# Patient Record
Sex: Male | Born: 1955
Health system: Southern US, Community
[De-identification: ages and names within clinical notes are randomized; demographics above are authoritative.]

## PROBLEM LIST (undated history)

## (undated) DIAGNOSIS — K219 Gastro-esophageal reflux disease without esophagitis: Secondary | ICD-10-CM

## (undated) DIAGNOSIS — I7121 Aneurysm of the ascending aorta, without rupture: Secondary | ICD-10-CM

## (undated) DIAGNOSIS — I712 Thoracic aortic aneurysm, without rupture: Secondary | ICD-10-CM

## (undated) DIAGNOSIS — T7840XA Allergy, unspecified, initial encounter: Secondary | ICD-10-CM

## (undated) DIAGNOSIS — I10 Essential (primary) hypertension: Secondary | ICD-10-CM

## (undated) DIAGNOSIS — F419 Anxiety disorder, unspecified: Secondary | ICD-10-CM

## (undated) DIAGNOSIS — I493 Ventricular premature depolarization: Secondary | ICD-10-CM

## (undated) DIAGNOSIS — R7989 Other specified abnormal findings of blood chemistry: Secondary | ICD-10-CM

## (undated) HISTORY — DX: Gastro-esophageal reflux disease without esophagitis: K21.9

## (undated) HISTORY — DX: Aneurysm of the ascending aorta, without rupture: I71.21

## (undated) HISTORY — DX: Other specified abnormal findings of blood chemistry: R79.89

## (undated) HISTORY — DX: Allergy, unspecified, initial encounter: T78.40XA

## (undated) HISTORY — DX: Ventricular premature depolarization: I49.3

## (undated) HISTORY — DX: Thoracic aortic aneurysm, without rupture: I71.2

## (undated) HISTORY — PX: OTHER SURGICAL HISTORY: SHX169

## (undated) HISTORY — DX: Essential (primary) hypertension: I10

## (undated) HISTORY — DX: Anxiety disorder, unspecified: F41.9

## (undated) HISTORY — PX: CHOLECYSTECTOMY: SHX55

---

## 2003-11-02 ENCOUNTER — Emergency Department (HOSPITAL_COMMUNITY): Admission: EM | Admit: 2003-11-02 | Discharge: 2003-11-03 | Payer: Self-pay | Admitting: Emergency Medicine

## 2004-09-01 ENCOUNTER — Emergency Department (HOSPITAL_COMMUNITY): Admission: EM | Admit: 2004-09-01 | Discharge: 2004-09-01 | Payer: Self-pay | Admitting: Emergency Medicine

## 2004-10-16 ENCOUNTER — Emergency Department (HOSPITAL_COMMUNITY): Admission: EM | Admit: 2004-10-16 | Discharge: 2004-10-17 | Payer: Self-pay | Admitting: Emergency Medicine

## 2004-10-21 ENCOUNTER — Encounter: Admission: RE | Admit: 2004-10-21 | Discharge: 2004-10-21 | Payer: Self-pay | Admitting: Family Medicine

## 2004-11-04 ENCOUNTER — Observation Stay (HOSPITAL_COMMUNITY): Admission: RE | Admit: 2004-11-04 | Discharge: 2004-11-05 | Payer: Self-pay | Admitting: General Surgery

## 2006-12-24 IMAGING — CT CT ABDOMEN WO/W CM
1 of 5 series · 12 of 32 positions shown, 18 images · IV contrast (REDICAT/H/2O & OMNIPAQUE  [ID])
Comparison: none

CLINICAL DATA: 49 year old with liver lesion seen on recent ultrasound.
ABDOMEN CT WITHOUT AND WITH CONTRAST:
TECHNIQUE: Multidetector CT imaging of the abdomen was performed both before and during bolus administration of intravenous contrast.
Contrast:  100 cc of Omnipaque 300.
Precontrast, post contrast arterial phase, and portal venous phase imaging was performed of the abdomen.   Study is correlated with the ultrasound examination of 10/16/04.
On the precontrast images, there are three or four small low attenuation lesions in the liver.  These demonstrate no contrast enhancement and are consistent with benign hepatic cysts.  In the region of abnormality on ultrasound, I do not see any abnormality on the precontrast images.  On the arterial phase, there is a blush of contrast enhancement in this area which is somewhat ill-defined.  I think it is faintly persistent on the portal venous phase images.  It may represent flash filling of the hemangioma.  It is fairly well seen on the ultrasound and, at this point, I would just recommend a follow up ultrasound examination in 4-6 months to document stability. The gallstones seen on the ultrasound are not well demonstrated on the CT.  There is no evidence for pericholecystic inflammatory change.  There is a hyperdense cyst associated with the upper pole region of the right kidney.  It measures approximately 30 Hounsfield units on the precontrast study and 30 Hounsfield units on the arterial and portal venous phase images.  Looking back at the ultrasound, this is difficult to see.
The pancreas, adrenal glands, spleen, and left kidney are unremarkable.  No abdominal masses or adenopathy. The stomach, duodenum, small bowel, and colon demonstrate no significant abnormalities.

[Series 3: abd w/cm · axial · 0.70mm/px · z∈[-270,+0]mm · 12 of 103 slices shown, 18 images]
[im 8/103  soft-tissue]
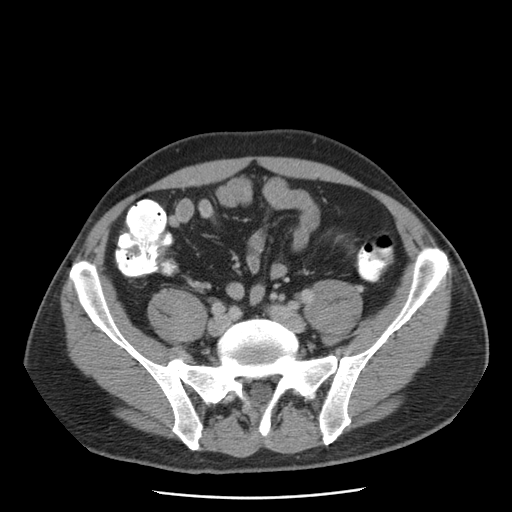
[im 8/103  bone]
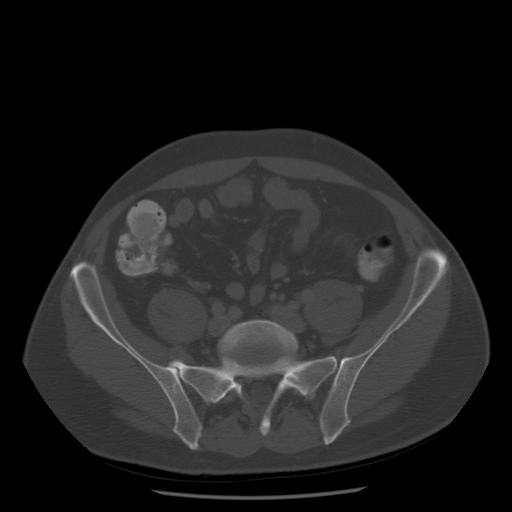
[im 16/103  soft-tissue]
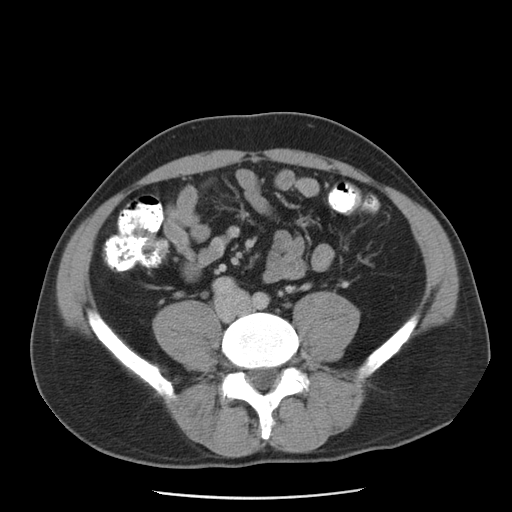
[im 24/103  soft-tissue]
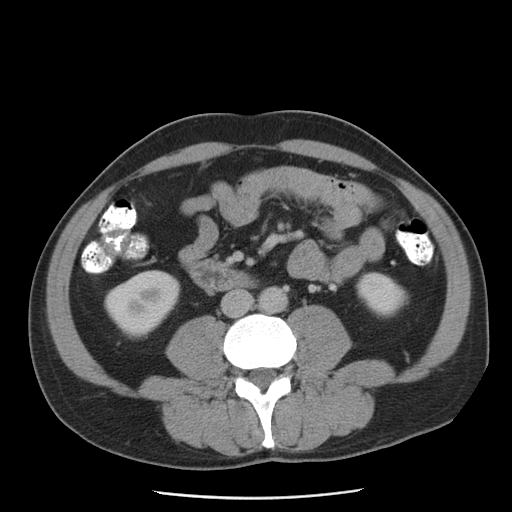
[im 32/103  soft-tissue]
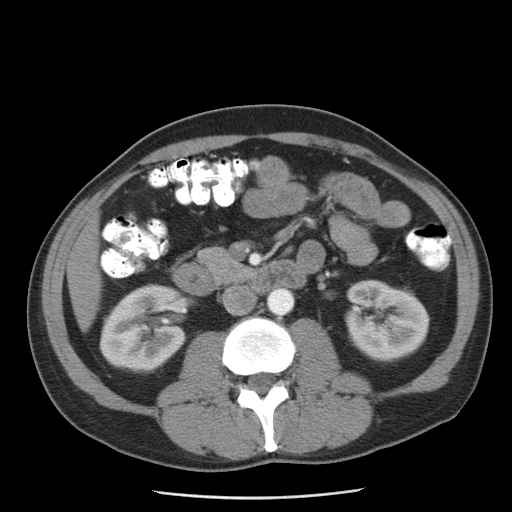
[im 40/103  soft-tissue]
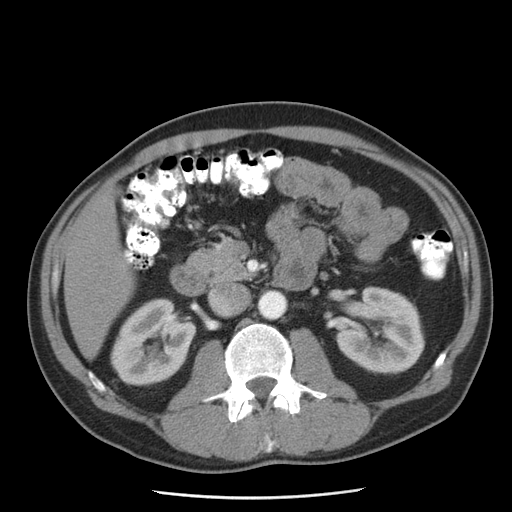
[im 48/103  soft-tissue]
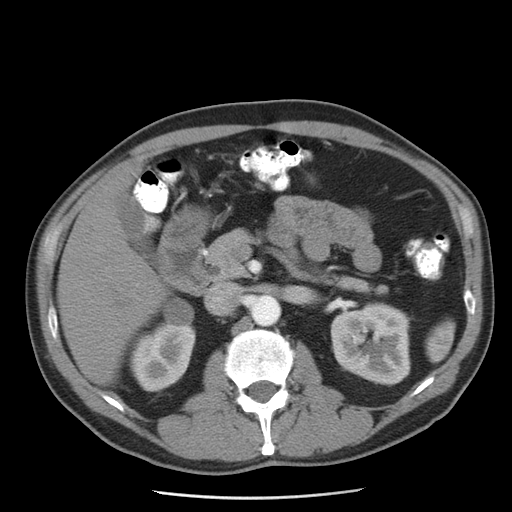
[im 55/103  soft-tissue]
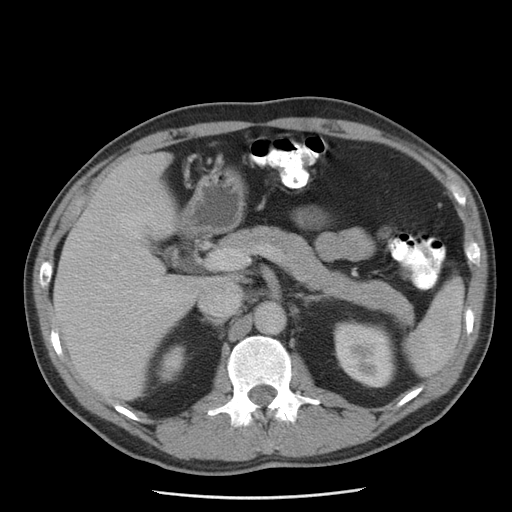
[im 63/103  soft-tissue]
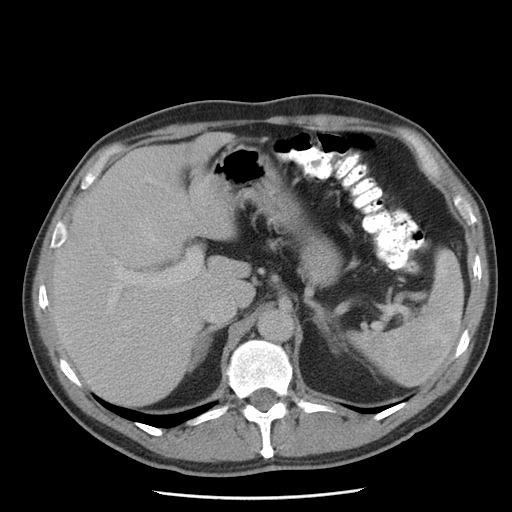
[im 71/103  soft-tissue]
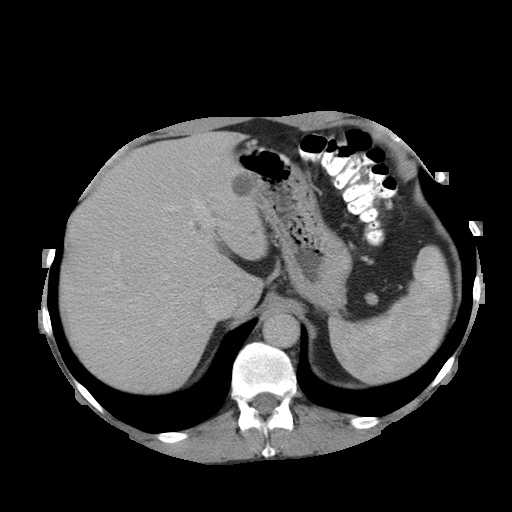
[im 71/103  lung]
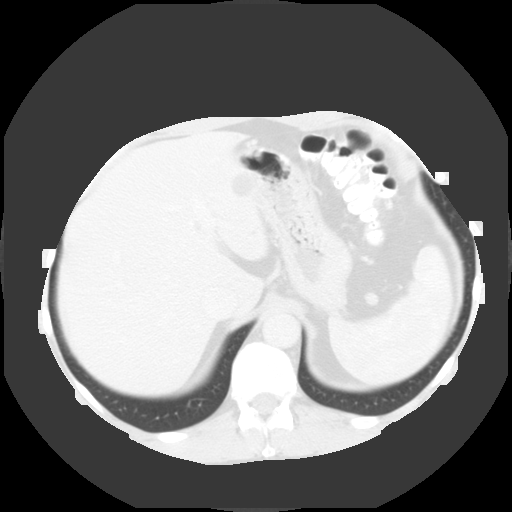
[im 71/103  bone]
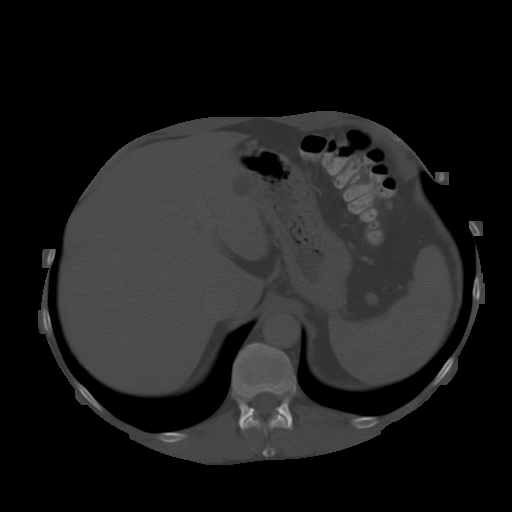
[im 79/103  soft-tissue]
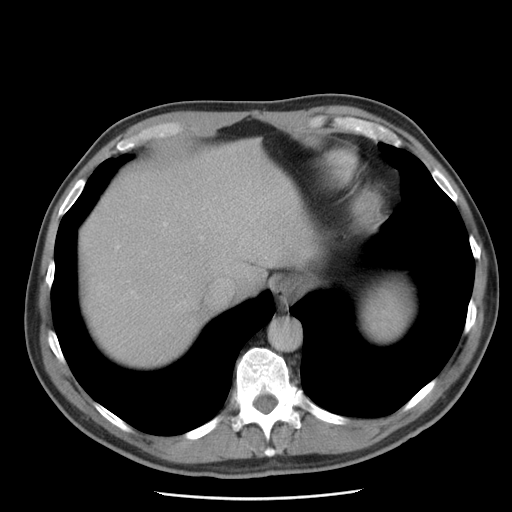
[im 79/103  lung]
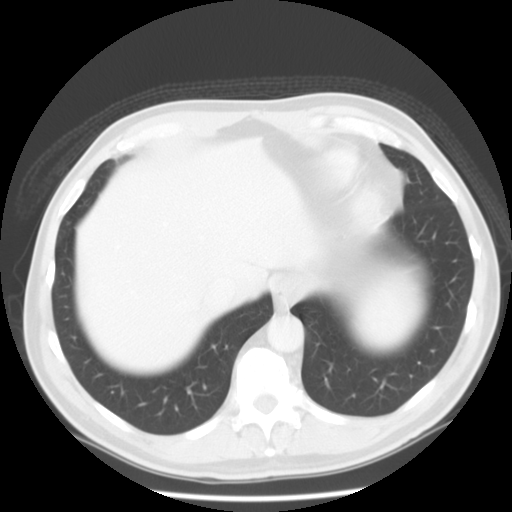
[im 87/103  soft-tissue]
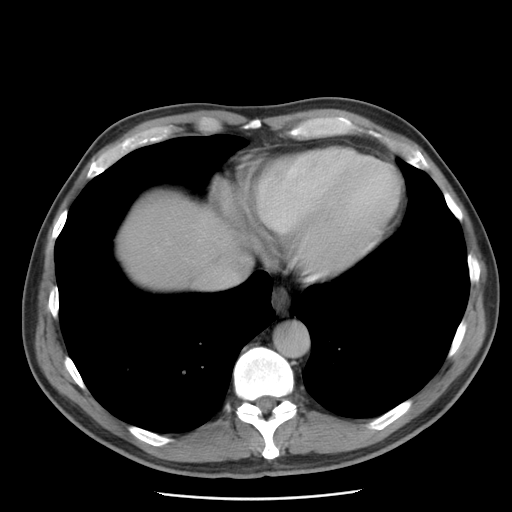
[im 87/103  lung]
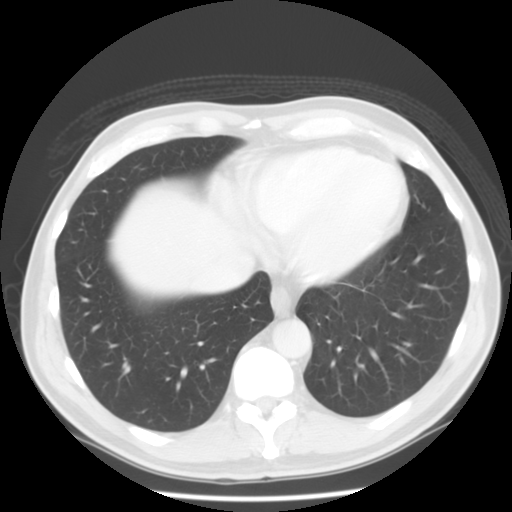
[im 95/103  soft-tissue]
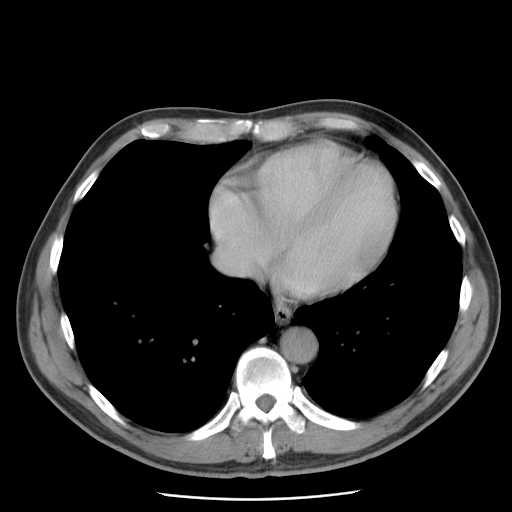
[im 95/103  lung]
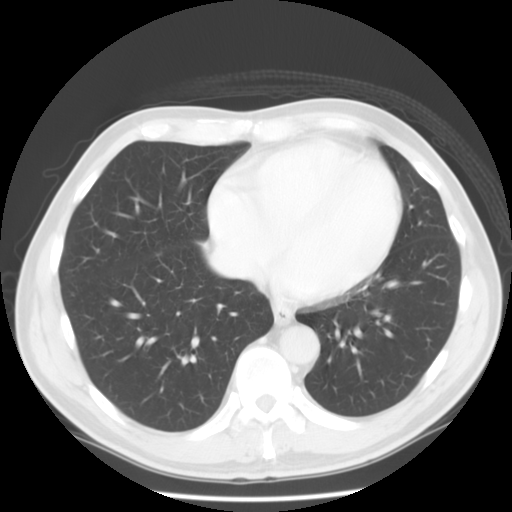

[12 of 32 positions shown; findings below may reference images not displayed]

IMPRESSION: 1.  Vague area of arterial enhancement in the same region of the small lesion seen on ultrasound. This may represent flash filling of a hemangioma.  I would recommend a follow up ultrasound examination of 4-6 months to document stability.  There are also at least five or six small hepatic cysts.
2.  Hyperdense/hemorrhagic cyst associated with the upper pole region of the right kidney.
3.  No other significant findings in the abdomen.

## 2015-09-01 ENCOUNTER — Encounter: Payer: Self-pay | Admitting: Cardiology

## 2015-09-01 ENCOUNTER — Ambulatory Visit (INDEPENDENT_AMBULATORY_CARE_PROVIDER_SITE_OTHER): Payer: Managed Care, Other (non HMO) | Admitting: Cardiology

## 2015-09-01 ENCOUNTER — Encounter: Payer: Self-pay | Admitting: *Deleted

## 2015-09-01 VITALS — BP 100/64 | HR 63 | Ht 71.0 in | Wt 175.0 lb

## 2015-09-01 DIAGNOSIS — Z136 Encounter for screening for cardiovascular disorders: Secondary | ICD-10-CM

## 2015-09-01 DIAGNOSIS — I77810 Thoracic aortic ectasia: Secondary | ICD-10-CM | POA: Diagnosis not present

## 2015-09-01 DIAGNOSIS — E785 Hyperlipidemia, unspecified: Secondary | ICD-10-CM

## 2015-09-01 DIAGNOSIS — I10 Essential (primary) hypertension: Secondary | ICD-10-CM

## 2015-09-01 DIAGNOSIS — I712 Thoracic aortic aneurysm, without rupture: Secondary | ICD-10-CM

## 2015-09-01 DIAGNOSIS — I7121 Aneurysm of the ascending aorta, without rupture: Secondary | ICD-10-CM

## 2015-09-01 NOTE — Patient Instructions (Signed)
Medication Instructions:  Continue all current medications.  Labwork: NONE  Testing/Procedures: Your physician has requested that you have an echocardiogram. Echocardiography is a painless test that uses sound waves to create images of your heart. It provides your doctor with information about the size and shape of your heart and how well your heart's chambers and valves are working. This procedure takes approximately one hour. There are no restrictions for this procedure. - DUE JUST PRIOR TO NEXT OFFICE VISIT.   Follow-Up: Your physician wants you to follow up in: 6 months.  You will receive a reminder letter in the mail one-two months in advance.  If you don't receive a letter, please call our office to schedule the follow up appointment    If you need a refill on your cardiac medications before your next appointment, please call your pharmacy.

## 2015-09-01 NOTE — Progress Notes (Signed)
Cardiology Office Note  Date: 09/01/2015   ID: Tim HeadingsJames C Ruby, DOB [birth year], MRN [id number]  PCP: Ernestine ConradBLUTH, KIRK, MD  Consulting Cardiologist: Nona DellSamuel Adya Wirz, MD   Chief Complaint  Patient presents with  . Ascending aortic aneurysm    History of Present Illness: Tim Rogers is a [age] y.o. male referred for cardiology consultation by Dr. Loney HeringBluth. [place] from [place] back in [place]. [place]. He has a history of aortic root and ascending aortic dilatation and was following with a cardiologist in [place] for the last several years.  Patient previously followed with Dr. Katharina CaperAndre Landau of [facility] in [place], [place]. He has a history of ascending aortic root dilatation and aneurysmal change, echocardiogram reporting 4.63 cm at the Sinuses of Valsalva and 4.5 cm in the ascending aorta as of December 2015. He reportedly had a chest CT in November 2016, I do not have these results for review.  He presents today to establish ongoing cardiology follow-up. Reports no symptoms with exertion, specifically no angina, NYHA class I dyspnea, no palpitations or syncope. He states that he is very active doing chores [place] his [place], enjoys walking for exercise. He does not lift weights for exercise since his diagnosis of aortic root dilatation. Tries to manage stress and keep his blood pressure down.  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  He reports stable medical regimen, has been on Norvasc for about 5 years. ECG today reviewed and shows normal sinus rhythm.  It sounds like he has been having echocardiograms alternating with chest CT for follow-up surveillance of his aortic size.  Past Medical History  Diagnosis Date  . Essential hypertension   . Hyperlipidemia   . Ascending aortic aneurysm (HCC)   .  Low testosterone   . PVCs (premature ventricular contractions)   . Anxiety     Past Surgical History  Procedure Laterality Date  . Cholecystectomy      Current Outpatient Prescriptions  Medication Sig Dispense Refill  . amLODipine (NORVASC) 10 MG tablet Take 10 mg by mouth daily.    . pantoprazole (PROTONIX) 20 MG tablet Take 20 mg by mouth daily.     No current facility-administered medications for this visit.   Allergies:  Review of patient's allergies indicates no known allergies.   Social History: The patient  reports that he has never smoked. He has never used smokeless tobacco. He reports that he drinks alcohol. He reports that he does not use illicit drugs.   Family History: The patient's family history includes COPD in his father.   ROS:  Please see the history of present illness. Otherwise, complete review of systems is positive for none.  All other systems are reviewed and negative.   Physical Exam: VS:  BP 100/64 mmHg  Pulse 63  Ht 5\' 11"  (1.803 m)  Wt 175 lb (79.379 kg)  BMI 24.42 kg/m2  SpO2 96%, BMI Body mass index is 24.42 kg/(m^2).  Wt Readings from Last 3 Encounters:  09/01/15 175 lb (79.379 kg)  02/01/15 182 lb (82.555 kg)    General: Patient appears comfortable at rest. HEENT: Conjunctiva and lids normal, oropharynx clear. Neck: Supple, no elevated JVP or carotid bruits, no thyromegaly. Lungs: Clear to auscultation, nonlabored breathing at rest. Cardiac: Regular rate and rhythm, no S3 or significant systolic murmur, no pericardial rub. Abdomen:  Soft, nontender, bowel sounds present, no guarding or rebound. Extremities: No pitting edema, distal pulses 2+. Skin: Warm and dry. Musculoskeletal: No kyphosis. Neuropsychiatric: Alert and oriented x3, affect grossly appropriate.  ECG: I personally reviewed the tracing from 01/30/2014 which showed normal sinus rhythm with nonspecific T-wave changes.  Recent Labwork:  June 2016: BUN 14, creatinine 1.1,  potassium 4.3, AST 21, ALT 20, hemoglobin 14.9, platelet is 215  Other Studies Reviewed Today:  Echocardiogram 02/06/2014 [facility]([place]): Mild basal septal LV hypertrophy with LVEF 60%, abnormal diastolic dysfunction, mild mitral regurgitation, mildly sclerotic aortic valve with mild aortic regurgitation, mild tricuspid regurgitation with PASP 30 mmHg, aortic root dilatation measuring 4.63 cm at the Sinuses of Valsalva and 4.5 cm in the ascending aorta.  Exercise echocardiogram 02/07/2012 [facility]([place]): No diagnostic ST segment changes at at 13 minutes, stage V, no chest pain reported, 93% MPHR. No echocardiographic evidence of ischemia.  Assessment and Plan:  1. Aneurysmal dilatation of the aortic root and ascending aorta based on workup as outlined above. This has been stable based on records, I am requesting the report of the chest CT from November 2016 which would be his most recent study. He is asymptomatic at this time. No changes made to present regimen. I encouraged continued aerobic activity and efforts at blood pressure control. We will plan a follow-up echocardiogram in 6 months with clinical visit at that time.  2. Essential hypertension, blood pressure is well controlled today.  3. Low testosterone level. He does use to stop struck supplements, provided by PCP.  4. History of hyperlipidemia, managed by diet.  Current medicines were reviewed with the patient today.   Orders Placed This Encounter  Procedures  . EKG 12-Lead  . ECHOCARDIOGRAM COMPLETE    Disposition: Follow-up with me in 6 months to review echocardiogram.  Signed, Jonelle SidleSamuel G. Shantina Chronister, MD, [facility] 09/01/2015 9:03 AM    [facility] [address], [place], [place] [address] Phone: [phone]) [phone]; Fax: [phone]

## 2016-01-28 ENCOUNTER — Telehealth: Payer: Self-pay | Admitting: *Deleted

## 2016-01-28 ENCOUNTER — Other Ambulatory Visit: Payer: Self-pay

## 2016-01-28 ENCOUNTER — Ambulatory Visit (INDEPENDENT_AMBULATORY_CARE_PROVIDER_SITE_OTHER): Payer: Managed Care, Other (non HMO)

## 2016-01-28 DIAGNOSIS — I77810 Thoracic aortic ectasia: Secondary | ICD-10-CM | POA: Diagnosis not present

## 2016-01-28 NOTE — Telephone Encounter (Signed)
-----   Message from Jonelle SidleSamuel G McDowell, MD sent at 01/28/2016 10:04 AM EST ----- Results reviewed. Aortic root size reported 45 mm which would be in line with his prior assessment in [place]. We will discuss further office follow-up. A copy of this test should be forwarded to Ernestine ConradBLUTH, KIRK, MD.

## 2016-01-28 NOTE — Telephone Encounter (Signed)
Patient informed and copy sent to PCP. 

## 2016-03-16 ENCOUNTER — Encounter: Payer: Self-pay | Admitting: *Deleted

## 2016-03-17 ENCOUNTER — Ambulatory Visit: Payer: Managed Care, Other (non HMO) | Admitting: Cardiology

## 2016-03-18 ENCOUNTER — Ambulatory Visit: Payer: Managed Care, Other (non HMO) | Admitting: Cardiology

## 2016-04-21 ENCOUNTER — Ambulatory Visit (INDEPENDENT_AMBULATORY_CARE_PROVIDER_SITE_OTHER): Payer: Managed Care, Other (non HMO) | Admitting: Cardiology

## 2016-04-21 ENCOUNTER — Encounter: Payer: Self-pay | Admitting: Cardiology

## 2016-04-21 VITALS — BP 118/83 | HR 69 | Ht 71.0 in | Wt 183.6 lb

## 2016-04-21 DIAGNOSIS — I10 Essential (primary) hypertension: Secondary | ICD-10-CM

## 2016-04-21 DIAGNOSIS — I77810 Thoracic aortic ectasia: Secondary | ICD-10-CM | POA: Diagnosis not present

## 2016-04-21 NOTE — Progress Notes (Signed)
Cardiology Office Note  Date: 04/21/2016   ID: STORM SOVINE, DOB 1955/04/23, MRN [id number]  PCP: Celedonio Savage, MD  Primary Cardiologist: Rozann Lesches, MD   Chief Complaint  Patient presents with  . Aortic root dilatation    History of Present Illness: Tim Rogers is a 61 y.o. male that I met back in July 2017. He presents for routine follow-up. Remains asymptomatic, no chest pain, shortness of breath, palpitations, or syncope. He states that he is much less stressed living in this area.  Patient previously followed with Dr. Jeannie Fend of [facility] in [place], [place]. He has a history of ascending aortic root dilatation and aneurysmal change, echocardiogram reporting 4.63 cm at the Sinuses of Valsalva and 4.5 cm in the ascending aorta as of December 2015. Chest CT in October 2016 reported aortic root measuring 4.3 cm, ascending aorta 4.1 cm.  Follow-up echocardiogram from November 2017 is outlined below. I discussed the results with him today.  Past Medical History:  Diagnosis Date  . Anxiety   . Ascending aortic aneurysm ([place])   . Essential hypertension   . Hyperlipidemia   . Low testosterone   . PVCs (premature ventricular contractions)     Current Outpatient Prescriptions  Medication Sig Dispense Refill  . amLODipine (NORVASC) 10 MG tablet Take 10 mg by mouth daily.    . pantoprazole (PROTONIX) 20 MG tablet Take 20 mg by mouth daily.     No current facility-administered medications for this visit.    Allergies:  Patient has no known allergies.   Social History: The patient  reports that he has never smoked. He has never used smokeless tobacco. He reports that he drinks alcohol. He reports that he does not use drugs.   ROS:  Please see the history of present illness. Otherwise, complete review of systems is positive for none.  All other systems are reviewed and negative.   Physical Exam: VS:  BP 118/83   Pulse 69   Ht 5'  11" (1.803 m)   Wt 183 lb 9.6 oz (83.3 kg)   SpO2 98%   BMI 25.61 kg/m , BMI Body mass index is 25.61 kg/m.  Wt Readings from Last 3 Encounters:  04/21/16 183 lb 9.6 oz (83.3 kg)  09/01/15 175 lb (79.4 kg)  02/01/15 182 lb (82.6 kg)    General: Patient appears comfortable at rest. HEENT: Conjunctiva and lids normal, oropharynx clear. Neck: Supple, no elevated JVP or carotid bruits, no thyromegaly. Lungs: Clear to auscultation, nonlabored breathing at rest. Cardiac: Regular rate and rhythm, no S3 or significant systolic murmur, no pericardial rub. Abdomen: Soft, nontender, bowel sounds present. Extremities: No pitting edema, distal pulses 2+.  ECG: I personally reviewed the tracing from 09/01/2015 which showed normal sinus rhythm.  Recent Labwork:  June 2016: BUN 14, creatinine 1.1, potassium 4.3, AST 21, ALT 20, hemoglobin 14.9, platelet is 215  Other Studies Reviewed Today:  Echocardiogram 01/28/2016: Study Conclusions  - Left ventricle: The cavity size was normal. Wall thickness was   increased in a pattern of mild LVH. Systolic function was normal.   The estimated ejection fraction was in the range of 60% to 65%.   Wall motion was normal; there were no regional wall motion   abnormalities. Doppler parameters are consistent with abnormal   left ventricular relaxation (grade 1 diastolic dysfunction). - Aortic valve: There was mild regurgitation. Valve area (VTI):   4.28 cm^2. Valve area (Vmax): 3.86 cm^2. Valve area (  Vmean): 4.15   cm^2. - Aorta: Aortic root dimension: 45 mm (ED). - Technically adequate study.  Assessment and Plan:  1. Aortic root dilatation, 45 mm by most recent echocardiogram, and relatively stable over time by different modalities. He is asymptomatic at this point. Plan will be to follow-up with an echocardiogram in November of this year, and if continues to be stable we may cut back surveillance from there.  2. Essential hypertension, on Norvasc  with good blood pressure control noted today.  Current medicines were reviewed with the patient today.   Orders Placed This Encounter  Procedures  . ECHOCARDIOGRAM COMPLETE    Disposition: Follow-up in November with repeat echocardiogram.  Signed, Satira Sark, MD, [facility] 04/21/2016 2:32 PM    [facility] [place], [place], [place] [address] Phone: [phone]; Fax: [phone]

## 2016-04-21 NOTE — Patient Instructions (Signed)
Your physician wants you to follow-up in: November with Dr. Diona BrownerMcDowell. You will receive a reminder letter in the mail two months in advance. If you don't receive a letter, please call our office to schedule the follow-up appointment.   Your physician has requested that you have an echocardiogram. Echocardiography is a painless test that uses sound waves to create images of your heart. It provides your doctor with information about the size and shape of your heart and how well your heart's chambers and valves are working. This procedure takes approximately one hour. There are no restrictions for this procedure.   Your physician recommends that you continue on your current medications as directed. Please refer to the Current Medication list given to you today.     Thank you for choosing [facility]!!

## 2016-08-02 ENCOUNTER — Encounter (INDEPENDENT_AMBULATORY_CARE_PROVIDER_SITE_OTHER): Payer: Self-pay | Admitting: *Deleted

## 2016-08-10 ENCOUNTER — Encounter (INDEPENDENT_AMBULATORY_CARE_PROVIDER_SITE_OTHER): Payer: Self-pay | Admitting: Internal Medicine

## 2016-08-16 ENCOUNTER — Encounter (INDEPENDENT_AMBULATORY_CARE_PROVIDER_SITE_OTHER): Payer: Self-pay | Admitting: Internal Medicine

## 2016-08-16 ENCOUNTER — Ambulatory Visit (INDEPENDENT_AMBULATORY_CARE_PROVIDER_SITE_OTHER): Payer: Managed Care, Other (non HMO) | Admitting: Internal Medicine

## 2016-08-16 VITALS — BP 108/80 | HR 60 | Temp 97.0°F | Ht 71.0 in | Wt 183.5 lb

## 2016-08-16 DIAGNOSIS — K219 Gastro-esophageal reflux disease without esophagitis: Secondary | ICD-10-CM | POA: Diagnosis not present

## 2016-08-16 NOTE — Patient Instructions (Addendum)
Continue the Protonix. Will get last 2 colonoscopies from [place].

## 2016-08-16 NOTE — Progress Notes (Addendum)
   Subjective:    Patient ID: Tim Rogers, male    DOB: [birth year], 61 y.o.   MRN: [id number]  HPI Referred by Dr. Loney HeringBluth for screening colonoscopy/GERD.  Patient denies hx of colon polyps.  He states his acid reflux is good most days. Some days he may have acid reflux. Sometimes he has some discomfort in his epigastric region. His appetite is good.  No weight loss.  He states he is proactive.  Usually has a BM daily.  No melena or BRRB.     01/05/2016 H and H 17.1 and 49.0, MCV 80.4    Hx of transoral fundoplication by Dr. Lenord Fellersoren in [place].      08/29/2012 EGD: Dyspepsia, bloating, early satiety. Dr. Wilma FlavinBloom, [place]. Small papilloma upper third removed with biopsy. Two nylon suture tips protruding from left anterior Z line. Small hole opposite wall at Z line. Non erosive gastritis.. Duodenitis in the duodenum.  Biopsy: Benign body of stomach biopsy with no H. Pylori   08/29/2012 Colonoscopy: Average risk and colonic polyps: Small internal and external hemorrhoids were found during the rectal exam. Two sessile polyps found in the descending colon and rectosigmoid junction. Biopsy:Hyperplastic polyp in descending and rectosigmoid colon.    Review of Systems Past Medical History:  Diagnosis Date  . Anxiety   . Ascending aortic aneurysm (HCC)   . Essential hypertension   . Hyperlipidemia   . Low testosterone   . PVCs (premature ventricular contractions)     Past Surgical History:  Procedure Laterality Date  . CHOLECYSTECTOMY      No Known Allergies  Current Outpatient Prescriptions on File Prior to Visit  Medication Sig Dispense Refill  . amLODipine (NORVASC) 10 MG tablet Take 10 mg by mouth daily.    . pantoprazole (PROTONIX) 20 MG tablet Take 20 mg by mouth daily.     No current facility-administered medications on file prior to visit.         Objective:   Physical Exam  Blood pressure 108/80, pulse 60, temperature 97 F (36.1 C), height 5\' 11"  (1.803 m), weight  183 lb 8 oz (83.2 kg).   Alert and oriented. Skin warm and dry. Oral mucosa is moist.   . Sclera anicteric, conjunctivae is pink. Thyroid not enlarged. No cervical lymphadenopathy. Lungs clear. Heart regular rate and rhythm.  Abdomen is soft. Bowel sounds are positive. No hepatomegaly. No abdominal masses felt. No tenderness.  No edema to lower extremities.      Assessment & Plan:  GERD. Continue the Protonix. Colon polyps: Hyperplastic. Will get colonoscopy form 2009. patient denies prior hx of colon polyps. Next colonoscopy will be in 2024.

## 2016-09-03 ENCOUNTER — Encounter (INDEPENDENT_AMBULATORY_CARE_PROVIDER_SITE_OTHER): Payer: Self-pay

## 2016-10-01 ENCOUNTER — Encounter: Payer: Self-pay | Admitting: Family Medicine

## 2016-10-01 ENCOUNTER — Ambulatory Visit: Payer: Managed Care, Other (non HMO) | Admitting: Family Medicine

## 2016-10-01 ENCOUNTER — Ambulatory Visit (INDEPENDENT_AMBULATORY_CARE_PROVIDER_SITE_OTHER): Payer: Managed Care, Other (non HMO) | Admitting: Family Medicine

## 2016-10-01 VITALS — BP 118/62 | HR 72 | Temp 97.3°F | Resp 16 | Ht 71.25 in | Wt 184.2 lb

## 2016-10-01 DIAGNOSIS — I7121 Aneurysm of the ascending aorta, without rupture: Secondary | ICD-10-CM | POA: Insufficient documentation

## 2016-10-01 DIAGNOSIS — Z9109 Other allergy status, other than to drugs and biological substances: Secondary | ICD-10-CM

## 2016-10-01 DIAGNOSIS — B009 Herpesviral infection, unspecified: Secondary | ICD-10-CM | POA: Diagnosis not present

## 2016-10-01 DIAGNOSIS — E291 Testicular hypofunction: Secondary | ICD-10-CM | POA: Diagnosis not present

## 2016-10-01 DIAGNOSIS — F410 Panic disorder [episodic paroxysmal anxiety] without agoraphobia: Secondary | ICD-10-CM

## 2016-10-01 DIAGNOSIS — K219 Gastro-esophageal reflux disease without esophagitis: Secondary | ICD-10-CM | POA: Diagnosis not present

## 2016-10-01 DIAGNOSIS — I10 Essential (primary) hypertension: Secondary | ICD-10-CM | POA: Insufficient documentation

## 2016-10-01 DIAGNOSIS — I714 Abdominal aortic aneurysm, without rupture, unspecified: Secondary | ICD-10-CM | POA: Insufficient documentation

## 2016-10-01 DIAGNOSIS — B0089 Other herpesviral infection: Secondary | ICD-10-CM

## 2016-10-01 HISTORY — DX: Essential (primary) hypertension: I10

## 2016-10-01 NOTE — Progress Notes (Signed)
Chief Complaint  Patient presents with  . AAA  . Allergies   New patient, his doctor died unexpectedly. He sees cardiology for a AAA.  Not changing over the years He sees GI for GERD, controlled on protonix He [place] to [place] wellness [place] called [facility] for testosterone replacement therapy.  He goes every couple of months.  He has testosterone pellets implanted sub cut into his buttocks to maintain his T level.  They do screen for lipids, Hct and PSA. He has a recurrent rash on his buttocks that is HSV positive on testing, has valtrex for prn use His colon cancer screening is up to date every 5 years He has had all of his immunizations, and will go for shingles Regular eye exams regular dental care  Patient Active Problem List   Diagnosis Date Noted  . Hypogonadism in male 10/01/2016  . Environmental allergies 10/01/2016  . Chronic GERD 10/01/2016  . Hypertension 10/01/2016  . AAA (abdominal aortic aneurysm) without rupture (HCC) 10/01/2016  . 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 ALPRAZolam (XANAX) 0.5 MG tablet Take 0.5 mg by mouth 2 (two) times daily as needed for anxiety.  Marland Kitchen. amLODipine (NORVASC) 10 MG tablet Take 10 mg by mouth daily.  Marland Kitchen. azelastine (ASTELIN) 0.1 % nasal spray   . Multiple Vitamins-Minerals (MULTIVITAMIN ADULT PO) Take by mouth.  . pantoprazole (PROTONIX) 20 MG tablet Take 20 mg by mouth daily.  Marland Kitchen. UNABLE TO FIND Testosterone implant   No facility-administered encounter medications on file as of 10/01/2016.     Past Medical History:  Diagnosis Date  . Anxiety    2003  . Ascending aortic aneurysm (HCC)   . GERD (gastroesophageal reflux disease)   . Hypertension 10/01/2016  . Low testosterone   . PVCs (premature ventricular contractions)     Past Surgical History:  Procedure Laterality Date  . CHOLECYSTECTOMY    . TIF procedure       Social History   Social History  . Marital status: Single    Spouse name: N/A  . Number of children: 0  . Years of education: 3218   Occupational History  . retired    Social History Main Topics  . Smoking status: Never Smoker  . Smokeless tobacco: Current User    Types: Snuff  . Alcohol use 3.0 oz/week    5 Cans of beer per week     Comment: couple of beer sometimes daily  . Drug use: No  . Sexual activity: Not Currently    Birth control/ protection: None   Other Topics Concern  . Not on file   Social History Narrative   Retired   Lives alone   Visual merchandiserarmer- one acre garden   Has chickens and turkeys and dogs as Geneticist, molecularpet   [employer] Scientific laboratory technicianhunter   Online professor       Family History  Problem Relation Age of Onset  . Alcohol abuse Mother   . Cancer Mother        lung  . COPD Mother   . Heart disease Mother   . Early death Mother        age 62 multiple factors  . Cancer Sister        breast    Review of Systems  Constitutional: Negative for chills, fever and weight loss.  HENT: Negative for congestion and hearing loss.  Eyes: Negative for blurred vision and pain.  Respiratory: Negative for cough and shortness of breath.   Cardiovascular: Negative for chest pain and leg swelling.  Gastrointestinal: Negative for abdominal pain, constipation, diarrhea and heartburn.  Genitourinary: Negative for dysuria and frequency.  Musculoskeletal: Negative for falls, joint pain and myalgias.  Neurological: Negative for dizziness, seizures and headaches.  Psychiatric/Behavioral: Negative for depression. The patient is nervous/anxious. The patient does not have insomnia.        Sometimes has anxiety.  Takes about 3 xanax a year    BP 118/62 (BP Location: Left Arm, Patient Position: Sitting, Cuff Size: Normal)   Pulse 72   Temp (!) 97.3 F (36.3 C) (Other (Comment))   Resp 16   Ht 5' 11.25" (1.81 m)   Wt 184 lb 4 oz (83.6 kg)   SpO2 98%   BMI 25.52 kg/m   Physical  Exam  Constitutional: He is oriented to person, place, and time. He appears well-developed and well-nourished.  HENT:  Head: Normocephalic and atraumatic.  Mouth/Throat: Oropharynx is clear and moist.  Eyes: Pupils are equal, round, and reactive to light. Conjunctivae are normal.  Neck: Normal range of motion. Neck supple. No thyromegaly present.  Cardiovascular: Normal rate, regular rhythm and normal heart sounds.   Pulmonary/Chest: Effort normal and breath sounds normal. No respiratory distress.  Abdominal: Soft. Bowel sounds are normal.  Musculoskeletal: Normal range of motion. He exhibits no edema.  Lymphadenopathy:    He has no cervical adenopathy.  Neurological: He is alert and oriented to person, place, and time.  Gait normal  Skin: Skin is warm and dry.  Psychiatric: He has a normal mood and affect. His behavior is normal. Thought content normal.  Nursing note and vitals reviewed.  ASSESSMENT/PLAN:  1. Hypogonadism in male treated  2. Environmental allergies OTC zyrted prn a nd alstelin prn  3. Panic disorder Controlled with meditation and rare xanax  4. Chronic GERD On protonix  5. Essential hypertension Controlled, BP kept low to protect aneurysm  6. AAA (abdominal aortic aneurysm) without rupture [facility](HCC) Followed cardiology  7. 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

## 2016-10-01 NOTE — Patient Instructions (Addendum)
Need records Dr Loney HeringBluth Need records Dr Wyline MoodWeiner  See me in the fall for a physical and immunization update

## 2016-10-08 ENCOUNTER — Encounter: Payer: Self-pay | Admitting: Family Medicine

## 2016-10-08 DIAGNOSIS — D751 Secondary polycythemia: Secondary | ICD-10-CM | POA: Insufficient documentation

## 2016-10-08 DIAGNOSIS — R7611 Nonspecific reaction to tuberculin skin test without active tuberculosis: Secondary | ICD-10-CM | POA: Insufficient documentation

## 2017-01-12 ENCOUNTER — Other Ambulatory Visit: Payer: Self-pay

## 2017-01-12 ENCOUNTER — Ambulatory Visit (INDEPENDENT_AMBULATORY_CARE_PROVIDER_SITE_OTHER): Payer: 59

## 2017-01-12 DIAGNOSIS — I77810 Thoracic aortic ectasia: Secondary | ICD-10-CM

## 2017-01-13 ENCOUNTER — Telehealth: Payer: Self-pay

## 2017-01-13 NOTE — Telephone Encounter (Signed)
Patient notified. Routed to PCP 

## 2017-01-13 NOTE — Telephone Encounter (Signed)
-----   Message from Eustace MooreLydia M Anderson, LPN sent at [id number]/14/2018  4:47 PM EST -----   ----- Message ----- From: Jonelle SidleMcDowell, Samuel G, MD Sent: 01/12/2017   3:14 PM To: Eustace MooreLydia M Anderson, LPN, Eustace MooreYvonne Sue Nelson, MD  Results reviewed.  LVEF normal range 60-65%.  Aortic root size is moderately dilated as before, 46 mm is relatively stable.  Would plan a follow-up echocardiogram in 1 year. A copy of this test should be forwarded to Eustace MooreNelson, Yvonne Sue, MD.

## 2017-01-29 ENCOUNTER — Other Ambulatory Visit: Payer: Self-pay | Admitting: Cardiology

## 2017-01-31 ENCOUNTER — Telehealth: Payer: Self-pay | Admitting: Cardiology

## 2017-01-31 NOTE — Telephone Encounter (Signed)
Patient was due in November. Appt scheduled for 03/04/2017.

## 2017-01-31 NOTE — Telephone Encounter (Signed)
Mr. Dorothey BasemanStrickland came by the office stating that he was told by the pharmacy that he needs to have an appointment with Dr. Diona BrownerMcDowell.  He is under the impression That he is not due until November 2019.

## 2017-02-14 ENCOUNTER — Ambulatory Visit (INDEPENDENT_AMBULATORY_CARE_PROVIDER_SITE_OTHER): Payer: 59 | Admitting: Internal Medicine

## 2017-02-14 ENCOUNTER — Encounter (INDEPENDENT_AMBULATORY_CARE_PROVIDER_SITE_OTHER): Payer: Self-pay | Admitting: Internal Medicine

## 2017-02-14 ENCOUNTER — Encounter (INDEPENDENT_AMBULATORY_CARE_PROVIDER_SITE_OTHER): Payer: Self-pay

## 2017-02-14 VITALS — BP 102/78 | HR 72 | Temp 98.3°F | Ht 71.0 in | Wt 184.5 lb

## 2017-02-14 DIAGNOSIS — K219 Gastro-esophageal reflux disease without esophagitis: Secondary | ICD-10-CM | POA: Diagnosis not present

## 2017-02-14 NOTE — Patient Instructions (Addendum)
Am going to get Dexilant x 2 weeks. PR in 2 weeks.  Gastroesophageal Reflux Disease, Adult Normally, food travels down the esophagus and stays in the stomach to be digested. However, when a person has gastroesophageal reflux disease (GERD), food and stomach acid move back up into the esophagus. When this happens, the esophagus becomes sore and inflamed. Over time, GERD can create small holes (ulcers) in the lining of the esophagus. What are the causes? This condition is caused by a problem with the muscle between the esophagus and the stomach (lower esophageal sphincter, or LES). Normally, the LES muscle closes after food passes through the esophagus to the stomach. When the LES is weakened or abnormal, it does not close properly, and that allows food and stomach acid to go back up into the esophagus. The LES can be weakened by certain dietary substances, medicines, and medical conditions, including:  Tobacco use.  Pregnancy.  Having a hiatal hernia.  Heavy alcohol use.  Certain foods and beverages, such as coffee, chocolate, onions, and peppermint.  What increases the risk? This condition is more likely to develop in:  People who have an increased body weight.  People who have connective tissue disorders.  People who use NSAID medicines.  What are the signs or symptoms? Symptoms of this condition include:  Heartburn.  Difficult or painful swallowing.  The feeling of having a lump in the throat.  Abitter taste in the mouth.  Bad breath.  Having a large amount of saliva.  Having an upset or bloated stomach.  Belching.  Chest pain.  Shortness of breath or wheezing.  Ongoing (chronic) cough or a night-time cough.  Wearing away of tooth enamel.  Weight loss.  Different conditions can cause chest pain. Make sure to see your health care provider if you experience chest pain. How is this diagnosed? Your health care provider will take a medical history and perform a  physical exam. To determine if you have mild or severe GERD, your health care provider may also monitor how you respond to treatment. You may also have other tests, including:  An endoscopy toexamine your stomach and esophagus with a small camera.  A test thatmeasures the acidity level in your esophagus.  A test thatmeasures how much pressure is on your esophagus.  A barium swallow or modified barium swallow to show the shape, size, and functioning of your esophagus.  How is this treated? The goal of treatment is to help relieve your symptoms and to prevent complications. Treatment for this condition may vary depending on how severe your symptoms are. Your health care provider may recommend:  Changes to your diet.  Medicine.  Surgery.  Follow these instructions at home: Diet  Follow a diet as recommended by your health care provider. This may involve avoiding foods and drinks such as: ? Coffee and tea (with or without caffeine). ? Drinks that containalcohol. ? Energy drinks and sports drinks. ? Carbonated drinks or sodas. ? Chocolate and cocoa. ? Peppermint and mint flavorings. ? Garlic and onions. ? Horseradish. ? Spicy and acidic foods, including peppers, chili powder, curry powder, vinegar, hot sauces, and barbecue sauce. ? Citrus fruit juices and citrus fruits, such as oranges, lemons, and limes. ? Tomato-based foods, such as red sauce, chili, salsa, and pizza with red sauce. ? Fried and fatty foods, such as donuts, french fries, potato chips, and high-fat dressings. ? High-fat meats, such as hot dogs and fatty cuts of red and white meats, such as rib eye  steak, sausage, ham, and bacon. ? High-fat dairy items, such as whole milk, butter, and cream cheese.  Eat small, frequent meals instead of large meals.  Avoid drinking large amounts of liquid with your meals.  Avoid eating meals during the 2-3 hours before bedtime.  Avoid lying down right after you eat.  Do not  exercise right after you eat. General instructions  Pay attention to any changes in your symptoms.  Take over-the-counter and prescription medicines only as told by your health care provider. Do not take aspirin, ibuprofen, or other NSAIDs unless your health care provider told you to do so.  Do not use any tobacco products, including cigarettes, chewing tobacco, and e-cigarettes. If you need help quitting, ask your health care provider.  Wear loose-fitting clothing. Do not wear anything tight around your waist that causes pressure on your abdomen.  Raise (elevate) the head of your bed 6 inches (15cm).  Try to reduce your stress, such as with yoga or meditation. If you need help reducing stress, ask your health care provider.  If you are overweight, reduce your weight to an amount that is healthy for you. Ask your health care provider for guidance about a safe weight loss goal.  Keep all follow-up visits as told by your health care provider. This is important. Contact a health care provider if:  You have new symptoms.  You have unexplained weight loss.  You have difficulty swallowing, or it hurts to swallow.  You have wheezing or a persistent cough.  Your symptoms do not improve with treatment.  You have a hoarse voice. Get help right away if:  You have pain in your arms, neck, jaw, teeth, or back.  You feel sweaty, dizzy, or light-headed.  You have chest pain or shortness of breath.  You vomit and your vomit looks like blood or coffee grounds.  You faint.  Your stool is bloody or black.  You cannot swallow, drink, or eat. This information is not intended to replace advice given to you by your health care provider. Make sure you discuss any questions you have with your health care provider. Document Released: 11/25/2004 Document Revised: 07/16/2015 Document Reviewed: 06/12/2014 Elsevier Interactive Patient Education  2017 [employer]

## 2017-02-14 NOTE — Progress Notes (Addendum)
   Subjective:    Patient ID: Tim Rogers, male    DOB: 08-12-1955, 61 y.o.   MRN: [id number]  HPI Presents today with c/o GERD.  He states today that last week, he had one of the worst bouts of acid reflux he has ever had. He says he is belching a lot. He has epigastric tenderness. He cannot drink coke, coffee. He cannot drink beer or etoh. He says his esophagus is inflamed. He also has some nausea. He says the Protonix is just not working. He says he cannot even drink a beer. He has check his stool, and it looks normal. He is not vomiting any blood.   Hemoglobin this am 17.3.   Hx of transoral fundoplication by Dr. Lenord Fellersoren in [place].      08/29/2012 EGD: Dyspepsia, bloating, early satiety. Dr. Wilma FlavinBloom, [place]. Small papilloma upper third removed with biopsy. Two nylon suture tips protruding from left anterior Z line. Small hole opposite wall at Z line. Non erosive gastritis.. Duodenitis in the duodenum.  Biopsy: Benign body of stomach biopsy with no H. Pylori   08/29/2012 Colonoscopy: Average risk and colonic polyps: Small internal and external hemorrhoids were found during the rectal exam. Two sessile polyps found in the descending colon and rectosigmoid junction. Biopsy:Hyperplastic polyp in descending and rectosigmoid colon.  Review of Systems   Past Medical History:  Diagnosis Date  . Anxiety    2003  . Ascending aortic aneurysm (HCC)   . GERD (gastroesophageal reflux disease)   . Hypertension 10/01/2016  . Low testosterone   . PVCs (premature ventricular contractions)     Past Surgical History:  Procedure Laterality Date  . CHOLECYSTECTOMY    . TIF procedure      No Known Allergies  Current Outpatient Medications on File Prior to Visit  Medication Sig Dispense Refill  . amLODipine (NORVASC) 10 MG tablet TAKE 1 TABLET BY MOUTH DAILY 30 tablet 1  . azelastine (ASTELIN) 0.1 % nasal spray as needed.     . Multiple Vitamins-Minerals (MULTIVITAMIN ADULT PO) Take by mouth.     . pantoprazole (PROTONIX) 20 MG tablet Take 20 mg by mouth daily.    Marland Kitchen. UNABLE TO FIND Testosterone implant    . ALPRAZolam (XANAX) 0.5 MG tablet Take 0.5 mg by mouth 2 (two) times daily as needed for anxiety.     No current facility-administered medications on file prior to visit.         Objective:   Physical Exam Blood pressure 102/78, pulse 72, temperature 98.3 F (36.8 C), height 5\' 11"  (1.803 m), weight 184 lb 8 oz (83.7 kg).  Alert and oriented. Skin warm and dry. Oral mucosa is moist.   . Sclera anicteric, conjunctivae is pink. Thyroid not enlarged. No cervical lymphadenopathy. Lungs clear. Heart regular rate and rhythm.  Abdomen is soft. Bowel sounds are positive. No hepatomegaly. No abdominal masses felt. No tenderness.  No edema to lower extremities.  .         Assessment & Plan:  Uncontrolled GERD. Am going to start him on Dexilant and see how he does. Samples of Dexilant given to patient x 2 weeks. He will call with a progress report. If better, will switch him to Dexilant.  GERD diet given to patient.

## 2017-02-28 ENCOUNTER — Telehealth (INDEPENDENT_AMBULATORY_CARE_PROVIDER_SITE_OTHER): Payer: Self-pay | Admitting: Internal Medicine

## 2017-02-28 NOTE — Telephone Encounter (Signed)
Patient called with a progress report, stomach is fine, everything is straightened out.  Everything is good.  [phone]

## 2017-03-02 NOTE — Telephone Encounter (Signed)
Noted  

## 2017-03-04 ENCOUNTER — Other Ambulatory Visit: Payer: Self-pay

## 2017-03-04 ENCOUNTER — Ambulatory Visit (INDEPENDENT_AMBULATORY_CARE_PROVIDER_SITE_OTHER): Payer: 59 | Admitting: Cardiology

## 2017-03-04 ENCOUNTER — Encounter: Payer: Self-pay | Admitting: Cardiology

## 2017-03-04 VITALS — BP 112/70 | HR 77 | Ht 71.0 in | Wt 188.4 lb

## 2017-03-04 DIAGNOSIS — I10 Essential (primary) hypertension: Secondary | ICD-10-CM | POA: Diagnosis not present

## 2017-03-04 DIAGNOSIS — I77810 Thoracic aortic ectasia: Secondary | ICD-10-CM | POA: Diagnosis not present

## 2017-03-04 NOTE — Patient Instructions (Signed)
Medication Instructions:  Your physician recommends that you continue on your current medications as directed. Please refer to the Current Medication list given to you today.  Labwork: NONE  Testing/Procedures: Your physician has requested that you have an echocardiogram. Echocardiography is a painless test that uses sound waves to create images of your heart. It provides your doctor with information about the size and shape of your heart and how well your heart's chambers and valves are working. This procedure takes approximately one hour. There are no restrictions for this procedure.   Follow-Up: Your physician wants you to follow-up in: 11 MONTHS WITH DR. Diona BrownerMCDOWELL You will receive a reminder letter in the mail two months in advance. If you don't receive a letter, please call our office to schedule the follow-up appointment.  Any Other Special Instructions Will Be Listed Below (If Applicable).  If you need a refill on your cardiac medications before your next appointment, please call your pharmacy.

## 2017-03-04 NOTE — Progress Notes (Signed)
Cardiology Office Note  Date: 03/04/2017   ID: Tim HeadingsJames C Bubeck, DOB 1956-01-04, MRN [id number]  PCP: Eustace MooreNelson, Yvonne Sue, MD  Primary Cardiologist: Nona DellSamuel Monserratt Knezevic, MD   Chief Complaint  Patient presents with  . Aortic root dilatation    History of Present Illness: Tim Rogers is a 62 y.o. male last seen in February 2018. He presents for a routine follow-up visit. Reports no chest pain or unusual shortness of breath, no palpitations or syncope. He states that he has been compliant with his medications including Norvasc and his blood pressure is normal today.  Patient previously followed with Dr. Katharina CaperAndre Landau of [facility] [place]. He has a history of ascending aortic root dilatation and aneurysmal change, echocardiogram reporting 4.63 cm at the Sinuses of Valsalva and 4.5 cm in the ascending aorta as of December 2015. Chest CT in October 2016 reported aortic root measuring 4.3 cm, ascending aorta 4.1 cm.  Follow-up echocardiogram in November 2018 revealed LVEF 60-65% range with moderate aortic root dilatation measuring 46 mm. I went over the results with him today.  Past Medical History:  Diagnosis Date  . Anxiety    2003  . Ascending aortic aneurysm (HCC)   . GERD (gastroesophageal reflux disease)   . Hypertension 10/01/2016  . Low testosterone   . PVCs (premature ventricular contractions)     Past Surgical History:  Procedure Laterality Date  . CHOLECYSTECTOMY    . TIF procedure      Current Outpatient Medications  Medication Sig Dispense Refill  . ALPRAZolam (XANAX) 0.5 MG tablet Take 0.5 mg by mouth 2 (two) times daily as needed for anxiety.    Marland Kitchen. amLODipine (NORVASC) 10 MG tablet TAKE 1 TABLET BY MOUTH DAILY 30 tablet 1  . azelastine (ASTELIN) 0.1 % nasal spray as needed.     . Multiple Vitamins-Minerals (MULTIVITAMIN ADULT PO) Take by mouth.    . pantoprazole (PROTONIX) 20 MG tablet Take 20 mg by mouth daily.    Marland Kitchen. UNABLE TO  FIND Testosterone implant     No current facility-administered medications for this visit.    Allergies:  Patient has no known allergies.   Social History: The patient  reports that  has never smoked. His smokeless tobacco use includes snuff. He reports that he drinks about 3.0 oz of alcohol per week. He reports that he does not use drugs.   ROS:  Please see the history of present illness. Otherwise, complete review of systems is positive for none.  All other systems are reviewed and negative.   Physical Exam: VS:  BP 112/70   Pulse 77   Ht 5\' 11"  (1.803 m)   Wt 188 lb 6.4 oz (85.5 kg)   SpO2 97%   BMI 26.28 kg/m , BMI Body mass index is 26.28 kg/m.  Wt Readings from Last 3 Encounters:  03/04/17 188 lb 6.4 oz (85.5 kg)  02/14/17 184 lb 8 oz (83.7 kg)  10/01/16 184 lb 4 oz (83.6 kg)    General: Patient appears comfortable at rest. HEENT: Conjunctiva and lids normal, oropharynx clear. Neck: Supple, no elevated JVP or carotid bruits, no thyromegaly. Lungs: Clear to auscultation, nonlabored breathing at rest. Cardiac: Regular rate and rhythm, no S3 or significant systolic murmur, no pericardial rub. Abdomen: Soft, nontender, bowel sounds present, no guarding or rebound. Extremities: No pitting edema, distal pulses 2+.  ECG: I personally reviewed the tracing from 12/01/2016 which showed sinus rhythm.  Recent Labwork:  November  2017: Hemoglobin 17.1, platelets 211, BUN 14, creatinine 0.9, AST 34, ALT 31, potassium 4.2  Other Studies Reviewed Today:  Echocardiogram 01/12/2017: Study Conclusions  - Left ventricle: The cavity size was normal. Wall thickness was   increased in a pattern of mild LVH. Systolic function was normal.   The estimated ejection fraction was in the range of 60% to 65%.   Wall motion was normal; there were no regional wall motion   abnormalities. Doppler parameters are consistent with abnormal   left ventricular relaxation (grade 1 diastolic  dysfunction). - Aortic valve: Mildly calcified annulus. Normal thickness   leaflets. There was mild regurgitation. Valve area (VTI): 4.88   cm^2. Valve area (Vmax): 4.57 cm^2. Valve area (Vmean): 4.29   cm^2. - Aorta: The aortic root is moderately dilated. Aortic root   dimension: 46 mm (ED). Ascending aortic diameter: 42 mm (S). - Technically difficult study.  Assessment and Plan:  1. Aortic root dilatation, 46 mm by echocardiogram which is relatively stable. Continue Norvasc and plan follow-up echocardiogram for comparison in November of this year.  2. History of hypertension, blood pressure is normal today on Norvasc.  Current medicines were reviewed with the patient today.   Orders Placed This Encounter  Procedures  . ECHOCARDIOGRAM COMPLETE    Disposition: Follow-up in December.  Signed, Jonelle Sidle, MD, [facility] 03/04/2017 4:03 PM    [facility] [address] [place], [place], [place] [address] Phone: [phone]; Fax: [phone]

## 2017-03-28 ENCOUNTER — Other Ambulatory Visit: Payer: Self-pay | Admitting: *Deleted

## 2017-03-28 MED ORDER — AMLODIPINE BESYLATE 10 MG PO TABS: 10.0000 mg | ORAL_TABLET | Freq: Every day | ORAL | 11 refills | Status: DC

## 2017-05-09 ENCOUNTER — Encounter: Payer: Self-pay | Admitting: Family Medicine

## 2017-08-15 ENCOUNTER — Encounter (INDEPENDENT_AMBULATORY_CARE_PROVIDER_SITE_OTHER): Payer: Self-pay | Admitting: *Deleted

## 2017-08-15 ENCOUNTER — Encounter (INDEPENDENT_AMBULATORY_CARE_PROVIDER_SITE_OTHER): Payer: Self-pay | Admitting: Internal Medicine

## 2017-08-15 ENCOUNTER — Ambulatory Visit (INDEPENDENT_AMBULATORY_CARE_PROVIDER_SITE_OTHER): Payer: 59 | Admitting: Internal Medicine

## 2017-08-15 VITALS — BP 128/88 | HR 84 | Temp 98.5°F | Ht 71.0 in | Wt 183.0 lb

## 2017-08-15 DIAGNOSIS — K219 Gastro-esophageal reflux disease without esophagitis: Secondary | ICD-10-CM

## 2017-08-15 MED ORDER — PANTOPRAZOLE SODIUM 40 MG PO TBEC
40.0000 mg | DELAYED_RELEASE_TABLET | Freq: Every day | ORAL | 3 refills | Status: DC
Start: 2017-08-15 — End: 2017-09-09

## 2017-08-15 NOTE — Patient Instructions (Addendum)
The risks of bleeding, perforation and infection were reviewed with patient.  Gastroesophageal Reflux Disease, Adult Normally, food travels down the esophagus and stays in the stomach to be digested. However, when a person has gastroesophageal reflux disease (GERD), food and stomach acid move back up into the esophagus. When this happens, the esophagus becomes sore and inflamed. Over time, GERD can create small holes (ulcers) in the lining of the esophagus. What are the causes? This condition is caused by a problem with the muscle between the esophagus and the stomach (lower esophageal sphincter, or LES). Normally, the LES muscle closes after food passes through the esophagus to the stomach. When the LES is weakened or abnormal, it does not close properly, and that allows food and stomach acid to go back up into the esophagus. The LES can be weakened by certain dietary substances, medicines, and medical conditions, including:  Tobacco use.  Pregnancy.  Having a hiatal hernia.  Heavy alcohol use.  Certain foods and beverages, such as coffee, chocolate, onions, and peppermint.  What increases the risk? This condition is more likely to develop in:  People who have an increased body weight.  People who have connective tissue disorders.  People who use NSAID medicines.  What are the signs or symptoms? Symptoms of this condition include:  Heartburn.  Difficult or painful swallowing.  The feeling of having a lump in the throat.  Abitter taste in the mouth.  Bad breath.  Having a large amount of saliva.  Having an upset or bloated stomach.  Belching.  Chest pain.  Shortness of breath or wheezing.  Ongoing (chronic) cough or a night-time cough.  Wearing away of tooth enamel.  Weight loss.  Different conditions can cause chest pain. Make sure to see your health care provider if you experience chest pain. How is this diagnosed? Your health care provider will take a  medical history and perform a physical exam. To determine if you have mild or severe GERD, your health care provider may also monitor how you respond to treatment. You may also have other tests, including:  An endoscopy toexamine your stomach and esophagus with a small camera.  A test thatmeasures the acidity level in your esophagus.  A test thatmeasures how much pressure is on your esophagus.  A barium swallow or modified barium swallow to show the shape, size, and functioning of your esophagus.  How is this treated? The goal of treatment is to help relieve your symptoms and to prevent complications. Treatment for this condition may vary depending on how severe your symptoms are. Your health care provider may recommend:  Changes to your diet.  Medicine.  Surgery.  Follow these instructions at home: Diet  Follow a diet as recommended by your health care provider. This may involve avoiding foods and drinks such as: ? Coffee and tea (with or without caffeine). ? Drinks that containalcohol. ? Energy drinks and sports drinks. ? Carbonated drinks or sodas. ? Chocolate and cocoa. ? Peppermint and mint flavorings. ? Garlic and onions. ? Horseradish. ? Spicy and acidic foods, including peppers, chili powder, curry powder, vinegar, hot sauces, and barbecue sauce. ? Citrus fruit juices and citrus fruits, such as oranges, lemons, and limes. ? Tomato-based foods, such as red sauce, chili, salsa, and pizza with red sauce. ? Fried and fatty foods, such as donuts, french fries, potato chips, and high-fat dressings. ? High-fat meats, such as hot dogs and fatty cuts of red and white meats, such as rib eye steak,   sausage, ham, and bacon. ? High-fat dairy items, such as whole milk, butter, and cream cheese.  Eat small, frequent meals instead of large meals.  Avoid drinking large amounts of liquid with your meals.  Avoid eating meals during the 2-3 hours before bedtime.  Avoid lying down  right after you eat.  Do not exercise right after you eat. General instructions  Pay attention to any changes in your symptoms.  Take over-the-counter and prescription medicines only as told by your health care provider. Do not take aspirin, ibuprofen, or other NSAIDs unless your health care provider told you to do so.  Do not use any tobacco products, including cigarettes, chewing tobacco, and e-cigarettes. If you need help quitting, ask your health care provider.  Wear loose-fitting clothing. Do not wear anything tight around your waist that causes pressure on your abdomen.  Raise (elevate) the head of your bed 6 inches (15cm).  Try to reduce your stress, such as with yoga or meditation. If you need help reducing stress, ask your health care provider.  If you are overweight, reduce your weight to an amount that is healthy for you. Ask your health care provider for guidance about a safe weight loss goal.  Keep all follow-up visits as told by your health care provider. This is important. Contact a health care provider if:  You have new symptoms.  You have unexplained weight loss.  You have difficulty swallowing, or it hurts to swallow.  You have wheezing or a persistent cough.  Your symptoms do not improve with treatment.  You have a hoarse voice. Get help right away if:  You have pain in your arms, neck, jaw, teeth, or back.  You feel sweaty, dizzy, or light-headed.  You have chest pain or shortness of breath.  You vomit and your vomit looks like blood or coffee grounds.  You faint.  Your stool is bloody or black.  You cannot swallow, drink, or eat. This information is not intended to replace advice given to you by your health care provider. Make sure you discuss any questions you have with your health care provider. Document Released: 11/25/2004 Document Revised: 07/16/2015 Document Reviewed: 06/12/2014 Elsevier Interactive Patient Education  2018 [employer]  

## 2017-08-15 NOTE — Progress Notes (Addendum)
   Subjective:    Patient ID: Tim Rogers, male    DOB: 04-23-1955, 62 y.o.   MRN: [id number]  HPI Herea today for f/u. He thinks his Protonix is not working. He says his stomach stays nervous. Sometimes in the afternoon he gets indigestion. H use to drink a coke but gives him problems. Mixed drinks bother him. He rarely drinks in the winter.  One day he can eat peppers and it doesn't bother him and sometimes they do. Keeps HOB elevated. Has maintained his weight.  Stools are normal.  States his last EGD was with Propofol.  Usually eats 2 meals a day.  Hx of transoral fundoplication by Dr. Lenord Fellersoren in [place].   08/29/2012 EGD: Dyspepsia, bloating, early satiety. Dr. Wilma FlavinBloom, [place]. Small papilloma upper third removed with biopsy. Two nylon suture tips protruding from left anterior Z line. Small hole opposite wall at Z line. Non erosive gastritis.. Duodenitis in the duodenum.  Biopsy: Benign body of stomach biopsy with no H. Pylori   08/29/2012 Colonoscopy:Average risk and colonic polyps: Small internal and external hemorrhoids were found during the rectal exam. Two sessile polyps found in the descending colon and rectosigmoid junction. Biopsy:Hyperplastic polyp in descending and rectosigmoid colon. Review of Systems      Review of Systems Past Medical History:  Diagnosis Date  . Anxiety    2003  . Ascending aortic aneurysm (HCC)   . GERD (gastroesophageal reflux disease)   . Hypertension 10/01/2016  . Low testosterone   . PVCs (premature ventricular contractions)     Past Surgical History:  Procedure Laterality Date  . CHOLECYSTECTOMY    . TIF procedure      No Known Allergies  Current Outpatient Medications on File Prior to Visit  Medication Sig Dispense Refill  . amLODipine (NORVASC) 10 MG tablet Take 1 tablet (10 mg total) by mouth daily. 30 tablet 11  . azelastine (ASTELIN) 0.1 % nasal spray as needed.     . Multiple Vitamins-Minerals (MULTIVITAMIN ADULT  PO) Take by mouth.    . pantoprazole (PROTONIX) 20 MG tablet Take 20 mg by mouth daily.    Marland Kitchen. UNABLE TO FIND Testosterone implant     No current facility-administered medications on file prior to visit.         Objective:   Physical Exam Blood pressure 128/88, pulse 84, temperature 98.5 F (36.9 C), height 5\' 11"  (1.803 m), weight 183 lb (83 kg). Alert and oriented. Skin warm and dry. Oral mucosa is moist.   . Sclera anicteric, conjunctivae is pink. Thyroid not enlarged. No cervical lymphadenopathy. Lungs clear. Heart regular rate and rhythm.  Abdomen is soft. Bowel sounds are positive. No hepatomegaly. No abdominal masses felt. No tenderness.  No edema to lower extremities. .        Assessment & Plan:  GERD.  EGD. GERD. The risks of bleeding, perforation and infection were reviewed with patient. GERD diet given to patient at OV.

## 2017-09-07 ENCOUNTER — Telehealth (INDEPENDENT_AMBULATORY_CARE_PROVIDER_SITE_OTHER): Payer: Self-pay | Admitting: Internal Medicine

## 2017-09-07 ENCOUNTER — Other Ambulatory Visit (INDEPENDENT_AMBULATORY_CARE_PROVIDER_SITE_OTHER): Payer: Self-pay | Admitting: Internal Medicine

## 2017-09-07 ENCOUNTER — Encounter (INDEPENDENT_AMBULATORY_CARE_PROVIDER_SITE_OTHER): Payer: Self-pay | Admitting: *Deleted

## 2017-09-07 DIAGNOSIS — K219 Gastro-esophageal reflux disease without esophagitis: Secondary | ICD-10-CM

## 2017-09-07 NOTE — Telephone Encounter (Signed)
Ann, please change the EGD without propofol. Thank, Avilyn Virtue. Patient is aware that he will be rescheduled.

## 2017-09-07 NOTE — Telephone Encounter (Signed)
EGD changed to general moderate sedation and resch'd to 08/22/17 at 255 (150), left detailed message for patient

## 2017-09-07 NOTE — Telephone Encounter (Signed)
I have spoken with patient. EGD without propofol

## 2017-09-07 NOTE — Telephone Encounter (Signed)
Please call this patient - he has questions about his procedure - he left message on Ann's voice mail - he called again this morning but seems disgruntled about waiting until she comes in to talk to someone

## 2017-09-09 ENCOUNTER — Inpatient Hospital Stay (HOSPITAL_COMMUNITY): Admission: RE | Admit: 2017-09-09 | Payer: 59 | Source: Ambulatory Visit

## 2017-09-09 ENCOUNTER — Other Ambulatory Visit (INDEPENDENT_AMBULATORY_CARE_PROVIDER_SITE_OTHER): Payer: Self-pay | Admitting: Internal Medicine

## 2017-09-09 DIAGNOSIS — K219 Gastro-esophageal reflux disease without esophagitis: Secondary | ICD-10-CM

## 2017-09-09 MED ORDER — PANTOPRAZOLE SODIUM 40 MG PO TBEC: 40.0000 mg | DELAYED_RELEASE_TABLET | Freq: Every day | ORAL | 3 refills | Status: DC

## 2017-09-13 ENCOUNTER — Other Ambulatory Visit (INDEPENDENT_AMBULATORY_CARE_PROVIDER_SITE_OTHER): Payer: Self-pay | Admitting: Internal Medicine

## 2017-09-13 ENCOUNTER — Telehealth (INDEPENDENT_AMBULATORY_CARE_PROVIDER_SITE_OTHER): Payer: Self-pay | Admitting: Internal Medicine

## 2017-09-13 MED ORDER — PANTOPRAZOLE SODIUM 20 MG PO TBEC: DELAYED_RELEASE_TABLET | ORAL | 3 refills | Status: DC

## 2017-09-13 NOTE — Progress Notes (Signed)
error 

## 2017-09-13 NOTE — Telephone Encounter (Signed)
Rx for Protonix sent to his pharmacy 

## 2017-09-15 ENCOUNTER — Telehealth (INDEPENDENT_AMBULATORY_CARE_PROVIDER_SITE_OTHER): Payer: Self-pay | Admitting: *Deleted

## 2017-09-15 NOTE — Telephone Encounter (Signed)
Patient's Insurance would not cover the bid dosing of the 40 mg or 20 mg Pantoprazole. Per Dorene Arerri Setzer, NP-C - May call in the Pantoprazole 40 mg - Take 1 by mouth 30 minutes before breakfast. #90  3 Refills . Patient may get the Pepcid or Omeprazole OTC to take for breakthrough symptoms at night.  This was called to the patient's pharmacy.

## 2017-09-16 ENCOUNTER — Encounter (HOSPITAL_COMMUNITY): Admission: RE | Payer: Self-pay | Source: Ambulatory Visit

## 2017-09-16 ENCOUNTER — Ambulatory Visit (HOSPITAL_COMMUNITY): Admission: RE | Admit: 2017-09-16 | Payer: 59 | Source: Ambulatory Visit | Admitting: Internal Medicine

## 2017-09-16 SURGERY — ESOPHAGOGASTRODUODENOSCOPY (EGD) WITH PROPOFOL
Anesthesia: Monitor Anesthesia Care

## 2017-09-21 ENCOUNTER — Encounter (HOSPITAL_COMMUNITY): Payer: Self-pay

## 2017-09-21 ENCOUNTER — Ambulatory Visit (HOSPITAL_COMMUNITY)
Admission: RE | Admit: 2017-09-21 | Discharge: 2017-09-21 | Disposition: A | Discharge status: Home / Self Care | Payer: 59 | Source: Ambulatory Visit | Attending: Internal Medicine | Admitting: Internal Medicine

## 2017-09-21 ENCOUNTER — Encounter (HOSPITAL_COMMUNITY)
Admission: RE | Disposition: A | Discharge status: Home / Self Care | Payer: Self-pay | Source: Ambulatory Visit | Attending: Internal Medicine

## 2017-09-21 ENCOUNTER — Other Ambulatory Visit: Payer: Self-pay

## 2017-09-21 DIAGNOSIS — K449 Diaphragmatic hernia without obstruction or gangrene: Secondary | ICD-10-CM

## 2017-09-21 DIAGNOSIS — T8189XS Other complications of procedures, not elsewhere classified, sequela: Secondary | ICD-10-CM | POA: Diagnosis not present

## 2017-09-21 DIAGNOSIS — K219 Gastro-esophageal reflux disease without esophagitis: Secondary | ICD-10-CM | POA: Insufficient documentation

## 2017-09-21 DIAGNOSIS — Z79899 Other long term (current) drug therapy: Secondary | ICD-10-CM | POA: Insufficient documentation

## 2017-09-21 DIAGNOSIS — I10 Essential (primary) hypertension: Secondary | ICD-10-CM | POA: Insufficient documentation

## 2017-09-21 DIAGNOSIS — F17290 Nicotine dependence, other tobacco product, uncomplicated: Secondary | ICD-10-CM | POA: Diagnosis not present

## 2017-09-21 DIAGNOSIS — K228 Other specified diseases of esophagus: Secondary | ICD-10-CM

## 2017-09-21 DIAGNOSIS — I712 Thoracic aortic aneurysm, without rupture: Secondary | ICD-10-CM | POA: Insufficient documentation

## 2017-09-21 HISTORY — PX: ESOPHAGOGASTRODUODENOSCOPY: SHX5428

## 2017-09-21 SURGERY — EGD (ESOPHAGOGASTRODUODENOSCOPY)
Anesthesia: Moderate Sedation

## 2017-09-21 MED ORDER — LIDOCAINE VISCOUS HCL 2 % MT SOLN
OROMUCOSAL | Status: DC | PRN
  Administered 2017-09-21: 5 mL via OROMUCOSAL

## 2017-09-21 MED ORDER — HYDROCORTISONE ACETATE 25 MG RE SUPP: 25.0000 mg | Freq: Every day | RECTAL | 1 refills | Status: DC

## 2017-09-21 MED ORDER — LIDOCAINE VISCOUS HCL 2 % MT SOLN
OROMUCOSAL | Status: AC
  Filled 2017-09-21: qty 15

## 2017-09-21 MED ORDER — SODIUM CHLORIDE 0.9 % IV SOLN
INTRAVENOUS | Status: DC
  Administered 2017-09-21: 14:00:00 via INTRAVENOUS

## 2017-09-21 MED ORDER — FAMOTIDINE 20 MG PO TABS: 20.0000 mg | ORAL_TABLET | Freq: Every evening | ORAL | Status: DC | PRN

## 2017-09-21 MED ORDER — MIDAZOLAM HCL 5 MG/5ML IJ SOLN
INTRAMUSCULAR | Status: AC
  Filled 2017-09-21: qty 10

## 2017-09-21 MED ORDER — MIDAZOLAM HCL 5 MG/5ML IJ SOLN
INTRAMUSCULAR | Status: DC | PRN
  Administered 2017-09-21: 3 mg via INTRAVENOUS
  Administered 2017-09-21 (×2): 2 mg via INTRAVENOUS

## 2017-09-21 MED ORDER — PANTOPRAZOLE SODIUM 20 MG PO TBEC: 20.0000 mg | DELAYED_RELEASE_TABLET | Freq: Every day | ORAL | 11 refills | Status: DC

## 2017-09-21 MED ORDER — SIMETHICONE 40 MG/0.6ML PO SUSP
ORAL | Status: DC | PRN
  Administered 2017-09-21: 15:00:00

## 2017-09-21 MED ORDER — MEPERIDINE HCL 50 MG/ML IJ SOLN
INTRAMUSCULAR | Status: AC
  Filled 2017-09-21: qty 1

## 2017-09-21 MED ORDER — MEPERIDINE HCL 50 MG/ML IJ SOLN
INTRAMUSCULAR | Status: DC | PRN
  Administered 2017-09-21 (×2): 25 mg via INTRAVENOUS

## 2017-09-21 NOTE — Discharge Instructions (Signed)
Pantoprazole 20 mg by mouth 30 minutes before breakfast daily. Can take Pepcid OTC or famotidine OTC 20 mg in the evening or at bedtime on as-needed basis. Other medications as before. Resume usual diet. Driving for 24 hours. Office visit in 6 months   Upper Endoscopy, Care After Refer to this sheet in the next few weeks. These instructions provide you with information about caring for yourself after your procedure. Your health care provider may also give you more specific instructions. Your treatment has been planned according to current medical practices, but problems sometimes occur. Call your health care provider if you have any problems or questions after your procedure. What can I expect after the procedure? After the procedure, it is common to have:  A sore throat.  Bloating.  Nausea.  Follow these instructions at home:  Follow instructions from your health care provider about what to eat or drink after your procedure.  Return to your normal activities as told by your health care provider. Ask your health care provider what activities are safe for you.  Take over-the-counter and prescription medicines only as told by your health care provider.  Do not drive for 24 hours if you received a sedative.  Keep all follow-up visits as told by your health care provider. This is important. Contact a health care provider if:  You have a sore throat that lasts longer than one day.  You have trouble swallowing. Get help right away if:  You have a fever.  You vomit blood or your vomit looks like coffee grounds.  You have bloody, black, or tarry stools.  You have a severe sore throat or you cannot swallow.  You have difficulty breathing.  You have severe pain in your chest or belly. This information is not intended to replace advice given to you by your health care provider. Make sure you discuss any questions you have with your health care provider. Document Released:  08/17/2011 Document Revised: 07/24/2015 Document Reviewed: 11/28/2014 Elsevier Interactive Patient Education  [employer]2018 Elsevier Inc.

## 2017-09-21 NOTE — Op Note (Addendum)
[facility] Patient Name: Tim Rogers Procedure Date: 09/21/2017 2:13 PM MRN: [id number] Date of Birth: 06/12/1955 Attending MD: Lionel DecemberNajeeb Rehman , MD CSN: [id number] Age: 62 Admit Type: Outpatient Procedure:                Upper GI endoscopy Indications:              Follow-up of gastro-esophageal reflux disease Providers:                Lionel DecemberNajeeb Rehman, MD, Loma MessingLurae B. Patsy LagerAlbert RN, RN, Burke Keelsrisann                            Tilley, Technician Referring MD:             Wilson SingerNimish C. Gosrani, MD Medicines:                Lidocaine spray, Meperidine 50 mg IV, Midazolam 7                            mg IV Complications:            No immediate complications. Estimated Blood Loss:     Estimated blood loss: none. Procedure:                Pre-Anesthesia Assessment:                           - Prior to the procedure, a History and Physical                            was performed, and patient medications and                            allergies were reviewed. The patient's tolerance of                            previous anesthesia was also reviewed. The risks                            and benefits of the procedure and the sedation                            options and risks were discussed with the patient.                            All questions were answered, and informed consent                            was obtained. Prior Anticoagulants: The patient has                            taken no previous anticoagulant or antiplatelet                            agents. ASA Grade Assessment: II - A patient with  mild systemic disease. After reviewing the risks                            and benefits, the patient was deemed in                            satisfactory condition to undergo the procedure.                           After obtaining informed consent, the endoscope was                            passed under direct vision. Throughout the   procedure, the patient's blood pressure, pulse, and                            oxygen saturations were monitored continuously. The                            [id number]-H190 ([id number]) scope was introduced through the                            mouth, and advanced to the second part of duodenum.                            The upper GI endoscopy was accomplished without                            difficulty. The patient tolerated the procedure                            well. Scope In: 2:38:39 PM Scope Out: 2:46:47 PM Total Procedure Duration: 0 hours 8 minutes 8 seconds  Findings:      The examined esophagus was normal.      The Z-line was irregular and was found 39 cm from the incisors.      A 2 cm hiatal hernia was present.      A suture was found in the gastric fundus.      The exam of the stomach was otherwise normal.      The duodenal bulb and second portion of the duodenum were normal. Impression:               - Normal esophagus.                           - Z-line irregular, 39 cm from the incisors.                           - 2 cm hiatal hernia.                           - Suture at fundus rfom previous surgery.                           - Normal duodenal bulb and second portion of the  duodenum.                           - No specimens collected. Moderate Sedation:      Moderate (conscious) sedation was administered by the endoscopy nurse       and supervised by the endoscopist. The following parameters were       monitored: oxygen saturation, heart rate, blood pressure, CO2       capnography and response to care. Total physician intraservice time was       13 minutes. Recommendation:           - Patient has a contact number available for                            emergencies. The signs and symptoms of potential                            delayed complications were discussed with the                            patient. Return to normal activities tomorrow.                             Written discharge instructions were provided to the                            patient.                           - Resume previous diet today.                           - Continue present medications.                           - Pepcid OTC 20 mg po qd prn.                           - Return to GI clinic in 6 months. Procedure Code(s):        --- Professional ---                           [phone], Esophagogastroduodenoscopy, flexible,                            transoral; diagnostic, including collection of                            specimen(s) by brushing or washing, when performed                            (separate procedure)                           G0500, Moderate sedation services provided by the  same physician or other qualified health care                            professional performing a gastrointestinal                            endoscopic service that sedation supports,                            requiring the presence of an independent trained                            observer to assist in the monitoring of the                            patient's level of consciousness and physiological                            status; initial 15 minutes of intra-service time;                            patient age 83 years or older (additional time may                            be reported with 16109, as appropriate) Diagnosis Code(s):        --- Professional ---                           K22.8, Other specified diseases of esophagus                           K44.9, Diaphragmatic hernia without obstruction or                            gangrene                           T81.89XS, Other complications of procedures, not                            elsewhere classified, sequela                           K21.9, Gastro-esophageal reflux disease without                            esophagitis CPT copyright 2017 American Medical  Association. All rights reserved. The codes documented in this report are preliminary and upon coder review may  be revised to meet current compliance requirements. Lionel December, MD Lionel December, MD 09/21/2017 3:04:39 PM This report has been signed electronically. Number of Addenda: 0

## 2017-09-21 NOTE — H&P (Signed)
Tim HeadingsJames C Rogers is an 62 y.o. male.   Chief Complaint: Patient is here for EGD. HPI: Patient 62 year old Caucasian male with chronic GERD who is been on therapy since 1995.  He had transoral antireflux procedure which only helped him for 1 year.  He is watching his diet.  He feels pantoprazole 40 mg a day made him constipated and he may have developed fissure.  He is taking pantoprazole 20 mg a day and feels he is doing much better.  He denies dysphagia nausea vomiting abdominal pain or melena.  Does not smoke cigarettes and drinks alcohol occasionally.  Past Medical History:  Diagnosis Date  . Anxiety    2003  . Ascending aortic aneurysm (HCC)   . GERD (gastroesophageal reflux disease)   . Hypertension 10/01/2016  . Low testosterone   . PVCs (premature ventricular contractions)     Past Surgical History:  Procedure Laterality Date  . CHOLECYSTECTOMY    . TIF procedure      Family History  Problem Relation Age of Onset  . Alcohol abuse Mother   . Cancer Mother        lung  . COPD Mother   . Heart disease Mother   . Early death Mother        age 63 multiple factors  . Cancer Sister        breast   Social History:  reports that he has never smoked. His smokeless tobacco use includes snuff. He reports that he drinks about 3.0 oz of alcohol per week. He reports that he does not use drugs.  Allergies: No Known Allergies  Medications Prior to Admission  Medication Sig Dispense Refill  . amLODipine (NORVASC) 10 MG tablet Take 1 tablet (10 mg total) by mouth daily. 30 tablet 11  . Multiple Vitamins-Minerals (MULTIVITAMIN ADULT PO) Take 1 tablet by mouth daily.     . pantoprazole (PROTONIX) 20 MG tablet Take 2 tabs in am before breakfast. 60 tablet 3  . azelastine (ASTELIN) 0.1 % nasal spray Place 2 sprays into both nostrils daily as needed for allergies.     Marland Kitchen. UNABLE TO FIND Testosterone implant      No results found for this or any previous visit (from the past 48  hour(s)). No results found.  ROS  Blood pressure (!) 139/95, pulse (!) 59, temperature 98.5 F (36.9 C), temperature source Oral, resp. rate 20, height 5\' 11"  (1.803 m), weight 176 lb (79.8 kg), SpO2 100 %. Physical Exam  Constitutional: He appears well-developed and well-nourished.  HENT:  Mouth/Throat: Oropharynx is clear and moist.  Eyes: Conjunctivae are normal. No scleral icterus.  Neck: No thyromegaly present.  Cardiovascular: Normal rate, regular rhythm and normal heart sounds.  No murmur heard. Respiratory: Effort normal and breath sounds normal.  GI: Soft. He exhibits no distension and no mass. There is no tenderness.  Musculoskeletal: He exhibits no edema.  Lymphadenopathy:    He has no cervical adenopathy.  Neurological: He is alert.  Skin: Skin is warm and dry.     Assessment/Plan Chronic GERD. Diagnostic EGD.  Lionel DecemberNajeeb Dearius Hoffmann, MD 09/21/2017, 2:28 PM

## 2017-09-23 ENCOUNTER — Other Ambulatory Visit (INDEPENDENT_AMBULATORY_CARE_PROVIDER_SITE_OTHER): Payer: Self-pay | Admitting: Internal Medicine

## 2017-09-23 MED ORDER — HYDROCORTISONE 2.5 % RE CREA: 1.0000 "application " | TOPICAL_CREAM | Freq: Two times a day (BID) | RECTAL | 1 refills | Status: DC

## 2017-09-26 ENCOUNTER — Encounter (HOSPITAL_COMMUNITY): Payer: Self-pay | Admitting: Internal Medicine

## 2017-10-17 ENCOUNTER — Other Ambulatory Visit (INDEPENDENT_AMBULATORY_CARE_PROVIDER_SITE_OTHER): Payer: Self-pay | Admitting: *Deleted

## 2017-10-17 DIAGNOSIS — K219 Gastro-esophageal reflux disease without esophagitis: Secondary | ICD-10-CM

## 2017-10-17 MED ORDER — PANTOPRAZOLE SODIUM 40 MG PO TBEC: 40.0000 mg | DELAYED_RELEASE_TABLET | Freq: Every day | ORAL | 3 refills | Status: DC

## 2017-10-19 ENCOUNTER — Other Ambulatory Visit (INDEPENDENT_AMBULATORY_CARE_PROVIDER_SITE_OTHER): Payer: Self-pay | Admitting: Internal Medicine

## 2017-10-19 DIAGNOSIS — K219 Gastro-esophageal reflux disease without esophagitis: Secondary | ICD-10-CM

## 2017-10-19 MED ORDER — PANTOPRAZOLE SODIUM 20 MG PO TBEC: 20.0000 mg | DELAYED_RELEASE_TABLET | Freq: Every day | ORAL | 11 refills | Status: DC

## 2018-01-04 ENCOUNTER — Other Ambulatory Visit: Payer: Self-pay

## 2018-01-04 ENCOUNTER — Telehealth: Payer: Self-pay | Admitting: *Deleted

## 2018-01-04 ENCOUNTER — Ambulatory Visit (INDEPENDENT_AMBULATORY_CARE_PROVIDER_SITE_OTHER): Payer: 59

## 2018-01-04 DIAGNOSIS — I77810 Thoracic aortic ectasia: Secondary | ICD-10-CM

## 2018-01-04 NOTE — Telephone Encounter (Signed)
Patient informed. Copy sent to PCP °

## 2018-01-04 NOTE — Telephone Encounter (Signed)
-----   Message from Jonelle Sidle, MD sent at 01/04/2018  1:28 PM EST ----- Results reviewed.  Aortic root and ascending aortic dilatation are stable in comparison to the previous study.  Can discuss further office follow-up. A copy of this test should be forwarded to Wilson Singer, MD.

## 2018-03-01 ENCOUNTER — Other Ambulatory Visit: Payer: Self-pay | Admitting: Cardiology

## 2018-03-28 ENCOUNTER — Ambulatory Visit (INDEPENDENT_AMBULATORY_CARE_PROVIDER_SITE_OTHER): Payer: 59 | Admitting: Internal Medicine

## 2018-03-29 ENCOUNTER — Ambulatory Visit (INDEPENDENT_AMBULATORY_CARE_PROVIDER_SITE_OTHER): Payer: 59 | Admitting: Cardiology

## 2018-03-29 ENCOUNTER — Encounter: Payer: Self-pay | Admitting: Cardiology

## 2018-03-29 VITALS — BP 128/80 | HR 87 | Ht 71.0 in | Wt 174.0 lb

## 2018-03-29 DIAGNOSIS — I10 Essential (primary) hypertension: Secondary | ICD-10-CM | POA: Diagnosis not present

## 2018-03-29 DIAGNOSIS — I77810 Thoracic aortic ectasia: Secondary | ICD-10-CM

## 2018-03-29 NOTE — Progress Notes (Signed)
Cardiology Office Note  Date: 03/29/2018   ID: Tim Rogers, DOB 07-15-1955, MRN [id number]  PCP: Tim Rogers, Tim C, MD  Primary Cardiologist: Nona DellSamuel Terrianne Cavness, MD   Chief Complaint  Patient presents with  . Cardiac follow-up    History of Present Illness: Tim HeadingsJames C Rogers is a 63 y.o. male last seen in January 2019.  He is here for a routine follow-up visit.  He reports no exertional symptoms at this time. 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  He walks 2 miles on the treadmill and does other weight workouts.  Most recent follow-up echocardiogram in November 2019 revealed normal LVEF at 60 to 65%, mild aortic regurgitation, moderately dilated aortic root at 46 mm, and mildly dilated ascending aorta at 42 mm.  These are stable findings, we discussed the results today.  I reviewed his ECG which shows sinus rhythm with leftward axis and low voltage, nonspecific ST changes.  We went over his medications which are listed below.  He states that very infrequently he feels somewhat dizzy after taking his Norvasc.  Past Medical History:  Diagnosis Date  . Anxiety    2003  . Ascending aortic aneurysm (HCC)   . GERD (gastroesophageal reflux disease)   . Hypertension 10/01/2016  . Low testosterone   . PVCs (premature ventricular contractions)     Past Surgical History:  Procedure Laterality Date  . CHOLECYSTECTOMY    . ESOPHAGOGASTRODUODENOSCOPY N/A 09/21/2017   Procedure: ESOPHAGOGASTRODUODENOSCOPY (EGD);  Surgeon: Malissa Hippoehman, Najeeb U, MD;  Location: [facility];  Service: Endoscopy;  Laterality: N/A;  2:55  . TIF procedure      Current Outpatient Medications  Medication Sig Dispense Refill  . amLODipine (NORVASC) 10 MG tablet TAKE 1 TABLET(10 MG) BY MOUTH DAILY 30 tablet 11  . azelastine (ASTELIN) 0.1 % nasal spray Place 2 sprays into both nostrils daily as needed for allergies.     . hydrocortisone (ANUSOL-HC)  2.5 % rectal cream Place 1 application rectally 2 (two) times daily. 30 g 1  . hydrocortisone (ANUSOL-HC) 25 MG suppository Place 1 suppository (25 mg total) rectally at bedtime. 14 suppository 1  . Multiple Vitamins-Minerals (MULTIVITAMIN ADULT PO) Take 1 tablet by mouth daily.     . pantoprazole (PROTONIX) 20 MG tablet Take 1 tablet (20 mg total) by mouth daily. 30 tablet 11  . UNABLE TO FIND Testosterone implant     No current facility-administered medications for this visit.    Allergies:  Patient has no known allergies.   Social History: The patient  reports that he has never smoked. His smokeless tobacco use includes snuff. He reports current alcohol use of about 5.0 standard drinks of alcohol per week. He reports that he does not use drugs.   ROS:  Please see the history of present illness. Otherwise, complete review of systems is positive for none.  All other systems are reviewed and negative.   Physical Exam: VS:  BP 128/80   Pulse 87   Ht 5\' 11"  (1.803 m)   Wt 174 lb (78.9 kg)   SpO2 97%   BMI 24.27 kg/m , BMI Body mass index is 24.27 kg/m.  Wt Readings from Last 3 Encounters:  03/29/18 174 lb (78.9 kg)  09/21/17 176 lb (79.8 kg)  08/15/17 183 lb (83 kg)    General: Patient appears comfortable at rest. HEENT: Conjunctiva and lids normal, oropharynx clear. Neck: Supple, no elevated JVP  or carotid bruits, no thyromegaly. Lungs: Clear to auscultation, nonlabored breathing at rest. Cardiac: Regular rate and rhythm, no S3 or significant systolic murmur. Abdomen: Soft, nontender, bowel sounds present. Extremities: No pitting edema, distal pulses 2+.  ECG: I personally reviewed the tracing from 12/01/2008 which showed normal sinus rhythm.  Recent Labwork:  November 2017: Hemoglobin 17.1, platelets 211, BUN 14, creatinine 0.9, AST 34, ALT 31, potassium 4.2  Other Studies Reviewed Today:  Echocardiogram 01/04/2018: Study Conclusions  - Left ventricle: The cavity size  was normal. There was focal basal   hypertrophy. Systolic function was normal. The estimated ejection   fraction was in the range of 60% to 65%. Wall motion was normal;   there were no regional wall motion abnormalities. The study is   not technically sufficient to allow evaluation of LV diastolic   function. - Aortic valve: There was mild regurgitation. Valve area (VTI):   4.71 cm^2. Valve area (Vmax): 4.76 cm^2. Valve area (Vmean): 4.71   cm^2. - Aorta: Aortic root dimension: 46 mm (ED). Ascending aortic   diameter: 42 mm (S). - Aortic root: The aortic root was moderately dilated. The   visualized portion of the proximal ascending aorta is mildly   dilated.  Assessment and Plan:  1.  Aortic root and ascending aortic dilatation, stable by recent echocardiogram.  He is asymptomatic.  We will continue with follow-up imaging in 1 year and clinical assessment at that time.  2.  Essential hypertension, he continues on Norvasc, blood pressure is well controlled today.  Current medicines were reviewed with the patient today.   Orders Placed This Encounter  Procedures  . EKG 12-Lead  . ECHOCARDIOGRAM COMPLETE    Disposition: Follow-up in 1 year.   Signed, Tim Sidle, MD, [facility] 03/29/2018 1:47 PM    [facility] [address] [place], [place], [place] [address] Phone: [phone]; Fax: [phone]

## 2018-03-29 NOTE — Patient Instructions (Signed)
Medication Instructions:  Continue all current medications.  Labwork: none  Testing/Procedures:  Your physician has requested that you have an echocardiogram. Echocardiography is a painless test that uses sound waves to create images of your heart. It provides your doctor with information about the size and shape of your heart and how well your heart's chambers and valves are working. This procedure takes approximately one hour. There are no restrictions for this procedure.  DUE JUST PRIOR TO NEXT VISIT IN 1 YEAR  Follow-Up: Your physician wants you to follow up in:  1 year.  You will receive a reminder letter in the mail one-two months in advance.  If you don't receive a letter, please call our office to schedule the follow up appointment   Any Other Special Instructions Will Be Listed Below (If Applicable).  If you need a refill on your cardiac medications before your next appointment, please call your pharmacy.

## 2018-05-24 ENCOUNTER — Other Ambulatory Visit (INDEPENDENT_AMBULATORY_CARE_PROVIDER_SITE_OTHER): Payer: Self-pay | Admitting: Internal Medicine

## 2018-05-24 MED ORDER — HYDROCORTISONE ACETATE 25 MG RE SUPP: 25.0000 mg | Freq: Every day | RECTAL | 1 refills | Status: DC

## 2018-06-13 ENCOUNTER — Ambulatory Visit (INDEPENDENT_AMBULATORY_CARE_PROVIDER_SITE_OTHER): Payer: 59 | Admitting: Internal Medicine

## 2018-07-12 ENCOUNTER — Ambulatory Visit (INDEPENDENT_AMBULATORY_CARE_PROVIDER_SITE_OTHER): Payer: 59 | Admitting: Internal Medicine

## 2018-09-06 ENCOUNTER — Other Ambulatory Visit (INDEPENDENT_AMBULATORY_CARE_PROVIDER_SITE_OTHER): Payer: Self-pay | Admitting: Internal Medicine

## 2018-09-06 DIAGNOSIS — K219 Gastro-esophageal reflux disease without esophagitis: Secondary | ICD-10-CM

## 2018-11-02 ENCOUNTER — Other Ambulatory Visit (INDEPENDENT_AMBULATORY_CARE_PROVIDER_SITE_OTHER): Payer: Self-pay | Admitting: *Deleted

## 2018-11-02 ENCOUNTER — Telehealth (INDEPENDENT_AMBULATORY_CARE_PROVIDER_SITE_OTHER): Payer: Self-pay | Admitting: *Deleted

## 2018-11-02 DIAGNOSIS — K219 Gastro-esophageal reflux disease without esophagitis: Secondary | ICD-10-CM

## 2018-11-02 MED ORDER — PANTOPRAZOLE SODIUM 40 MG PO TBEC
40.0000 mg | DELAYED_RELEASE_TABLET | Freq: Every day | ORAL | 0 refills | Status: DC
Start: 2018-11-02 — End: 2019-04-10

## 2018-11-02 NOTE — Telephone Encounter (Signed)
We have approved a refill for Pantoprazole 40 mg. Per Jaclyn Shaggy patient will need a office visit prior to further refills. He was last seen in the office 01/05/16 and a procedure 09/21/2017.

## 2018-11-09 ENCOUNTER — Telehealth (INDEPENDENT_AMBULATORY_CARE_PROVIDER_SITE_OTHER): Payer: Self-pay | Admitting: *Deleted

## 2018-11-09 NOTE — Telephone Encounter (Signed)
Patient called the office and first stated that we had not responded to the [place] request for  His refill on Pantoprazole. Ir was documented that we had called this in and that it was ready for the patient.  Patient then stated , that he needed the 20 mg Pantoprazole as he has to cut the 40 mg in half. A Rx was called to the [facility] on [address] for Pantoprazole 20 mg - take 1 by mouth daily. #30  5 refills.  We advised the pharmacy that if his pharmacy approved it he may get a 90 supply with 3 additional refills.  Patient was called and made aware.

## 2018-11-15 ENCOUNTER — Other Ambulatory Visit: Payer: Self-pay

## 2018-11-15 ENCOUNTER — Encounter (INDEPENDENT_AMBULATORY_CARE_PROVIDER_SITE_OTHER): Payer: Self-pay | Admitting: Internal Medicine

## 2018-11-15 ENCOUNTER — Ambulatory Visit (INDEPENDENT_AMBULATORY_CARE_PROVIDER_SITE_OTHER): Payer: 59 | Admitting: Internal Medicine

## 2018-11-15 VITALS — BP 110/70 | HR 72 | Ht 71.0 in | Wt 180.8 lb

## 2018-11-15 DIAGNOSIS — Z1159 Encounter for screening for other viral diseases: Secondary | ICD-10-CM

## 2018-11-15 DIAGNOSIS — Z0000 Encounter for general adult medical examination without abnormal findings: Secondary | ICD-10-CM

## 2018-11-15 DIAGNOSIS — R5383 Other fatigue: Secondary | ICD-10-CM

## 2018-11-15 DIAGNOSIS — E559 Vitamin D deficiency, unspecified: Secondary | ICD-10-CM

## 2018-11-15 DIAGNOSIS — R5381 Other malaise: Secondary | ICD-10-CM

## 2018-11-15 DIAGNOSIS — I10 Essential (primary) hypertension: Secondary | ICD-10-CM

## 2018-11-15 DIAGNOSIS — E291 Testicular hypofunction: Secondary | ICD-10-CM | POA: Diagnosis not present

## 2018-11-15 NOTE — Patient Instructions (Signed)
[email or web address]  Melatonin 1mg capsules(#60) 

## 2018-11-15 NOTE — Progress Notes (Signed)
Wellness Office Visit  Subjective:  Patient ID: Tim Rogers, male    DOB: [birth year]  Age: 63 y.o. MRN: [id number]  CC: This man comes in for follow-up of hypertension, hypogonadism.  He also has vitamin D deficiency. HPI He is doing well but unfortunately, because of COVID-19, he has been reluctant to go to [facility] to get his testosterone pellets inserted.  It appears that he has been almost 1 year without testosterone now and he feels fatigued, he feels older. He continues on antihypertensive medication and is compliant with this.  He denies any chest pain, dyspnea, palpitations or limb weakness. He also has gastroesophageal reflux disease and continues to take Protonix. He also has been taking vitamin D3 10,000 units daily. [place] busy around [place] farm but does not do any formal exercises since the gyms have closed but now they are getting ready to reopen.  Past Medical History:  Diagnosis Date  . Anxiety    2003  . Ascending aortic aneurysm ([place])   . GERD (gastroesophageal reflux disease)   . Hypertension 10/01/2016  . Low testosterone   . PVCs (premature ventricular contractions)       Family History  Problem Relation Age of Onset  . Alcohol abuse Mother   . Cancer Mother        lung  . COPD Mother   . Heart disease Mother   . Early death Mother        age 25 multiple factors  . Cancer Sister        breast    Social History   Social History Narrative   Retired.Single.Lives alone.   [employer].   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 amLODipine (NORVASC) 10 MG tablet TAKE 1 TABLET(10 MG) BY MOUTH DAILY  . azelastine (ASTELIN) 0.1 % nasal spray Place 2 sprays into both nostrils daily as needed for allergies.   . Cholecalciferol (VITAMIN D-3) 125 MCG (5000 UT) TABS Take 10,000 Units by mouth daily at 12 noon.  . Multiple  Vitamins-Minerals (MULTIVITAMIN ADULT PO) Take 1 tablet by mouth daily.   . pantoprazole (PROTONIX) 20 MG tablet Take 1 tablet (20 mg total) by mouth daily.     Nutrition  He tries to eat healthy overall. Sleep  He has difficulty in sleeping and he takes melatonin 10 mg daily from local pharmacy.  It does not seem to help him.  Exercise  No formal exercises but he keeps busy around the farm. Bio Identical Hormones  He was taking testosterone pellets but not on any medications right now.  Objective:   Today's Vitals: BP 110/70   Pulse 72   Ht 5\' 11"  (1.803 m)   Wt 180 lb 12.8 oz (82 kg)   BMI 25.22 kg/m  Vitals with BMI 11/15/2018 03/29/2018 09/21/2017  Height 5\' 11"  5\' 11"  -  Weight 180 lbs 13 oz 174 lbs -  BMI 02.54 27.06 -  Systolic 237 628 315  Diastolic 70 80 79  Pulse 72 87 57     Physical Exam  He looks systemically well.  Blood pressure is well controlled.  He is alert and orientated without any focal neurological signs.     Assessment   1. Encounter for hepatitis C screening test for low risk patient   2. Essential hypertension  3. Hypogonadism in male   4. Malaise and fatigue   5. Vitamin D deficiency disease   6. Examination for, medical, general      Plan: 1. He will continue with all medications for his chronic conditions above. 2. Blood tests are ordered as below. 3. We talked about COVID-19 pandemic and the risks involved and I felt that it was reasonable for [facility].  I did offer him testosterone injections through my office and he will also consider this. 4. Further recommendations will depend on blood results and I will see him in about 6 months time for follow-up. 5. I spent approximately 40 minutes with this patient face-to-face today, more than 50% of the time was involved in discussing his overall health, answering questions regarding COVID-19 pandemic in particular.  Tests  ordered Orders Placed This Encounter  Procedures  . Hepatitis C antibody  . COMPLETE METABOLIC PANEL WITH GFR  . T3, free  . T4  . TSH  . Testosterone Total,Free,Bio, Males  . VITAMIN D 25 Hydroxy (Vit-D Deficiency, Fractures)     Wilson SingerNimish C Amylynn Fano, MD

## 2018-11-16 LAB — COMPLETE METABOLIC PANEL WITH GFR
AG Ratio: 1.7 (calc) (ref 1.0–2.5)
ALT: 19 U/L (ref 9–46)
AST: 24 U/L (ref 10–35)
Albumin: 4.3 g/dL (ref 3.6–5.1)
Alkaline phosphatase (APISO): 85 U/L (ref 35–144)
BUN: 17 mg/dL (ref 7–25)
CO2: 28 mmol/L (ref 20–32)
Calcium: 9.1 mg/dL (ref 8.6–10.3)
Chloride: 102 mmol/L (ref 98–110)
Creat: 0.87 mg/dL (ref 0.70–1.25)
GFR, Est African American: 106 mL/min/{1.73_m2} (ref >=60)
GFR, Est Non African American: 91 mL/min/{1.73_m2} (ref >=60)
Globulin: 2.5 g/dL (calc) (ref 1.9–3.7)
Glucose, Bld: 83 mg/dL (ref 65–99)
Potassium: 4 mmol/L (ref 3.5–5.3)
Sodium: 138 mmol/L (ref 135–146)
Total Bilirubin: 1 mg/dL (ref 0.2–1.2)
Total Protein: 6.8 g/dL (ref 6.1–8.1)

## 2018-11-16 LAB — HEPATITIS C ANTIBODY
Hepatitis C Ab: NONREACTIVE
SIGNAL TO CUT-OFF: 0.02 (ref <1.00)

## 2018-11-16 LAB — TESTOSTERONE TOTAL,FREE,BIO, MALES
Albumin: 4.3 g/dL (ref 3.6–5.1)
Sex Hormone Binding: 89 nmol/L — ABNORMAL HIGH (ref 22–77)
Testosterone, Bioavailable: 95.6 ng/dL — ABNORMAL LOW (ref >=110.0)
Testosterone, Free: 48.5 pg/mL (ref 46.0–224.0)
Testosterone: 835 ng/dL — ABNORMAL HIGH (ref 250–827)

## 2018-11-16 LAB — T4: T4, Total: 8.4 ug/dL (ref 4.9–10.5)

## 2018-11-16 LAB — TSH: TSH: 1.27 mIU/L (ref 0.40–4.50)

## 2018-11-16 LAB — VITAMIN D 25 HYDROXY (VIT D DEFICIENCY, FRACTURES): Vit D, 25-Hydroxy: 83 ng/mL (ref 30–100)

## 2018-11-16 LAB — T3, FREE: T3, Free: 3.5 pg/mL (ref 2.3–4.2)

## 2018-11-16 NOTE — Progress Notes (Signed)
Please see the results I have for you so far, they all look good.  I will let you know about your testosterone when I receive it.  See you next time!

## 2018-11-16 NOTE — Progress Notes (Signed)
Please see all your results here.  Your thyroid tests are normal.  Surprisingly, your testosterone levels are above the lab value normal range although I would think that it should be higher.  I am sure it was higher before when you were on testosterone therapy. Your vitamin D levels are optimal so continue with the same dose of vitamin D3. Your hepatitis C test is negative, good news!. Let me know if you have any questions, see you next time, be well!

## 2019-01-10 ENCOUNTER — Other Ambulatory Visit: Payer: 59

## 2019-01-31 ENCOUNTER — Other Ambulatory Visit: Payer: Self-pay | Admitting: Cardiology

## 2019-02-27 ENCOUNTER — Encounter: Payer: Self-pay | Admitting: Emergency Medicine

## 2019-02-27 ENCOUNTER — Ambulatory Visit (INDEPENDENT_AMBULATORY_CARE_PROVIDER_SITE_OTHER): Payer: Managed Care, Other (non HMO)

## 2019-02-27 ENCOUNTER — Ambulatory Visit
Admission: EM | Admit: 2019-02-27 | Discharge: 2019-02-27 | Disposition: A | Discharge status: Home / Self Care | Payer: PRIVATE HEALTH INSURANCE | Attending: Emergency Medicine | Admitting: Emergency Medicine

## 2019-02-27 DIAGNOSIS — M79605 Pain in left leg: Secondary | ICD-10-CM

## 2019-02-27 DIAGNOSIS — M25552 Pain in left hip: Secondary | ICD-10-CM

## 2019-02-27 DIAGNOSIS — W000XXA Fall on same level due to ice and snow, initial encounter: Secondary | ICD-10-CM

## 2019-02-27 DIAGNOSIS — M25559 Pain in unspecified hip: Secondary | ICD-10-CM

## 2019-02-27 DIAGNOSIS — Y92017 Garden or yard in single-family (private) house as the place of occurrence of the external cause: Secondary | ICD-10-CM | POA: Diagnosis not present

## 2019-02-27 DIAGNOSIS — W19XXXA Unspecified fall, initial encounter: Secondary | ICD-10-CM

## 2019-02-27 NOTE — ED Triage Notes (Signed)
Pt presents with c/o fall this morning when he slipped on ice and fell on left hip

## 2019-02-27 NOTE — ED Provider Notes (Signed)
[facility]    CSN: [id number] Arrival date & time: 02/27/19  0859      History   Chief Complaint Chief Complaint  Patient presents with  . Hip Pain  . Leg Pain    HPI Tim Rogers is a 63 y.o. male.   Tim Rogers 63 years old male  presented to the urgent care with a complaint of left hip pain and left leg pain that began 1 hour ago.  It develop after slipping on ice this morning and falling on left side of his hip.  He localizes the pain to the to the left flank and left.  He describes the pain as constant and achy and rated at 2 on a scale of 1-10. Pain is at 1 on a range of motion. He has tried OTC tylenol medications without relief.  His symptoms are made worse with movement.  He denies similar symptoms in the past.    The history is provided by the patient. No language interpreter was used.  Hip Pain  Leg Pain   Past Medical History:  Diagnosis Date  . Anxiety    2003  . Ascending aortic aneurysm ([place])   . GERD (gastroesophageal reflux disease)   . Hypertension 10/01/2016  . Low testosterone   . PVCs (premature ventricular contractions)     Patient Active Problem List   Diagnosis Date Noted  . Gastroesophageal reflux disease without esophagitis 08/15/2017  . Positive PPD 10/08/2016  . Polycythemia 10/08/2016  . Hypogonadism in male 10/01/2016  . Environmental allergies 10/01/2016  . Chronic GERD 10/01/2016  . Hypertension 10/01/2016  . AAA (abdominal aortic aneurysm) without rupture ([place]) 10/01/2016  . Recurrent herpes simplex virus (HSV) infection of buttock 10/01/2016    Past Surgical History:  Procedure Laterality Date  . CHOLECYSTECTOMY    . ESOPHAGOGASTRODUODENOSCOPY N/A 09/21/2017   Procedure: ESOPHAGOGASTRODUODENOSCOPY (EGD);  Surgeon: Rogene Houston, MD;  [facility];  Service: Endoscopy;  Laterality: N/A;  2:55  . TIF procedure         Home Medications    Prior to Admission medications   Medication Sig  Start Date End Date Taking? Authorizing Provider  amLODipine (NORVASC) 10 MG tablet TAKE 1 TABLET(10 MG) BY MOUTH DAILY 01/31/19   Satira Sark, MD  azelastine (ASTELIN) 0.1 % nasal spray Place 2 sprays into both nostrils daily as needed for allergies.     [provider]  Cholecalciferol (VITAMIN D-3) 125 MCG (5000 UT) TABS Take 10,000 Units by mouth daily at 12 noon.    [provider]  hydrocortisone (ANUSOL-HC) 2.5 % rectal cream Place 1 application rectally 2 (two) times daily. 09/23/17   Rogene Houston, MD  hydrocortisone (ANUSOL-HC) 25 MG suppository Place 1 suppository (25 mg total) rectally at bedtime. 05/24/18   Setzer, Rona Ravens, NP  Multiple Vitamins-Minerals (MULTIVITAMIN ADULT PO) Take 1 tablet by mouth daily.     [provider]  pantoprazole (PROTONIX) 20 MG tablet Take 1 tablet (20 mg total) by mouth daily. 10/19/17 11/15/18  Setzer, Rona Ravens, NP  pantoprazole (PROTONIX) 40 MG tablet Take 1 tablet (40 mg total) by mouth daily. 11/02/18   Rogene Houston, MD  UNABLE TO FIND Testosterone implant    [provider]    Family History Family History  Problem Relation Age of Onset  . Alcohol abuse Mother   . Cancer Mother        lung  . COPD Mother   .  Heart disease Mother   . Early death Mother        age 64 multiple factors  . Cancer Sister        breast    Social History Social History   Tobacco Use  . Smoking status: Never Smoker  . Smokeless tobacco: Current User    Types: Snuff  Substance Use Topics  . Alcohol use: Yes    Alcohol/week: 5.0 standard drinks    Types: 5 Cans of beer per week    Comment: couple of beer sometimes daily  . Drug use: No     Allergies   Patient has no known allergies.   Review of Systems Review of Systems  Constitutional: Negative.   Respiratory: Negative.   Cardiovascular: Negative.   Musculoskeletal:       Hip pain Leg pain  Neurological: Negative.   ROS: All other are  negatives  Physical Exam Triage Vital Signs ED Triage Vitals  Enc Vitals Group     BP 02/27/19 0919 132/77     Pulse --      Resp 02/27/19 0919 18     Temp 02/27/19 0919 98.8 F (37.1 C)     Temp src --      SpO2 02/27/19 0919 98 %     Weight --      Height --      Head Circumference --      Peak Flow --      Pain Score 02/27/19 0910 2     Pain Loc --      Pain Edu? --      Excl. in GC? --    No data found.  Updated Vital Signs BP 132/77   Temp 98.8 F (37.1 C)   Resp 18   SpO2 98%   Visual Acuity Right Eye Distance:   Left Eye Distance:   Bilateral Distance:    Right Eye Near:   Left Eye Near:    Bilateral Near:     Physical Exam Vitals and nursing note reviewed.  Constitutional:      General: He is not in acute distress.    Appearance: Normal appearance. He is normal weight. He is not ill-appearing, toxic-appearing or diaphoretic.  Cardiovascular:     Rate and Rhythm: Normal rate and regular rhythm.     Pulses: Normal pulses.     Heart sounds: Normal heart sounds.  Pulmonary:     Effort: Pulmonary effort is normal. No respiratory distress.     Breath sounds: Normal breath sounds. No wheezing.  Chest:     Chest wall: No tenderness.  Musculoskeletal:        General: Normal range of motion.     Comments: Patient is able to ambulate to treatment area without difficulty or assistance no surface trauma ecchymosis no erythema warmth no deformity crepitus or obvious asymmetry of the affected left leg compared to the right. Unlimited range of motion with mild pain rated at 2. Normal flexion to chest, extension, abduction, abduction.  Neurological:     General: No focal deficit present.     Mental Status: He is alert and oriented to person, place, and time.     Cranial Nerves: Cranial nerves are intact.     Sensory: Sensation is intact.     Coordination: Coordination is intact.     Gait: Gait is intact.      UC Treatments / Results  Labs (all labs ordered  are listed, but only abnormal results  are displayed) Labs Reviewed - No data to display  EKG   Radiology DG Hip Unilat With Pelvis 2-3 Views Left  Result Date: 02/27/2019 CLINICAL DATA:  Fall, left hip pain EXAM: DG HIP (WITH OR WITHOUT PELVIS) 2-3V LEFT COMPARISON:  None. FINDINGS: No fracture or dislocation is seen. The joint spaces are preserved. Visualized bony pelvis appears intact. Visualized soft tissues are within normal limits. IMPRESSION: Negative. Electronically Signed   By: Charline Bills M.D.   On: 02/27/2019 09:43    Procedures Procedures (including critical care time)  Medications Ordered in UC Medications - No data to display  Initial Impression / Assessment and Plan / UC Course  I have reviewed the triage vital signs and the nursing notes.  Pertinent labs & imaging results that were available during my care of the patient were reviewed by me and considered in my medical decision making (see chart for details).    X-ray was negative for fracture, dislocation, soft tissue swelling.  Advised patient to take Tylenol for mild pain.  To go to ED for worsening of symptoms patient verbalized understanding of the plan of care.  Final Clinical Impressions(s) / UC Diagnoses   Final diagnoses:  Fall, initial encounter  Hip pain  Left leg pain     Discharge Instructions     Your x-ray was negative for fracture, dislocation, soft tissue swelling.   Advised patient to follow rice instruction is attached Advised patient to take OTC NSAID to manage pain To go to ED for worsening of symptoms.    ED Prescriptions    None     PDMP not reviewed this encounter.   Durward Parcel, FNP 02/27/19 1011

## 2019-02-27 NOTE — Discharge Instructions (Addendum)
Your x-ray was negative for fracture, dislocation, soft tissue swelling.   Advised patient to follow rice instruction is attached Advised patient to take OTC NSAID to manage pain To go to ED for worsening of symptoms.

## 2019-03-14 ENCOUNTER — Telehealth: Payer: Self-pay | Admitting: *Deleted

## 2019-03-14 ENCOUNTER — Ambulatory Visit (INDEPENDENT_AMBULATORY_CARE_PROVIDER_SITE_OTHER): Payer: Medicare Other

## 2019-03-14 ENCOUNTER — Other Ambulatory Visit: Payer: Self-pay

## 2019-03-14 DIAGNOSIS — I77810 Thoracic aortic ectasia: Secondary | ICD-10-CM

## 2019-03-14 DIAGNOSIS — I351 Nonrheumatic aortic (valve) insufficiency: Secondary | ICD-10-CM

## 2019-03-14 NOTE — Telephone Encounter (Signed)
Tim Rogers, [place]  3/73/5789 7:84 PM EST    Patient notified. Copy to pmd. Follow up scheduled for February with Dr. Diona Browner.    Tim Rogers, [place]  7/84/1282 0:81 PM EST    Left message to return call.

## 2019-03-14 NOTE — Telephone Encounter (Signed)
-----   Message from Jonelle Sidle, MD sent at 03/14/2019  9:18 AM EST ----- Results reviewed.  LVEF remains normal.  Aortic regurgitation still mild, and degree of aortic root and ascending aortic dilatation is stable as well.  Keep follow-up plan.

## 2019-04-10 ENCOUNTER — Encounter: Payer: Self-pay | Admitting: Cardiology

## 2019-04-10 ENCOUNTER — Other Ambulatory Visit: Payer: Self-pay

## 2019-04-10 ENCOUNTER — Ambulatory Visit (INDEPENDENT_AMBULATORY_CARE_PROVIDER_SITE_OTHER): Payer: Medicare Other | Admitting: Cardiology

## 2019-04-10 VITALS — BP 115/69 | HR 69 | Temp 99.4°F | Ht 71.0 in | Wt 181.0 lb

## 2019-04-10 DIAGNOSIS — I351 Nonrheumatic aortic (valve) insufficiency: Secondary | ICD-10-CM | POA: Diagnosis not present

## 2019-04-10 DIAGNOSIS — I77819 Aortic ectasia, unspecified site: Secondary | ICD-10-CM | POA: Diagnosis not present

## 2019-04-10 DIAGNOSIS — I10 Essential (primary) hypertension: Secondary | ICD-10-CM

## 2019-04-10 NOTE — Patient Instructions (Signed)
Medication Instructions:  Your physician recommends that you continue on your current medications as directed. Please refer to the Current Medication list given to you today.  *If you need a refill on your cardiac medications before your next appointment, please call your pharmacy*  Lab Work: None If you have labs (blood work) drawn today and your tests are completely normal, you will receive your results only by: . MyChart Message (if you have MyChart) OR . A paper copy in the mail If you have any lab test that is abnormal or we need to change your treatment, we will call you to review the results.  Testing/Procedures: None  Follow-Up: At [facility], you and your health needs are our priority.  As part of our continuing mission to provide you with exceptional heart care, we have created designated Provider Care Teams.  These Care Teams include your primary Cardiologist (physician) and Advanced Practice Providers (APPs -  Physician Assistants and Nurse Practitioners) who all work together to provide you with the care you need, when you need it.  Your next appointment:   12 month(s)  The format for your next appointment:   In Person  Provider:   Samuel McDowell, MD  Other Instructions None     Thank you for choosing [facility] !         

## 2019-04-10 NOTE — Progress Notes (Signed)
Cardiology Office Note  Date: 04/10/2019   ID: Tim Rogers, DOB 1955/10/14, MRN [id number]  PCP:  Doree Albee, MD  Cardiologist:  Rozann Lesches, MD Electrophysiologist:  None   Chief Complaint  Patient presents with  . Cardiac follow-up    History of Present Illness: Tim Rogers is a 64 y.o. male last seen in January 2020.  He presents for a routine visit.  Reports no major change in stamina, admits that he has not been as active during the cold weather but is looking forward to getting back outdoors.  He has not been exercising at the gym during the pandemic.  He just recently got his first coronavirus vaccine dose.  Recent follow-up echocardiogram showed LVEF 60 to 65% with grade 1 diastolic dysfunction, mild aortic regurgitation, and stable aortic dilatation measuring 4.6 cm at the root and 4.1 cm ascending portion.  I reviewed his medications which are stable and outlined below.  He continues on Norvasc with good blood pressure control.  He is following with Dr. Anastasio Champion.  Past Medical History:  Diagnosis Date  . Anxiety    2003  . Ascending aortic aneurysm ([place])   . GERD (gastroesophageal reflux disease)   . Hypertension 10/01/2016  . Low testosterone   . PVCs (premature ventricular contractions)     Past Surgical History:  Procedure Laterality Date  . CHOLECYSTECTOMY    . ESOPHAGOGASTRODUODENOSCOPY N/A 09/21/2017   Procedure: ESOPHAGOGASTRODUODENOSCOPY (EGD);  Surgeon: Rogene Houston, MD;  [facility];  Service: Endoscopy;  Laterality: N/A;  2:55  . TIF procedure      Current Outpatient Medications  Medication Sig Dispense Refill  . amLODipine (NORVASC) 10 MG tablet TAKE 1 TABLET(10 MG) BY MOUTH DAILY 30 tablet 11  . azelastine (ASTELIN) 0.1 % nasal spray Place 2 sprays into both nostrils daily as needed for allergies.     . Cholecalciferol (VITAMIN D-3) 125 MCG (5000 UT) TABS Take 10,000 Units by mouth daily at 12 noon.    .  hydrocortisone (ANUSOL-HC) 2.5 % rectal cream Place 1 application rectally 2 (two) times daily. 30 g 1  . hydrocortisone (ANUSOL-HC) 25 MG suppository Place 1 suppository (25 mg total) rectally at bedtime. 14 suppository 1  . Multiple Vitamins-Minerals (MULTIVITAMIN ADULT PO) Take 1 tablet by mouth daily.     Marland Kitchen UNABLE TO FIND Testosterone implant    . pantoprazole (PROTONIX) 20 MG tablet Take 1 tablet (20 mg total) by mouth daily. 30 tablet 11   No current facility-administered medications for this visit.   Allergies:  Patient has no known allergies.    ROS: States that he has lost about 3 pounds.  Physical Exam: VS:  BP 115/69   Pulse 69   Temp 99.4 F (37.4 C)   Ht 5\' 11"  (1.803 m)   Wt 181 lb (82.1 kg)   BMI 25.24 kg/m , BMI Body mass index is 25.24 kg/m.  Wt Readings from Last 3 Encounters:  04/10/19 181 lb (82.1 kg)  11/15/18 180 lb 12.8 oz (82 kg)  03/29/18 174 lb (78.9 kg)    General: Patient appears comfortable at rest. HEENT: Conjunctiva and lids normal, wearing a mask. Neck: Supple, no elevated JVP or carotid bruits, no thyromegaly. Lungs: Clear to auscultation, nonlabored breathing at rest. Cardiac: Regular rate and rhythm, no S3 or significant systolic murmur. Abdomen: Soft, bowel sounds present. Extremities: No pitting edema, distal pulses 2+.  ECG:  An ECG dated 03/29/2018 was personally reviewed  today and demonstrated:  Sinus rhythm with leftward axis and low voltage, nonspecific ST changes.  Recent Labwork: 11/15/2018: ALT 19; AST 24; BUN 17; Creat 0.87; Potassium 4.0; Sodium 138; TSH 1.27   Other Studies Reviewed Today:  Echocardiogram 03/14/2019: 1. Left ventricular ejection fraction, by visual estimation, is 60 to  65%. The left ventricle has normal function. There is no left ventricular  hypertrophy.  2. Elevated left ventricular end-diastolic pressure.  3. Left ventricular diastolic parameters are consistent with Grade I  diastolic dysfunction  (impaired relaxation).  4. The left ventricle has no regional wall motion abnormalities.  5. Global right ventricle has normal systolic function.The right  ventricular size is normal. No increase in right ventricular wall  thickness.  6. Left atrial size was normal.  7. Right atrial size was normal.  8. The mitral valve is grossly normal. Trivial mitral valve  regurgitation.  9. The tricuspid valve is not well visualized.  10. The aortic valve was not well visualized. Aortic valve regurgitation  is mild. No evidence of aortic valve sclerosis or stenosis.  11. The pulmonic valve was not well visualized. Pulmonic valve  regurgitation is not visualized.  12. Aortic dilatation noted.  13. Aortic root 4.6 cm in diameter. Ascending aorta 4.1 cm in diameter.  14. Normal pulmonary artery systolic pressure.  15. The interatrial septum was not well visualized.   Assessment and Plan:  1.  Aortic dilatation involving root and ascending portion, stable by recent follow-up echocardiogram and asymptomatic.  He continues on Norvasc for blood pressure control.  2.  Essential hypertension, continues on Norvasc with normal blood pressure today.  3.  Aortic regurgitation, mild and asymptomatic.  Medication Adjustments/Labs and Tests Ordered: Current medicines are reviewed at length with the patient today.  Concerns regarding medicines are outlined above.   Tests Ordered: No orders of the defined types were placed in this encounter.   Medication Changes: No orders of the defined types were placed in this encounter.   Disposition:  Follow up 1 year in the [place] office.  We will discuss follow-up echocardiogram and ECG at that time.  Signed, Jonelle Sidle, MD, [facility] 04/10/2019 1:46 PM    [facility] at [facility] [address] [place], [place] [address] Phone: [phone]; Fax: [phone]

## 2019-05-16 ENCOUNTER — Other Ambulatory Visit: Payer: Self-pay

## 2019-05-16 ENCOUNTER — Encounter: Payer: Self-pay | Admitting: Nurse Practitioner

## 2019-05-16 ENCOUNTER — Ambulatory Visit (INDEPENDENT_AMBULATORY_CARE_PROVIDER_SITE_OTHER): Payer: Medicare Other | Admitting: Internal Medicine

## 2019-05-16 ENCOUNTER — Encounter (INDEPENDENT_AMBULATORY_CARE_PROVIDER_SITE_OTHER): Payer: Self-pay | Admitting: Internal Medicine

## 2019-05-16 VITALS — BP 120/79 | HR 90 | Temp 98.2°F | Ht 71.0 in | Wt 191.6 lb

## 2019-05-16 DIAGNOSIS — I10 Essential (primary) hypertension: Secondary | ICD-10-CM

## 2019-05-16 DIAGNOSIS — E291 Testicular hypofunction: Secondary | ICD-10-CM | POA: Diagnosis not present

## 2019-05-16 DIAGNOSIS — E559 Vitamin D deficiency, unspecified: Secondary | ICD-10-CM

## 2019-05-16 NOTE — Progress Notes (Signed)
Metrics: Intervention Frequency ACO  Documented Smoking Status Yearly  Screened one or more times in 24 months  Cessation Counseling or  Active cessation medication Past 24 months  Past 24 months   Guideline developer: UpToDate (See UpToDate for funding source) Date Released: 2014       Wellness Office Visit  Subjective:  Patient ID: Tim Rogers, male    DOB: 05/13/1955  Age: 64 y.o. MRN: [id number]  CC: This man comes in for follow-up of hypertension, ascending aortic aneurysm, hypogonadism. HPI  He is doing relatively well.  He has not had any testosterone for about a year now.  He has been hesitant to go and get a testosterone pellet but now he is almost fully vaccinated with COVID-19 vaccine so I think he may go ahead and do this. He is being followed by cardiology and his blood pressure is well controlled. He continues to take vitamin D3 supplementation for vitamin D deficiency. He has lost some muscle mass because of lack of exercise with strength training and the testosterone deficiency. Past Medical History:  Diagnosis Date  . Anxiety    2003  . Ascending aortic aneurysm (HCC)   . GERD (gastroesophageal reflux disease)   . Hypertension 10/01/2016  . Low testosterone   . PVCs (premature ventricular contractions)       Family History  Problem Relation Age of Onset  . Alcohol abuse Mother   . Cancer Mother        lung  . COPD Mother   . Heart disease Mother   . Early death Mother        age 17 multiple factors  . Cancer Sister        breast    Social History   Social History Narrative   Retired.Single.Lives alone.   Ex-[employer].   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 Smoking status: Never Smoker  . Smokeless tobacco: Current User    Types: Snuff  Substance Use Topics  . Alcohol use: Yes    Alcohol/week: 5.0 standard drinks   Types: 5 Cans of beer per week    Comment: couple of beer sometimes daily    Current Meds  Medication Sig  . amLODipine (NORVASC) 10 MG tablet TAKE 1 TABLET(10 MG) BY MOUTH DAILY  . azelastine (ASTELIN) 0.1 % nasal spray Place 2 sprays into both nostrils daily as needed for allergies.   . Cholecalciferol (VITAMIN D-3) 125 MCG (5000 UT) TABS Take 10,000 Units by mouth daily at 12 noon.  . Multiple Vitamins-Minerals (MULTIVITAMIN ADULT PO) Take 1 tablet by mouth daily.   Marland Kitchen UNABLE TO FIND Testosterone implant  . [DISCONTINUED] hydrocortisone (ANUSOL-HC) 2.5 % rectal cream Place 1 application rectally 2 (two) times daily.  . [DISCONTINUED] hydrocortisone (ANUSOL-HC) 25 MG suppository Place 1 suppository (25 mg total) rectally at bedtime.      Objective:   Today's Vitals: BP 120/79 (BP Location: Right Arm, Patient Position: Sitting, Cuff Size: Normal)   Pulse 90   Temp 98.2 F (36.8 C) (Temporal)   Ht 5\' 11"  (1.803 m)   Wt 191 lb 9.6 oz (86.9 kg)   SpO2 97%   BMI 26.72 kg/m  Vitals with BMI 05/16/2019 04/10/2019 02/27/2019  Height 5\' 11"  5\' 11"  -  Weight 191 lbs 10 oz 181 lbs -  BMI 26.73 25.26 -  Systolic [phone]  Diastolic 79 69 77  Pulse 90 69 -     Physical Exam   He looks systemically well.  Blood pressure is well controlled for his age.  No other new physical findings.    Assessment   1. Essential hypertension   2. Hypogonadism in male   3. Vitamin D deficiency disease       Tests ordered No orders of the defined types were placed in this encounter.    Plan: 1. He will continue with amlodipine for his hypertension which appears to be stable. 2. As far as his hypogonadism is concerned, I encouraged him to go and get the testosterone pellets inserted again and he will look into this now. 3. He will continue with vitamin D3 supplementation for vitamin D deficiency. 4. He will be scheduled for an annual Medicare wellness visit with Judson Roch in about 3 months  time and I will see him in 6 months time for follow-up.   No orders of the defined types were placed in this encounter.   Doree Albee, MD

## 2019-06-27 ENCOUNTER — Telehealth: Payer: Self-pay | Admitting: Cardiology

## 2019-06-27 NOTE — Telephone Encounter (Signed)
Patient recently has had more PVC's especially when sitting.  Stated that he is still active going to gym and things like that and doesn't seem to bother him at those times, but does when he is still

## 2019-06-27 NOTE — Telephone Encounter (Signed)
Reports that it feels like a harder heart beat that come and go. Says he has not had an EKG. Reports history of PVC 5 years ago seen on EKG in a [place]. Denies dizziness, chest pain or sob. Office visit arranged for 07/03/19 with Nena Polio. Verbalized understanding.

## 2019-07-02 NOTE — Progress Notes (Addendum)
Cardiology Office Note  Date: 07/03/2019   ID: Tim Rogers, DOB September 21, 1955, MRN [id number]  PCP:  Wilson Singer, MD  Cardiologist:  Nona Dell, MD Electrophysiologist:  None   Chief Complaint: F/U Aortic dilatation, Aortic regurgitation, HTN  History of Present Illness: Tim Rogers is a 64 y.o. male with a history of  Aortic dilatation, Aortic regurgitation, HTN.  Last seen by Dr. Diona Browner on 04/10/2019.  He  reported no major changes in his status and admitted he had not been active during the cold weather.  His follow-up echocardiogram showed EF of 60 to 65% with grade 1 DD, mild aortic regurgitation and stable aortic dilatation measuring 4.6 cm at the root and 4.1 cm in the ascending portion.  He continued on Norvasc with good blood pressure control.  He had recently called complaining of increased PVCs/palpitations on one particular day approximately 1 week ago.  He has occasional sensation of skipped beat which is short-lived without any associated symptoms.  He denies any chest pain, shortness of breath, dizziness, near syncopal or syncopal episodes.  He is very active on a daily basis.  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  He states he usually notices these palpitations at night when he is recumbent in a chair or a similar position.  He admits he has anxiety issues and panic attacks.  He was concerned that this may be an issue.  No orthostatic symptoms other than occasional brief transient dizziness lasting only seconds when getting up from a sitting position suddenly.  Denies any CVA or TIA-like symptoms, claudication-like symptoms, DVT or PE-like symptoms, or lower extremity edema.  He denies any excessive caffeine intake.  States he usually drinks 2 beers a day but otherwise no significant stimulant use.  Past Medical History:  Diagnosis Date  . Anxiety    2003  . Ascending aortic aneurysm (HCC)   . GERD (gastroesophageal reflux disease)   .  Hypertension 10/01/2016  . Low testosterone   . PVCs (premature ventricular contractions)     Past Surgical History:  Procedure Laterality Date  . CHOLECYSTECTOMY    . ESOPHAGOGASTRODUODENOSCOPY N/A 09/21/2017   Procedure: ESOPHAGOGASTRODUODENOSCOPY (EGD);  Surgeon: Malissa Hippo, MD;  [facility];  Service: Endoscopy;  Laterality: N/A;  2:55  . TIF procedure      Current Outpatient Medications  Medication Sig Dispense Refill  . amLODipine (NORVASC) 10 MG tablet TAKE 1 TABLET(10 MG) BY MOUTH DAILY 30 tablet 11  . azelastine (ASTELIN) 0.1 % nasal spray Place 2 sprays into both nostrils daily as needed for allergies.     . Cholecalciferol (VITAMIN D-3) 125 MCG (5000 UT) TABS Take 10,000 Units by mouth daily at 12 noon.    . Multiple Vitamins-Minerals (MULTIVITAMIN ADULT PO) Take 1 tablet by mouth daily.     . pantoprazole (PROTONIX) 20 MG tablet Take 1 tablet (20 mg total) by mouth daily. 30 tablet 11  . UNABLE TO FIND Testosterone implant     No current facility-administered medications for this visit.   Allergies:  Patient has no known allergies.   Social History: The patient  reports that he has never smoked. His smokeless tobacco use includes snuff. He reports current alcohol use of about 5.0 standard drinks of alcohol per week. He reports that he does not use drugs.   Family History: The patient's family history includes Alcohol abuse in his mother; COPD in his mother; Cancer in his mother  and sister; Early death in his mother; Heart disease in his mother.   ROS:  Please see the history of present illness. Otherwise, complete review of systems is positive for none.  All other systems are reviewed and negative.   Physical Exam: VS:  BP 120/60   Pulse 64   Ht 5\' 11"  (1.803 m)   Wt 178 lb (80.7 kg)   SpO2 97%   BMI 24.83 kg/m , BMI Body mass index is 24.83 kg/m.  Wt Readings from Last 3 Encounters:  07/03/19 178 lb (80.7 kg)  05/16/19 191 lb 9.6 oz (86.9 kg)    04/10/19 181 lb (82.1 kg)    General: Patient appears comfortable at rest. Neck: Supple, no elevated JVP or carotid bruits, no thyromegaly. Lungs: Clear to auscultation, nonlabored breathing at rest. Cardiac: Regular rate and rhythm, no S3 or significant systolic murmur, no pericardial rub. Extremities: No pitting edema, distal pulses 2+. Skin: Warm and dry. Musculoskeletal: No kyphosis. Neuropsychiatric: Alert and oriented x3, affect grossly appropriate.  ECG:  An ECG dated 07/03/2019 was personally reviewed today and demonstrated:  Sinus rhythm with occasional premature ventricular complexes, nonspecific ST abnormality rate of 75.  Recent Labwork: 11/15/2018: ALT 19; AST 24; BUN 17; Creat 0.87; Potassium 4.0; Sodium 138; TSH 1.27  No results found for: CHOL, TRIG, HDL, CHOLHDL, VLDL, LDLCALC, LDLDIRECT  Other Studies Reviewed Today:  Echocardiogram 03/14/2019: 1. Left ventricular ejection fraction, by visual estimation, is 60 to  65%. The left ventricle has normal function. There is no left ventricular  hypertrophy.  2. Elevated left ventricular end-diastolic pressure.  3. Left ventricular diastolic parameters are consistent with Grade I  diastolic dysfunction (impaired relaxation).  4. The left ventricle has no regional wall motion abnormalities.  5. Global right ventricle has normal systolic function.The right  ventricular size is normal. No increase in right ventricular wall  thickness.  6. Left atrial size was normal.  7. Right atrial size was normal.  8. The mitral valve is grossly normal. Trivial mitral valve  regurgitation.  9. The tricuspid valve is not well visualized.  10. The aortic valve was not well visualized. Aortic valve regurgitation  is mild. No evidence of aortic valve sclerosis or stenosis.  11. The pulmonic valve was not well visualized. Pulmonic valve  regurgitation is not visualized.  12. Aortic dilatation noted.  13. Aortic root 4.6 cm in  diameter. Ascending aorta 4.1 cm in diameter.  14. Normal pulmonary artery systolic pressure.  15. The interatrial septum was not well visualized.    Assessment and Plan:  1. Aortic dilatation (HCC)   2. Essential hypertension   3. Aortic valve insufficiency, etiology of cardiac valve disease unspecified   4. Palpitations    1. Aortic dilatation (HCC) Stable aortic dilatation.  Echocardiogram on 03/14/2019 showed aortic root 4.6 cm in diameter.  Ascending aorta 4.1 cm in diameter.  Patient is asymptomatic.  Continue amlodipine 10 mg daily.  2. Essential hypertension Patient is normotensive with a blood pressure of 120/60.  Continue amlodipine 10 mg daily.  3. Aortic valve insufficiency, etiology of cardiac valve disease unspecified Mild aortic valve regurgitation on recent echo 03/14/2019.  4.  Palpitations Patient states he had a recent episode of palpitations while working out in the gym last Wednesday.  He states they come and go and they are transient.  Mostly occur at night.  Offered him a low-dose of beta-blocker.  Patient prefers not to start beta blocker at this time.  He does  admit he has significant issues with anxiety and has panic attacks.  States the palpitations are usually not bothersome and he has no associated symptoms such as orthostasis, presyncopal or syncopal episodes.  Medication Adjustments/Labs and Tests Ordered: Current medicines are reviewed at length with the patient today.  Concerns regarding medicines are outlined above.   Disposition: Follow-up with Dr Diona Browner or APP 1 year.  Signed, Rennis Harding, NP 07/03/2019 12:50 PM    [facility] [address] [place], [place], [place] [address] Phone: [phone]; Fax: [phone]

## 2019-07-03 ENCOUNTER — Other Ambulatory Visit: Payer: Self-pay

## 2019-07-03 ENCOUNTER — Encounter: Payer: Self-pay | Admitting: Family Medicine

## 2019-07-03 ENCOUNTER — Ambulatory Visit (INDEPENDENT_AMBULATORY_CARE_PROVIDER_SITE_OTHER): Payer: Medicare Other | Admitting: Family Medicine

## 2019-07-03 VITALS — BP 120/60 | HR 64 | Ht 71.0 in | Wt 178.0 lb

## 2019-07-03 DIAGNOSIS — I351 Nonrheumatic aortic (valve) insufficiency: Secondary | ICD-10-CM | POA: Diagnosis not present

## 2019-07-03 DIAGNOSIS — I10 Essential (primary) hypertension: Secondary | ICD-10-CM | POA: Diagnosis not present

## 2019-07-03 DIAGNOSIS — R002 Palpitations: Secondary | ICD-10-CM

## 2019-07-03 DIAGNOSIS — I77819 Aortic ectasia, unspecified site: Secondary | ICD-10-CM | POA: Diagnosis not present

## 2019-07-03 NOTE — Patient Instructions (Signed)

## 2019-08-20 ENCOUNTER — Encounter (INDEPENDENT_AMBULATORY_CARE_PROVIDER_SITE_OTHER): Payer: Self-pay | Admitting: Nurse Practitioner

## 2019-08-20 ENCOUNTER — Ambulatory Visit (INDEPENDENT_AMBULATORY_CARE_PROVIDER_SITE_OTHER): Payer: Managed Care, Other (non HMO) | Admitting: Nurse Practitioner

## 2019-08-20 ENCOUNTER — Other Ambulatory Visit: Payer: Self-pay

## 2019-08-20 VITALS — BP 100/80 | HR 66 | Temp 97.5°F | Ht 71.0 in | Wt 180.2 lb

## 2019-08-20 DIAGNOSIS — Z23 Encounter for immunization: Secondary | ICD-10-CM

## 2019-08-20 DIAGNOSIS — Z0000 Encounter for general adult medical examination without abnormal findings: Secondary | ICD-10-CM

## 2019-08-20 NOTE — Patient Instructions (Signed)
  Tim Rogers , Thank you for taking time to come for your Medicare Wellness Visit. I appreciate your ongoing commitment to your health goals. Please review the following plan we discussed and let me know if I can assist you in the future.   These are the goals we discussed: Goals   None     This is a list of the screening recommended for you and due dates:  Health Maintenance  Topic Date Due  . Tetanus Vaccine  Never done  . Flu Shot  09/30/2019  . Pneumonia vaccines (2 of 2 - PPSV23) 12/04/2021  . Colon Cancer Screening  11/15/2023  . COVID-19 Vaccine  Completed  .  Hepatitis C: One time screening is recommended by Center for Disease Control  (CDC) for  adults born from [birth year] through 1966.   Completed  . HIV Screening  Completed

## 2019-08-20 NOTE — Progress Notes (Signed)
Subjective:   Tim Rogers is a 64 y.o. male who presents for Medicare Annual/Subsequent preventive examination.  Review of Systems     Cardiac Risk Factors include: advanced age (>42men, >58 women);dyslipidemia;male gender;hypertension     Objective:    Today's Vitals   08/20/19 0751  BP: 100/80  Pulse: 66  Temp: (!) 97.5 F (36.4 C)  TempSrc: Temporal  SpO2: 97%  Weight: 180 lb 3.2 oz (81.7 kg)  Height: 5\' 11"  (1.803 m)   Body mass index is 25.13 kg/m.  Advanced Directives 08/20/2019 09/21/2017  Does Patient Have a Medical Advance Directive? Yes Yes  Type of Advance Directive Living will Living will  Does patient want to make changes to medical advance directive? No - Patient declined -  Copy of Healthcare Power of Attorney in Chart? - No - copy requested    Current Medications (verified) Outpatient Encounter Medications as of 08/20/2019  Medication Sig  . amLODipine (NORVASC) 10 MG tablet TAKE 1 TABLET(10 MG) BY MOUTH DAILY  . azelastine (ASTELIN) 0.1 % nasal spray Place 2 sprays into both nostrils daily as needed for allergies.   . Cholecalciferol (VITAMIN D-3) 125 MCG (5000 UT) TABS Take 10,000 Units by mouth daily at 12 noon.  . Multiple Vitamins-Minerals (MULTIVITAMIN ADULT PO) Take 1 tablet by mouth daily.   . pantoprazole (PROTONIX) 20 MG tablet Take 1 tablet (20 mg total) by mouth daily.  08/22/2019 UNABLE TO FIND Testosterone implant (Patient not taking: Reported on 08/20/2019)   No facility-administered encounter medications on file as of 08/20/2019.    Allergies (verified) Patient has no known allergies.   History: Past Medical History:  Diagnosis Date  . Anxiety    2003  . Ascending aortic aneurysm (HCC)   . GERD (gastroesophageal reflux disease)   . Hypertension   . Low testosterone   . PVCs (premature ventricular contractions)    Past Surgical History:  Procedure Laterality Date  . CHOLECYSTECTOMY    . ESOPHAGOGASTRODUODENOSCOPY N/A 09/21/2017     Procedure: ESOPHAGOGASTRODUODENOSCOPY (EGD);  Surgeon: 09/23/2017, MD;  [facility];  Service: Endoscopy;  Laterality: N/A;  2:55  . TIF procedure     Family History  Problem Relation Age of Onset  . Alcohol abuse Mother   . Cancer Mother        lung  . COPD Mother   . Heart disease Mother   . Early death Mother        age 29 multiple factors  . Cancer Sister        breast   Social History   Socioeconomic History  . Marital status: Single    Spouse name: Not on file  . Number of children: 0  . Years of education: 60  . Highest education level: Not on file  Occupational History  . Occupation: retired  Tobacco Use  . Smoking status: Never Smoker  . Smokeless tobacco: Current User    Types: Snuff  Vaping Use  . Vaping Use: Never used  Substance and Sexual Activity  . Alcohol use: Yes    Alcohol/week: 5.0 standard drinks    Types: 5 Cans of beer per week    Comment: couple of beer sometimes daily  . Drug use: No  . Sexual activity: Not Currently    Birth control/protection: None  Other Topics Concern  . Not on file  Social History Narrative   Retired.Single.Lives alone.   Ex-[employer].   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 Difficulty of Paying Living Expenses:   Food Insecurity:   . Worried About Programme researcher, broadcasting/film/video in the Last Year:   . Barista in the Last Year:   Transportation Needs:   . Freight forwarder (Medical):   Marland Kitchen Lack of Transportation (Non-Medical):   Physical Activity:   . Days of Exercise per Week:   . Minutes of Exercise per Session:   Stress:   . Feeling of Stress :   Social Connections:   . Frequency of Communication with Friends and Family:   . Frequency of Social Gatherings with Friends and Family:   . Attends Religious Services:   . Active Member of Clubs or  Organizations:   . Attends Banker Meetings:   Marland Kitchen Marital Status:     Tobacco Counseling Ready to quit: Not Answered Counseling given: Not Answered   Clinical Intake:  Pre-visit preparation completed: Yes  Pain : No/denies pain     Nutritional Status: BMI of 19-24  Normal Nutritional Risks: None Diabetes: No  How often do you need to have someone help you when you read instructions, pamphlets, or other written materials from your doctor or pharmacy?: 1 - Never What is the last grade level you completed in school?: 2 masters degree 1/2 PHD  Diabetic? No  Interpreter Needed?: No  Information entered by :: Venia Carbon, NT   Activities of Daily Living In your present state of health, do you have any difficulty performing the following activities: 08/20/2019  Hearing? Y  Comment Disabled secondary to hearing loss while in the [employer]; is being managed by [facility]  Vision? N  Difficulty concentrating or making decisions? N  Walking or climbing stairs? N  Dressing or bathing? N  Doing errands, shopping? N  Preparing Food and eating ? N  Using the Toilet? N  In the past six months, have you accidently leaked urine? N  Do you have problems with loss of bowel control? N  Managing your Medications? N  Managing your Finances? N  Housekeeping or managing your Housekeeping? N  Some recent data might be hidden    Patient Care Team: Wilson Singer, MD as PCP - General (Internal Medicine) Jonelle Sidle, MD as PCP - Cardiology (Cardiology)  Indicate any recent Medical Services you may have received from other than [facility] providers in the past year (date may be approximate).     Assessment:   This is a routine wellness examination for Tim Rogers.  Hearing/Vision screen No exam data present  Dietary issues and exercise activities discussed: Current Exercise Habits: Structured exercise class, Type of exercise: treadmill;strength training/weights, Time  (Minutes): > 60, Frequency (Times/Week): 3, Weekly Exercise (Minutes/Week): 0, Intensity: Moderate, Exercise limited by: None identified  Goals   None    Depression Screen PHQ 2/9 Scores 08/20/2019 05/16/2019 10/01/2016  PHQ - 2 Score 0 0 0  Exception Documentation - Medical reason -    Fall Risk Fall Risk  08/20/2019 05/16/2019 10/01/2016  Falls in the past year? 1 0 No  Number falls in past yr: 0 0 -  Injury with Fall? 0 0 -  Risk for fall due to : History of fall(s) No Fall Risks -  Follow up Education provided;Falls prevention discussed Falls evaluation completed -    Any stairs  in or around the home? Yes  If so, are there any without handrails? Yes  Home free of loose throw rugs in walkways, pet beds, electrical cords, etc? No  Adequate lighting in your home to reduce risk of falls? Yes   ASSISTIVE DEVICES UTILIZED TO PREVENT FALLS:  Life alert? No  Use of a cane, walker or w/c? No  Grab bars in the bathroom? No  Shower chair or bench in shower? No  Elevated toilet seat or a handicapped toilet? No   TIMED UP AND GO:  Was the test performed? Yes .  Length of time to ambulate 10 feet: 5 sec.   Gait steady and fast without use of assistive device  Cognitive Function:        6CIT Screen 08/20/2019  What Year? 0 points  What month? 0 points  What time? 0 points  Count back from 20 0 points  Months in reverse 0 points  Repeat phrase 2 points  Total Score 2    Immunizations Immunization History  Administered Date(s) Administered  . Influenza Inj Mdck Quad Pf 12/12/2017  . Influenza,inj,Quad PF,6+ Mos 12/04/2016, 10/07/2018  . Moderna SARS-COVID-2 Vaccination 04/05/2019, 05/04/2019  . Pneumococcal Conjugate-13 11/19/2014  . Pneumococcal Polysaccharide-23 12/04/2016  . Zoster Recombinat (Shingrix) 12/04/2016    TDAP status: Patient wishes to receive this vaccine today after disclosing that insurance does not cover the vaccine as a preventative vaccine.  Flu  Vaccine status: Up to date Pneumococcal vaccine status: Up to date Covid-19 vaccine status: Information provided on how to obtain vaccines.   Qualifies for Shingles Vaccine? No   Zostavax completed No   Shingrix Completed?: Yes  Screening Tests Health Maintenance  Topic Date Due  . TETANUS/TDAP  Never done  . INFLUENZA VACCINE  09/30/2019  . PNA vac Low Risk Adult (2 of 2 - PPSV23) 12/04/2021  . COLONOSCOPY  11/15/2023  . COVID-19 Vaccine  Completed  . Hepatitis C Screening  Completed  . HIV Screening  Completed    Health Maintenance  Health Maintenance Due  Topic Date Due  . TETANUS/TDAP  Never done    Colorectal cancer screening: Completed 2015. Repeat every 10 years  Lung Cancer Screening: (Low Dose CT Chest recommended if Age 65-80 years, 30 pack-year currently smoking OR have quit w/in 15years.) does not qualify.   Lung Cancer Screening Referral: NO  Additional Screening:  Hepatitis C Screening: does not qualify; Completed 2020   Vision Screening: Recommended annual ophthalmology exams for early detection of glaucoma and other disorders of the eye. Is the patient up to date with their annual eye exam?  Yes  Who is the provider or what is the name of the office in which the patient attends annual eye exams? [facility] If pt is not established with a provider, would they like to be referred to a provider to establish care? No .   Dental Screening: Recommended annual dental exams for proper oral hygiene. Sees Dr. Tenny Craw three times a year  Community Resource Referral / Chronic Care Management: CRR required this visit?  No   CCM required this visit?  No      Plan:   Patient has chose to get tetanus shot administered today.  Otherwise he is up-to-date with all other recommended vaccinations.  May be due for PCV 13 this September.  He will follow-up for recommended health care screenings as they come due.  I have personally reviewed and noted the following in the  patient's chart:   .  Medical and social history . Use of alcohol, tobacco or illicit drugs  . Current medications and supplements . Functional ability and status . Nutritional status . Physical activity . Advanced directives . List of other physicians . Hospitalizations, surgeries, and ER visits in previous 12 months . Vitals . Screenings to include cognitive, depression, and falls . Referrals and appointments  In addition, I have reviewed and discussed with patient certain preventive protocols, quality metrics, and best practice recommendations. A written personalized care plan for preventive services as well as general preventive health recommendations were provided to patient.    He will follow-up as scheduled in September, and he will be scheduled for his next annual Medicare wellness exam in approximately 1 year.  Ailene Ards, NP   08/20/2019

## 2019-09-10 ENCOUNTER — Other Ambulatory Visit (INDEPENDENT_AMBULATORY_CARE_PROVIDER_SITE_OTHER): Payer: Self-pay | Admitting: Gastroenterology

## 2019-09-10 DIAGNOSIS — K219 Gastro-esophageal reflux disease without esophagitis: Secondary | ICD-10-CM

## 2019-09-10 MED ORDER — PANTOPRAZOLE SODIUM 20 MG PO TBEC: 20.0000 mg | DELAYED_RELEASE_TABLET | Freq: Every day | ORAL | 1 refills | Status: DC

## 2019-10-14 ENCOUNTER — Other Ambulatory Visit (INDEPENDENT_AMBULATORY_CARE_PROVIDER_SITE_OTHER): Payer: Self-pay | Admitting: Gastroenterology

## 2019-10-14 DIAGNOSIS — K219 Gastro-esophageal reflux disease without esophagitis: Secondary | ICD-10-CM

## 2019-10-15 NOTE — Telephone Encounter (Signed)
Will refill medication for 2 months, needs follow up appointment with any provider for further refills (as discussed in previous phone encounters).  Thanks,  Katrinka Blazing, MD Gastroenterology and Hepatology [facility]

## 2019-11-12 ENCOUNTER — Other Ambulatory Visit: Payer: Self-pay

## 2019-11-12 ENCOUNTER — Ambulatory Visit (INDEPENDENT_AMBULATORY_CARE_PROVIDER_SITE_OTHER): Payer: Managed Care, Other (non HMO) | Admitting: Gastroenterology

## 2019-11-12 ENCOUNTER — Encounter (INDEPENDENT_AMBULATORY_CARE_PROVIDER_SITE_OTHER): Payer: Self-pay | Admitting: Gastroenterology

## 2019-11-12 VITALS — BP 137/75 | HR 83 | Temp 98.1°F | Ht 71.0 in | Wt 183.3 lb

## 2019-11-12 DIAGNOSIS — R1013 Epigastric pain: Secondary | ICD-10-CM | POA: Diagnosis not present

## 2019-11-12 DIAGNOSIS — K219 Gastro-esophageal reflux disease without esophagitis: Secondary | ICD-10-CM

## 2019-11-12 MED ORDER — PANTOPRAZOLE SODIUM 40 MG PO TBEC
40.0000 mg | DELAYED_RELEASE_TABLET | Freq: Every day | ORAL | 3 refills | Status: DC
Start: 2019-11-12 — End: 2020-10-29

## 2019-11-12 NOTE — Progress Notes (Signed)
Patient profile: Tim Rogers is a 64 y.o. male seen for follow up of GERD. Last seen 08/2017 for EGD.  History of Present Illness: Tim Rogers is seen today for follow-up.  He reports he has been on Protonix 20 mg, previously he was on 40 mg but decreased.  Over the past month he has had symptoms of gnawing and spasms in his epigastric area with a churning sensation.  This most frequently occurs before he eats and eating improves his symptoms.  Is currently occurring about 3-4 times a week.  He denies any nausea vomiting with the symptoms.  Triggers include certain types of peppers, Margaritas, etc.  Gaviscon helps the symptoms.  He denies any NSAID intake frequently.  Very occasional alcohol but none in the past 2 weeks since epigastric pain became more frequent.  Non-smoker.   Patient denies any constipation, diarrhea, rectal bleeding, lower abdominal pain.   He stays active and exercises regularly  Wt Readings from Last 3 Encounters:  11/12/19 183 lb 4.8 oz (83.1 kg)  08/20/19 180 lb 3.2 oz (81.7 kg)  07/03/19 178 lb (80.7 kg)     Last Colonoscopy: 2014- Dr Wilma Flavin - 2 sessile polyps descending and sigmoid.  Small internal and external hemorrhoids.  Pathology hyperplastic but recommends 5-year repeat.   Last Endoscopy: 2019-- Normal esophagus. - Z-line irregular, 39 cm from the incisors. - 2 cm hiatal hernia. - Suture at fundus rfom previous surgery. - Normal duodenal bulb and second portion of the duodenum. - No specimens collected.   Past Medical History:  Past Medical History:  Diagnosis Date  . Anxiety    2003  . Ascending aortic aneurysm (HCC)   . GERD (gastroesophageal reflux disease)   . Hypertension   . Low testosterone   . PVCs (premature ventricular contractions)     Problem List: Patient Active Problem List   Diagnosis Date Noted  . Gastroesophageal reflux disease without esophagitis 08/15/2017  . Positive PPD 10/08/2016  . Polycythemia  10/08/2016  . Hypogonadism in male 10/01/2016  . Environmental allergies 10/01/2016  . Chronic GERD 10/01/2016  . AAA (abdominal aortic aneurysm) without rupture (HCC) 10/01/2016  . Recurrent herpes simplex virus (HSV) infection of buttock 10/01/2016    Past Surgical History: Past Surgical History:  Procedure Laterality Date  . CHOLECYSTECTOMY    . ESOPHAGOGASTRODUODENOSCOPY N/A 09/21/2017   Procedure: ESOPHAGOGASTRODUODENOSCOPY (EGD);  Surgeon: Malissa Hippo, MD;  [facility];  Service: Endoscopy;  Laterality: N/A;  2:55  . TIF procedure      Allergies: No Known Allergies    Home Medications:  Current Outpatient Medications:  .  amLODipine (NORVASC) 10 MG tablet, TAKE 1 TABLET(10 MG) BY MOUTH DAILY, Disp: 30 tablet, Rfl: 11 .  azelastine (ASTELIN) 0.1 % nasal spray, Place 2 sprays into both nostrils daily as needed for allergies. , Disp: , Rfl:  .  Cholecalciferol (VITAMIN D-3) 125 MCG (5000 UT) TABS, Take 10,000 Units by mouth daily at 12 noon., Disp: , Rfl:  .  Multiple Vitamins-Minerals (MULTIVITAMIN ADULT PO), Take 1 tablet by mouth daily. , Disp: , Rfl:  .  pantoprazole (PROTONIX) 40 MG tablet, Take 1 tablet (40 mg total) by mouth daily. Take 30 min before meals, Disp: 90 tablet, Rfl: 3 .  UNABLE TO FIND, Testosterone implant (Patient not taking: Reported on 08/20/2019), Disp: , Rfl:    Family History: family history includes Alcohol abuse in his mother; COPD in his mother; Cancer in his mother and  sister; Early death in his mother; Heart disease in his mother.    Social History:   reports that he has never smoked. His smokeless tobacco use includes snuff. He reports current alcohol use of about 5.0 standard drinks of alcohol per week. He reports that he does not use drugs.   Review of Systems: Constitutional: Denies weight loss/weight gain  Eyes: No changes in vision. ENT: No oral lesions, sore throat.  GI: see HPI.  Heme/Lymph: No easy bruising.  CV: No  chest pain.  GU: No hematuria.  Integumentary: No rashes.  Neuro: No headaches.  Psych: No depression/anxiety.  Endocrine: No heat/cold intolerance.  Allergic/Immunologic: No urticaria.  Resp: No cough, SOB.  Musculoskeletal: No joint swelling.    Physical Examination: BP 137/75 (BP Location: Right Arm, Patient Position: Sitting, Cuff Size: Normal)   Pulse 83   Temp 98.1 F (36.7 C) (Oral)   Ht 5\' 11"  (1.803 m)   Wt 183 lb 4.8 oz (83.1 kg)   BMI 25.57 kg/m  Gen: NAD, alert and oriented x 4 HEENT: PEERLA, EOMI, Neck: supple, no JVD Chest: CTA bilaterally, no wheezes, crackles, or other adventitious sounds CV: RRR, no m/g/c/r Abd: soft, NT, ND, +BS in all four quadrants; no HSM, guarding, ridigity, or rebound tenderness Ext: no edema, well perfused with 2+ pulses, Skin: no rash or lesions noted on observed skin Lymph: no noted LAD  Data Reviewed:  We will request labs from PCP   Assessment/Plan: Mr. Matney is a 64 y.o. male    Issam was seen today for gastroesophageal reflux.  Diagnoses and all orders for this visit:  Chronic GERD  Dyspepsia  Other orders -     pantoprazole (PROTONIX) 40 MG tablet; Take 1 tablet (40 mg total) by mouth daily. Take 30 min before meals   1.  GERD/dyspepsia-symptoms worsen when he decreased from 40 mg of Protonix to 20 mg a day.  Recent exacerbation of symptoms may reflect underlying gastritis.  We discussed avoiding triggers such as alcohol.  We will increase his Protonix back to 40 mg a day.  We reviewed if this does not resolve his symptoms he will need an upper endoscopy.  He will contact me if symptoms do not improve  2.  Colon cancer screening-up-to-date, routinely due 2024.  No lower GI symptoms.  F/up 1 year if doing well. Sooner if epigastric discomfort continues. He is in agreement w/ plan.   25 min face to face time   I personally performed the service, non-incident to. (WP)  2025, [facility]  for Gastro[facility] Disease

## 2019-11-12 NOTE — Patient Instructions (Signed)
We are increasing protonix to 40mg  once a day before breakfast. Please call me if this does not improve symptoms.

## 2019-11-21 ENCOUNTER — Other Ambulatory Visit: Payer: Self-pay

## 2019-11-21 ENCOUNTER — Ambulatory Visit (INDEPENDENT_AMBULATORY_CARE_PROVIDER_SITE_OTHER): Payer: Managed Care, Other (non HMO) | Admitting: Internal Medicine

## 2019-11-21 ENCOUNTER — Encounter (INDEPENDENT_AMBULATORY_CARE_PROVIDER_SITE_OTHER): Payer: Self-pay | Admitting: Internal Medicine

## 2019-11-21 VITALS — BP 123/70 | HR 87 | Temp 97.2°F | Resp 18 | Ht 71.0 in | Wt 184.0 lb

## 2019-11-21 DIAGNOSIS — E559 Vitamin D deficiency, unspecified: Secondary | ICD-10-CM | POA: Diagnosis not present

## 2019-11-21 DIAGNOSIS — E291 Testicular hypofunction: Secondary | ICD-10-CM

## 2019-11-21 DIAGNOSIS — I10 Essential (primary) hypertension: Secondary | ICD-10-CM | POA: Diagnosis not present

## 2019-11-21 MED ORDER — TESTOSTERONE 20 % CREA
100.0000 mg | TOPICAL_CREAM | Freq: Two times a day (BID) | 0 refills | Status: DC
Start: 2019-11-21 — End: 2020-06-10

## 2019-11-21 NOTE — Progress Notes (Signed)
Metrics: Intervention Frequency ACO  Documented Smoking Status Yearly  Screened one or more times in 24 months  Cessation Counseling or  Active cessation medication Past 24 months  Past 24 months   Guideline developer: UpToDate (See UpToDate for funding source) Date Released: 2014       Wellness Office Visit  Subjective:  Patient ID: Tim Rogers, male    DOB: 1955-09-16  Age: 64 y.o. MRN: [id number]  CC: This man comes in for follow-up of hypertension, hypogonadism. HPI  He says he feels great.  He has not been on testosterone pellets for over 2 years now. He has trouble with insomnia which he did when he was on testosterone also.  He has tried over-the-counter melatonin from [employer]. He continues with amlodipine for hypertension and he tolerates this well. He exercises on a regular basis.  He denies any chest pain, dyspnea or palpitations. He continues with vitamin D3 10,000 units daily for vitamin D deficiency. Past Medical History:  Diagnosis Date  . Anxiety    2003  . Ascending aortic aneurysm (HCC)   . GERD (gastroesophageal reflux disease)   . Hypertension   . Low testosterone   . PVCs (premature ventricular contractions)    Past Surgical History:  Procedure Laterality Date  . CHOLECYSTECTOMY    . ESOPHAGOGASTRODUODENOSCOPY N/A 09/21/2017   Procedure: ESOPHAGOGASTRODUODENOSCOPY (EGD);  Surgeon: Malissa Hippo, MD;  [facility];  Service: Endoscopy;  Laterality: N/A;  2:55  . TIF procedure       Family History  Problem Relation Age of Onset  . Alcohol abuse Mother   . Cancer Mother        lung  . COPD Mother   . Heart disease Mother   . Early death Mother        age 52 multiple factors  . Cancer Sister        breast    Social History   Social History Narrative   Retired.Single.Lives alone.   Ex-[employer].   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 Smoking status: Never Smoker  . Smokeless tobacco: Current User    Types: Snuff  Substance Use Topics  . Alcohol use: Yes    Alcohol/week: 5.0 standard drinks    Types: 5 Cans of beer per week    Comment: couple of beer sometimes daily    Current Meds  Medication Sig  . amLODipine (NORVASC) 10 MG tablet TAKE 1 TABLET(10 MG) BY MOUTH DAILY  . azelastine (ASTELIN) 0.1 % nasal spray Place 2 sprays into both nostrils daily as needed for allergies.   . Cholecalciferol (VITAMIN D-3) 125 MCG (5000 UT) TABS Take 10,000 Units by mouth daily at 12 noon.  . Multiple Vitamins-Minerals (MULTIVITAMIN ADULT PO) Take 1 tablet by mouth daily.   . pantoprazole (PROTONIX) 40 MG tablet Take 1 tablet (40 mg total) by mouth daily. Take 30 min before meals  . [DISCONTINUED] UNABLE TO FIND Testosterone implant       Depression screen [facility] 2/9 08/20/2019 05/16/2019 10/01/2016  Decreased Interest 0 0 0  Down, Depressed, Hopeless 0 0 0  PHQ - 2 Score 0 0 0     Objective:   Today's Vitals: BP 123/70 (BP Location: Left Arm, Patient Position: Sitting, Cuff Size: Normal)   Pulse 87   Temp (!) 97.2  F (36.2 C) (Temporal)   Resp 18   Ht 5\' 11"  (1.803 m)   Wt 184 lb (83.5 kg) Comment: 179 at home.  SpO2 98%   BMI 25.66 kg/m  Vitals with BMI 11/21/2019 11/12/2019 08/20/2019  Height 5\' 11"  5\' 11"  5\' 11"   Weight 184 lbs 183 lbs 5 oz 180 lbs 3 oz  BMI 25.67 25.58 25.14  Systolic 123 137 08/22/2019  Diastolic 70 75 80  Pulse 87 83 66     Physical Exam He looks systemically well.  Blood pressure is in a very good range.  He is alert and orientated without any focal neurological signs.      Assessment   1. Hypogonadism in male   2. Essential hypertension   3. Vitamin D deficiency disease       Tests ordered Orders Placed This Encounter  Procedures  . Testosterone Total,Free,Bio, Males     Plan: 1. We will check testosterone levels today but he would like to  actually proceed with testosterone cream after we had a full discussion regarding this.  I have called this prescription into . 2. He will continue with amlodipine for his hypertension which is well controlled. 3. He will continue with vitamin D3 10,000 units daily for vitamin D deficiency. 4. Further recommendations will follow and I will see him for follow-up in 6 months.   Meds ordered this encounter  Medications  . Testosterone 20 % CREA    Sig: Apply 100 mg topically 2 (two) times daily.    Dispense:  100 g    Refill:  0    Mio Schellinger , MD

## 2019-11-22 LAB — TESTOSTERONE TOTAL,FREE,BIO, MALES
Albumin: 4.4 g/dL (ref 3.6–5.1)
Sex Hormone Binding: 83 nmol/L — ABNORMAL HIGH (ref 22–77)
Testosterone, Bioavailable: 92.9 ng/dL — ABNORMAL LOW (ref >=110.0)
Testosterone, Free: 46.1 pg/mL (ref 46.0–224.0)
Testosterone: 753 ng/dL (ref 250–827)

## 2019-12-15 ENCOUNTER — Other Ambulatory Visit: Payer: Self-pay | Admitting: Cardiology

## 2020-01-28 ENCOUNTER — Encounter (INDEPENDENT_AMBULATORY_CARE_PROVIDER_SITE_OTHER): Payer: Self-pay | Admitting: Internal Medicine

## 2020-01-28 ENCOUNTER — Telehealth (INDEPENDENT_AMBULATORY_CARE_PROVIDER_SITE_OTHER): Payer: Medicare Other | Admitting: Internal Medicine

## 2020-01-28 VITALS — BP 123/81

## 2020-01-28 DIAGNOSIS — B370 Candidal stomatitis: Secondary | ICD-10-CM | POA: Diagnosis not present

## 2020-01-28 MED ORDER — NYSTATIN 100000 UNIT/ML MT SUSP: 5.0000 mL | Freq: Four times a day (QID) | OROMUCOSAL | 0 refills | Status: DC

## 2020-01-28 NOTE — Progress Notes (Signed)
Metrics: Intervention Frequency ACO  Documented Smoking Status Yearly  Screened one or more times in 24 months  Cessation Counseling or  Active cessation medication Past 24 months  Past 24 months   Guideline developer: UpToDate (See UpToDate for funding source) Date Released: 2014       Wellness Office Visit  Subjective:  Patient ID: GAREN WOOLBRIGHT, male    DOB: June 23, 1955  Age: 64 y.o. MRN: [id number]  CC: This is an audiovisual telemedicine visit with the permission of the patient who is at home.  I am in my office.  I was able to easily identify him on video. Tongue/throat discoloration HPI  The patient describes a 2-week history of the above with whitish areas on the tongue and also at the back of his throat and tonsil area.  He denies any fever, sore throat, feeling unwell in any way.  He denies any myalgia.  He has no other respiratory symptoms apart from his usual postnasal drainage. Past Medical History:  Diagnosis Date  . Anxiety    2003  . Ascending aortic aneurysm (HCC)   . GERD (gastroesophageal reflux disease)   . Hypertension   . Low testosterone   . PVCs (premature ventricular contractions)    Past Surgical History:  Procedure Laterality Date  . CHOLECYSTECTOMY    . ESOPHAGOGASTRODUODENOSCOPY N/A 09/21/2017   Procedure: ESOPHAGOGASTRODUODENOSCOPY (EGD);  Surgeon: Malissa Hippo, MD;  [facility];  Service: Endoscopy;  Laterality: N/A;  2:55  . TIF procedure       Family History  Problem Relation Age of Onset  . Alcohol abuse Mother   . Cancer Mother        lung  . COPD Mother   . Heart disease Mother   . Early death Mother        age 52 multiple factors  . Cancer Sister        breast    Social History   Social History Narrative   Retired.Single.Lives alone.   Ex-[employer].   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  Smoking status: Never Smoker  . Smokeless tobacco: Current User    Types: Snuff  Substance Use Topics  . Alcohol use: Yes    Alcohol/week: 5.0 standard drinks    Types: 5 Cans of beer per week    Comment: couple of beer sometimes daily    Current Meds  Medication Sig  . amLODipine (NORVASC) 10 MG tablet TAKE 1 TABLET(10 MG) BY MOUTH DAILY  . azelastine (ASTELIN) 0.1 % nasal spray Place 2 sprays into both nostrils daily as needed for allergies.   . Cholecalciferol (VITAMIN D-3) 125 MCG (5000 UT) TABS Take 10,000 Units by mouth daily at 12 noon.  . Multiple Vitamins-Minerals (MULTIVITAMIN ADULT PO) Take 1 tablet by mouth daily.   Marland Kitchen nystatin (MYCOSTATIN) 100000 UNIT/ML suspension Take 5 mLs (500,000 Units total) by mouth 4 (four) times daily.  . pantoprazole (PROTONIX) 40 MG tablet Take 1 tablet (40 mg total) by mouth daily. Take 30 min before meals  . Testosterone 20 % CREA Apply 100 mg topically 2 (two) times daily.      Depression screen [facility] 2/9 08/20/2019 05/16/2019 10/01/2016  Decreased Interest 0 0 0  Down, Depressed, Hopeless 0 0 0  PHQ - 2 Score 0 0 0  Objective:   Today's Vitals: BP 123/81  Vitals with BMI 01/28/2020 11/21/2019 11/12/2019  Height (No Data) 5\' 11"  5\' 11"   Weight (No Data) 184 lbs 183 lbs 5 oz  BMI - 25.67 25.58  Systolic 123 123  Diastolic 81 70 75  Pulse - 87 83     Physical Exam  I was able to see the whitish areas on his tongue but video would not allow a good look at the back of his throat and tonsil area.  The whitish areas on his tongue could possibly be fungal in origin representing thrush.     Assessment   1. Thrush, oral       Tests ordered No orders of the defined types were placed in this encounter.    Plan: 1. I will treat him empirically with nystatin suspension 4 times a day, swish and swallow. 2. I have told him that if he does not improve in about a week or so, he should let me know and I will refer him to ENT for  further and detailed evaluation.  I told him that I do not think his symptoms represent COVID-19 disease at all. 3. This phone call/video lasted 20 minutes.   Meds ordered this encounter  Medications  . nystatin (MYCOSTATIN) 100000 UNIT/ML suspension    Sig: Take 5 mLs (500,000 Units total) by mouth 4 (four) times daily.    Dispense:  60 mL    Refill:  0    Maddalyn Lutze , MD

## 2020-01-30 ENCOUNTER — Telehealth (INDEPENDENT_AMBULATORY_CARE_PROVIDER_SITE_OTHER): Payer: Self-pay

## 2020-01-30 ENCOUNTER — Other Ambulatory Visit (INDEPENDENT_AMBULATORY_CARE_PROVIDER_SITE_OTHER): Payer: Self-pay | Admitting: Internal Medicine

## 2020-01-30 MED ORDER — NYSTATIN 100000 UNIT/ML MT SUSP: 5.0000 mL | Freq: Four times a day (QID) | OROMUCOSAL | 0 refills | Status: DC

## 2020-01-30 NOTE — Telephone Encounter (Signed)
Okay, I have sent that now.

## 2020-04-22 ENCOUNTER — Telehealth (INDEPENDENT_AMBULATORY_CARE_PROVIDER_SITE_OTHER): Payer: Self-pay

## 2020-04-22 NOTE — Telephone Encounter (Signed)
Pt called today very mad and upset about a bill from [facility] for blood work from here on [facility]. Pt said he  "I am retired and I am not paying a this high ass bill." What did we bill him for? I stated to Tim Rogers that we do not charge for labs. [facility] is the person who bills for services. [facility] goes over with you on time of services and ABN to sign during to bill. If he did not give them his updated insurance cards it may be a mistake in billing. Ask if he would call [facility] toll free number and discuss with a tech .

## 2020-05-12 ENCOUNTER — Telehealth (INDEPENDENT_AMBULATORY_CARE_PROVIDER_SITE_OTHER): Payer: Self-pay

## 2020-05-13 ENCOUNTER — Other Ambulatory Visit (INDEPENDENT_AMBULATORY_CARE_PROVIDER_SITE_OTHER): Payer: Self-pay | Admitting: Internal Medicine

## 2020-05-13 ENCOUNTER — Other Ambulatory Visit: Payer: Self-pay

## 2020-05-13 ENCOUNTER — Other Ambulatory Visit (INDEPENDENT_AMBULATORY_CARE_PROVIDER_SITE_OTHER): Payer: Medicare Other

## 2020-05-13 DIAGNOSIS — E291 Testicular hypofunction: Secondary | ICD-10-CM

## 2020-05-13 DIAGNOSIS — Z125 Encounter for screening for malignant neoplasm of prostate: Secondary | ICD-10-CM

## 2020-05-13 DIAGNOSIS — E559 Vitamin D deficiency, unspecified: Secondary | ICD-10-CM

## 2020-05-13 DIAGNOSIS — I10 Essential (primary) hypertension: Secondary | ICD-10-CM

## 2020-05-14 LAB — COMPLETE METABOLIC PANEL WITH GFR
AG Ratio: 1.7 (calc) (ref 1.0–2.5)
ALT: 31 U/L (ref 9–46)
AST: 32 U/L (ref 10–35)
Albumin: 4.5 g/dL (ref 3.6–5.1)
Alkaline phosphatase (APISO): 82 U/L (ref 35–144)
BUN: 19 mg/dL (ref 7–25)
CO2: 28 mmol/L (ref 20–32)
Calcium: 9.5 mg/dL (ref 8.6–10.3)
Chloride: 101 mmol/L (ref 98–110)
Creat: 1.09 mg/dL (ref 0.70–1.25)
GFR, Est African American: 82 mL/min/{1.73_m2} (ref >=60)
GFR, Est Non African American: 70 mL/min/{1.73_m2} (ref >=60)
Globulin: 2.6 g/dL (calc) (ref 1.9–3.7)
Glucose, Bld: 88 mg/dL (ref 65–99)
Potassium: 4.5 mmol/L (ref 3.5–5.3)
Sodium: 138 mmol/L (ref 135–146)
Total Bilirubin: 1.8 mg/dL — ABNORMAL HIGH (ref 0.2–1.2)
Total Protein: 7.1 g/dL (ref 6.1–8.1)

## 2020-05-14 LAB — PSA, TOTAL WITH REFLEX TO PSA, FREE: PSA, Total: 1.1 ng/mL (ref <=4.0)

## 2020-05-14 LAB — TESTOSTERONE TOTAL,FREE,BIO, MALES
Albumin: 4.5 g/dL (ref 3.6–5.1)
Sex Hormone Binding: 88 nmol/L — ABNORMAL HIGH (ref 22–77)
Testosterone, Bioavailable: 137.4 ng/dL (ref >=110.0)
Testosterone, Free: 66.8 pg/mL (ref 46.0–224.0)
Testosterone: 1092 ng/dL — ABNORMAL HIGH (ref 250–827)

## 2020-05-14 LAB — VITAMIN D 25 HYDROXY (VIT D DEFICIENCY, FRACTURES): Vit D, 25-Hydroxy: 70 ng/mL (ref 30–100)

## 2020-05-20 ENCOUNTER — Encounter (INDEPENDENT_AMBULATORY_CARE_PROVIDER_SITE_OTHER): Payer: Self-pay | Admitting: Internal Medicine

## 2020-05-20 ENCOUNTER — Other Ambulatory Visit: Payer: Self-pay

## 2020-05-20 ENCOUNTER — Ambulatory Visit (INDEPENDENT_AMBULATORY_CARE_PROVIDER_SITE_OTHER): Payer: Medicare Other | Admitting: Internal Medicine

## 2020-05-20 VITALS — BP 138/80 | HR 70 | Temp 97.7°F | Ht 71.0 in | Wt 188.6 lb

## 2020-05-20 DIAGNOSIS — E559 Vitamin D deficiency, unspecified: Secondary | ICD-10-CM

## 2020-05-20 DIAGNOSIS — E291 Testicular hypofunction: Secondary | ICD-10-CM

## 2020-05-20 DIAGNOSIS — I10 Essential (primary) hypertension: Secondary | ICD-10-CM | POA: Diagnosis not present

## 2020-05-20 DIAGNOSIS — J309 Allergic rhinitis, unspecified: Secondary | ICD-10-CM | POA: Diagnosis not present

## 2020-05-20 DIAGNOSIS — K219 Gastro-esophageal reflux disease without esophagitis: Secondary | ICD-10-CM

## 2020-05-20 MED ORDER — AZELASTINE HCL 0.1 % NA SOLN: 2.0000 | Freq: Every day | NASAL | 3 refills | Status: DC | PRN

## 2020-05-20 NOTE — Progress Notes (Signed)
Metrics: Intervention Frequency ACO  Documented Smoking Status Yearly  Screened one or more times in 24 months  Cessation Counseling or  Active cessation medication Past 24 months  Past 24 months   Guideline developer: UpToDate (See UpToDate for funding source) Date Released: 2014       Wellness Office Visit  Subjective:  Patient ID: Tim Rogers, male    DOB: 14-May-1955  Age: 65 y.o. MRN: [id number]  CC: This man comes in for follow-up of hypertension, gastroesophageal reflux disease, testosterone therapy. HPI  He is doing very well.  Since being back on testosterone cream, he feels better and blood work that was done about a week ago shows reasonably good levels. He continues on amlodipine for hypertension and also continues on Protonix for gastroesophageal reflux disease. He also has allergic sinus problems with nasal congestion and some bleeding.  He takes a combination of Astelin nasal spray and Flonase nasal spray for this. Past Medical History:  Diagnosis Date  . Anxiety    2003  . Ascending aortic aneurysm (HCC)   . GERD (gastroesophageal reflux disease)   . Hypertension   . Low testosterone   . PVCs (premature ventricular contractions)    Past Surgical History:  Procedure Laterality Date  . CHOLECYSTECTOMY    . ESOPHAGOGASTRODUODENOSCOPY N/A 09/21/2017   Procedure: ESOPHAGOGASTRODUODENOSCOPY (EGD);  Surgeon: Malissa Hippo, MD;  [facility];  Service: Endoscopy;  Laterality: N/A;  2:55  . TIF procedure       Family History  Problem Relation Age of Onset  . Alcohol abuse Mother   . Cancer Mother        lung  . COPD Mother   . Heart disease Mother   . Early death Mother        age 44 multiple factors  . Cancer Sister        breast    Social History   Social History Narrative   Retired.Single.Lives alone.   Ex-[employer].   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 Smoking status: Never Smoker  . Smokeless tobacco: Former Neurosurgeon    Types: Snuff  Substance Use Topics  . Alcohol use: Yes    Alcohol/week: 5.0 standard drinks    Types: 5 Cans of beer per week    Comment: couple of beer sometimes daily    Current Meds  Medication Sig  . amLODipine (NORVASC) 10 MG tablet TAKE 1 TABLET(10 MG) BY MOUTH DAILY  . Cholecalciferol (VITAMIN D-3) 125 MCG (5000 UT) TABS Take 10,000 Units by mouth daily at 12 noon.  . Multiple Vitamins-Minerals (MULTIVITAMIN ADULT PO) Take 1 tablet by mouth daily.   . pantoprazole (PROTONIX) 40 MG tablet Take 1 tablet (40 mg total) by mouth daily. Take 30 min before meals  . Testosterone 20 % CREA Apply 100 mg topically 2 (two) times daily.  . [DISCONTINUED] azelastine (ASTELIN) 0.1 % nasal spray Place 2 sprays into both nostrils daily as needed for allergies.   . [DISCONTINUED] nystatin (MYCOSTATIN) 100000 UNIT/ML suspension Take 5 mLs (500,000 Units total) by mouth 4 (four) times daily.       Objective:   Today's Vitals: BP 138/80   Pulse 70   Temp 97.7 F (36.5 C) (Temporal)   Ht 5\' 11"  (1.803 m)   Wt 188 lb 9.6 oz (85.5  kg)   SpO2 98%   BMI 26.30 kg/m  Vitals with BMI 05/20/2020 01/28/2020 11/21/2019  Height 5\' 11"  (No Data) 5\' 11"   Weight 188 lbs 10 oz (No Data) 184 lbs  BMI 26.32 - 25.67  Systolic 138 123  Diastolic 80 81 70  Pulse 70 - 87     Physical Exam  He looks systemically well.  Blood pressure acceptable.  Alert and orientated without any focal neurologic signs.     Assessment   1. Hypogonadism in male   2. Essential hypertension   3. Vitamin D deficiency disease   4. Allergic sinusitis   5. Gastroesophageal reflux disease without esophagitis       Tests ordered No orders of the defined types were placed in this encounter.    Plan: 1. Continue with amlodipine for hypertension. 2. Continue with testosterone therapy as  before. 3. Continue with Protonix for gastroesophageal reflux disease. 4. I will follow him up in November and 045 will see him for an annual Medicare wellness visit in 3 months time.   Meds ordered this encounter  Medications  . azelastine (ASTELIN) 0.1 % nasal spray    Sig: Place 2 sprays into both nostrils daily as needed for allergies.    Dispense:  30 mL    Refill:  3    Kristia Jupiter December, MD

## 2020-05-29 ENCOUNTER — Other Ambulatory Visit (INDEPENDENT_AMBULATORY_CARE_PROVIDER_SITE_OTHER): Payer: Self-pay | Admitting: Internal Medicine

## 2020-05-29 ENCOUNTER — Telehealth (INDEPENDENT_AMBULATORY_CARE_PROVIDER_SITE_OTHER): Payer: Self-pay

## 2020-05-29 DIAGNOSIS — J309 Allergic rhinitis, unspecified: Secondary | ICD-10-CM

## 2020-05-29 NOTE — Telephone Encounter (Signed)
Called patient and gave him the message. Patient verbalized an understanding and was transferred to [facility] to answer more questions.

## 2020-05-29 NOTE — Telephone Encounter (Signed)
Let him know that I will ask Nellie to refer him to Dr. Suszanne Conners, ENT who is in [place].  I am not sure what the addresses unfortunately.

## 2020-05-29 NOTE — Telephone Encounter (Signed)
Patient called and stated that he would like to know which ENT you could send him to? He thought that you mentioned someone on [address] in [place] when you told him you could refer him to someone. Patient stated that he is not getting much better and needs to see an ENT as the prescriptions you prescribed are not clearing him up.  Please advise so I can call him back to tell him.

## 2020-06-10 ENCOUNTER — Telehealth (INDEPENDENT_AMBULATORY_CARE_PROVIDER_SITE_OTHER): Payer: Self-pay

## 2020-06-10 ENCOUNTER — Other Ambulatory Visit (INDEPENDENT_AMBULATORY_CARE_PROVIDER_SITE_OTHER): Payer: Self-pay | Admitting: Internal Medicine

## 2020-06-10 MED ORDER — TESTOSTERONE 20 % CREA: 100.0000 mg | TOPICAL_CREAM | Freq: Two times a day (BID) | 2 refills | Status: DC

## 2020-06-10 NOTE — Telephone Encounter (Signed)
Received a fax from [facility] for a refill request of the following medication:  Testosterone 20 % CREA  Last filled 11/21/2019, 100g with 0 refills  Last OV 05/20/2020  Next OV 08/21/2020

## 2020-06-19 ENCOUNTER — Other Ambulatory Visit (INDEPENDENT_AMBULATORY_CARE_PROVIDER_SITE_OTHER): Payer: Self-pay | Admitting: Internal Medicine

## 2020-06-19 MED ORDER — TESTOSTERONE 20 % CREA: 100.0000 mg | TOPICAL_CREAM | Freq: Two times a day (BID) | 2 refills | Status: DC

## 2020-06-30 ENCOUNTER — Encounter (INDEPENDENT_AMBULATORY_CARE_PROVIDER_SITE_OTHER): Payer: Self-pay

## 2020-06-30 ENCOUNTER — Telehealth (INDEPENDENT_AMBULATORY_CARE_PROVIDER_SITE_OTHER): Payer: Self-pay

## 2020-06-30 NOTE — Telephone Encounter (Signed)
This is the statement status sent for patient to the REp for [facility] to clear problem for patient. Wanted to make document of in chart.  From: Bayard Beaver P [email or web address]> On [employer] [place] patient billing Sent: Friday, June 27, 2020 8:17 AM To: Donnamarie Rossetti [email or web address]> Subject: Patient bill complaint     Bill Summary - [id number] - [facility] - Madelin Headings     We are resubmitting this claim with the correct primary insurance per eligibility . Please advise patient to disregard invoice. Allow 60 days for a response.        Thank you,  ____________________________     Thea Gist   [phone]  [facility] Triage Team  [address]  [place], [place] [address]     Related image  ?Integrity ?Compassion ?Relationships ?Innovation ?Performance

## 2020-07-01 NOTE — Telephone Encounter (Signed)
Called patient back on 07/01/20

## 2020-07-14 ENCOUNTER — Encounter (INDEPENDENT_AMBULATORY_CARE_PROVIDER_SITE_OTHER): Payer: Self-pay

## 2020-07-22 ENCOUNTER — Other Ambulatory Visit: Payer: Self-pay

## 2020-07-22 ENCOUNTER — Encounter (INDEPENDENT_AMBULATORY_CARE_PROVIDER_SITE_OTHER): Payer: Self-pay | Admitting: Internal Medicine

## 2020-07-22 ENCOUNTER — Telehealth (INDEPENDENT_AMBULATORY_CARE_PROVIDER_SITE_OTHER): Payer: Self-pay

## 2020-07-22 ENCOUNTER — Ambulatory Visit (INDEPENDENT_AMBULATORY_CARE_PROVIDER_SITE_OTHER): Payer: Medicare Other | Admitting: Internal Medicine

## 2020-07-22 VITALS — BP 130/82 | HR 84 | Temp 97.9°F | Resp 18 | Ht 71.0 in | Wt 187.6 lb

## 2020-07-22 DIAGNOSIS — R300 Dysuria: Secondary | ICD-10-CM | POA: Diagnosis not present

## 2020-07-22 MED ORDER — SULFAMETHOXAZOLE-TRIMETHOPRIM 800-160 MG PO TABS: 1.0000 | ORAL_TABLET | Freq: Two times a day (BID) | ORAL | 0 refills | Status: DC

## 2020-07-22 NOTE — Progress Notes (Signed)
dysuria Metrics: Intervention Frequency ACO  Documented Smoking Status Yearly  Screened one or more times in 24 months  Cessation Counseling or  Active cessation medication Past 24 months  Past 24 months   Guideline developer: UpToDate (See UpToDate for funding source) Date Released: 2014       Wellness Office Visit  Subjective:  Patient ID: Tim Rogers, male    DOB: 1956/02/26  Age: 65 y.o. MRN: [id number]  CC: Dysuria HPI  This man comes in for an acute visit with a 5-day history of the above symptoms.  He also has lower abdominal/pelvic discomfort.  He initially also had testicular discomfort/pain.  He denies any hematuria.  He denies any recent sexual encounters.  There has been no fever or chills. A long time ago he did have a bout of prostatitis. Past Medical History:  Diagnosis Date  . Anxiety    2003  . Ascending aortic aneurysm (HCC)   . GERD (gastroesophageal reflux disease)   . Hypertension   . Low testosterone   . PVCs (premature ventricular contractions)    Past Surgical History:  Procedure Laterality Date  . CHOLECYSTECTOMY    . ESOPHAGOGASTRODUODENOSCOPY N/A 09/21/2017   Procedure: ESOPHAGOGASTRODUODENOSCOPY (EGD);  Surgeon: Malissa Hippo, MD;  [facility];  Service: Endoscopy;  Laterality: N/A;  2:55  . TIF procedure       Family History  Problem Relation Age of Onset  . Alcohol abuse Mother   . Cancer Mother        lung  . COPD Mother   . Heart disease Mother   . Early death Mother        age 41 multiple factors  . Cancer Sister        breast    Social History   Social History Narrative   Retired.Single.Lives alone.   Ex-[employer].   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 Smoking status: Never Smoker  . Smokeless tobacco: Former Neurosurgeon    Types: Snuff  Substance Use Topics  . Alcohol use: Yes     Alcohol/week: 5.0 standard drinks    Types: 5 Cans of beer per week    Comment: couple of beer sometimes daily    Current Meds  Medication Sig  . amLODipine (NORVASC) 10 MG tablet TAKE 1 TABLET(10 MG) BY MOUTH DAILY  . azelastine (ASTELIN) 0.1 % nasal spray Place 2 sprays into both nostrils daily as needed for allergies.  . Cholecalciferol (VITAMIN D-3) 125 MCG (5000 UT) TABS Take 10,000 Units by mouth daily at 12 noon.  . Multiple Vitamins-Minerals (MULTIVITAMIN ADULT PO) Take 1 tablet by mouth daily.   . pantoprazole (PROTONIX) 40 MG tablet Take 1 tablet (40 mg total) by mouth daily. Take 30 min before meals  . sulfamethoxazole-trimethoprim (BACTRIM DS) 800-160 MG tablet Take 1 tablet by mouth 2 (two) times daily.  . Testosterone 20 % CREA Apply 100 mg topically 2 (two) times daily.       Objective:   Today's Vitals: BP 130/82 (BP Location: Left Arm, Patient Position: Sitting, Cuff Size: Normal)   Pulse 84   Temp 97.9 F (36.6 C) (Temporal)   Resp 18   Ht 5\' 11"  (1.803 m)   Wt 187 lb 9.6 oz (85.1 kg)   BMI 26.16 kg/m  Vitals with BMI 07/22/2020 05/20/2020  01/28/2020  Height 5\' 11"  5\' 11"  (No Data)  Weight 187 lbs 10 oz 188 lbs 10 oz (No Data)  BMI 26.18 26.32 -  Systolic 130 138  Diastolic 82 80 81  Pulse 84 70 -     Physical Exam Looks systemically well.  Afebrile.      Assessment   1. Dysuria       Tests ordered Orders Placed This Encounter  Procedures  . Urinalysis w microscopic + reflex cultur     Plan: 1. Dipstick urine was unremarkable we will send urine for urinalysis and possible culture.  I think he probably has prostatitis again and I am going to send a prescription for Bactrim DS for 5 days.  If he does not improve, he will let me know.   Meds ordered this encounter  Medications  . sulfamethoxazole-trimethoprim (BACTRIM DS) 800-160 MG tablet    Sig: Take 1 tablet by mouth 2 (two) times daily.    Dispense:  10 tablet    Refill:  0     Tymia Streb , MD

## 2020-07-23 LAB — URINALYSIS W MICROSCOPIC + REFLEX CULTURE
Bacteria, UA: NONE SEEN /HPF
Bilirubin Urine: NEGATIVE
Glucose, UA: NEGATIVE
Hgb urine dipstick: NEGATIVE
Hyaline Cast: NONE SEEN /LPF
Leukocyte Esterase: NEGATIVE
Nitrites, Initial: NEGATIVE
Protein, ur: NEGATIVE
RBC / HPF: NONE SEEN /HPF (ref 0–2)
Specific Gravity, Urine: 1.014 (ref 1.001–1.035)
Squamous Epithelial / HPF: NONE SEEN /HPF (ref <=5)
WBC, UA: NONE SEEN /HPF (ref 0–5)
pH: 6.5 (ref 5.0–8.0)

## 2020-07-23 LAB — NO CULTURE INDICATED

## 2020-07-31 NOTE — Telephone Encounter (Signed)
Noted & completed

## 2020-08-14 ENCOUNTER — Other Ambulatory Visit (INDEPENDENT_AMBULATORY_CARE_PROVIDER_SITE_OTHER): Payer: Self-pay | Admitting: Internal Medicine

## 2020-08-14 ENCOUNTER — Encounter (INDEPENDENT_AMBULATORY_CARE_PROVIDER_SITE_OTHER): Payer: Self-pay

## 2020-08-14 ENCOUNTER — Telehealth (INDEPENDENT_AMBULATORY_CARE_PROVIDER_SITE_OTHER): Payer: Self-pay

## 2020-08-14 DIAGNOSIS — R300 Dysuria: Secondary | ICD-10-CM

## 2020-08-14 MED ORDER — SULFAMETHOXAZOLE-TRIMETHOPRIM 800-160 MG PO TABS: 1.0000 | ORAL_TABLET | Freq: Two times a day (BID) | ORAL | 0 refills | Status: DC

## 2020-08-14 NOTE — Telephone Encounter (Signed)
Call about the problem he had before from prostate he had before.The prostatitis/dysturia? Want to know what to do at this time.

## 2020-08-14 NOTE — Telephone Encounter (Signed)
Okay I will send him a mychart message.

## 2020-08-14 NOTE — Telephone Encounter (Signed)
Pt is said he was on it for 4 wks. So you sent 1 wk, but he will take it. But his research stated he take 4 wks of medication. But he is not the Dr, So he want to see if you want to send him to urology also? /if so he will  agree to go to [facility] urology office.

## 2020-08-14 NOTE — Telephone Encounter (Signed)
Okay, I have referred him to urology in [place].  I have also sent antibiotic for 5 days to [facility] on [address].

## 2020-08-14 NOTE — Telephone Encounter (Signed)
I can send another course of antibiotics if he is agreeable to do this.  Let me know.

## 2020-08-21 ENCOUNTER — Encounter (INDEPENDENT_AMBULATORY_CARE_PROVIDER_SITE_OTHER): Payer: Self-pay | Admitting: Nurse Practitioner

## 2020-08-21 ENCOUNTER — Other Ambulatory Visit: Payer: Self-pay

## 2020-08-21 ENCOUNTER — Ambulatory Visit (INDEPENDENT_AMBULATORY_CARE_PROVIDER_SITE_OTHER): Payer: Medicare Other | Admitting: Nurse Practitioner

## 2020-08-21 VITALS — BP 128/82 | HR 87 | Temp 96.9°F | Ht 70.0 in | Wt 178.4 lb

## 2020-08-21 DIAGNOSIS — Z0000 Encounter for general adult medical examination without abnormal findings: Secondary | ICD-10-CM | POA: Diagnosis not present

## 2020-08-21 NOTE — Patient Instructions (Signed)
  [name] , Thank you for taking time to come for your Medicare Wellness Visit. I appreciate your ongoing commitment to your health goals. Please review the following plan we discussed and let me know if I can assist you in the future.   These are the goals we discussed:  Goals   None     This is a list of the screening recommended for you and due dates:  Health Maintenance  Topic Date Due   Zoster (Shingles) Vaccine (2 of 2) 01/29/2017   COVID-19 Vaccine (4 - Booster for Moderna series) 04/28/2020   Flu Shot  09/29/2020   Pneumonia vaccines (2 of 2 - PPSV23) 12/04/2021   Colon Cancer Screening  11/15/2023   Tetanus Vaccine  08/19/2029   Hepatitis C Screening: USPSTF Recommendation to screen - Ages 18-79 yo.  Completed   HPV Vaccine  Aged Out

## 2020-08-21 NOTE — Progress Notes (Signed)
Subjective:   Tim Rogers is a 65 y.o. male who presents for Medicare Annual/Subsequent preventive examination.  Review of Systems    Weight loss of approximately 4 pounds over approximately 2 weeks.  Cardiac Risk Factors include: advanced age (>28men, >108 women);dyslipidemia;male gender     Objective:    Today's Vitals   08/21/20 0833  BP: 128/82  Pulse: 87  Temp: (!) 96.9 F (36.1 C)  TempSrc: Temporal  SpO2: 95%  Weight: 178 lb 6.4 oz (80.9 kg)  Height: 5\' 10"  (1.778 m)   Body mass index is 25.6 kg/m.  Advanced Directives 08/21/2020 08/20/2019 09/21/2017  Does Patient Have a Medical Advance Directive? No;Yes Yes Yes  Type of Advance Directive Living will Living will Living will  Does patient want to make changes to medical advance directive? No - Patient declined No - Patient declined -  Copy of Healthcare Power of Attorney in Chart? - - No - copy requested  Would patient like information on creating a medical advance directive? No - Patient declined - -    Current Medications (verified) Outpatient Encounter Medications as of 08/21/2020  Medication Sig   amLODipine (NORVASC) 10 MG tablet TAKE 1 TABLET(10 MG) BY MOUTH DAILY   azelastine (ASTELIN) 0.1 % nasal spray Place 2 sprays into both nostrils daily as needed for allergies.   Cholecalciferol (VITAMIN D-3) 125 MCG (5000 UT) TABS Take 10,000 Units by mouth daily at 12 noon.   Multiple Vitamins-Minerals (MULTIVITAMIN ADULT PO) Take 1 tablet by mouth daily.    pantoprazole (PROTONIX) 40 MG tablet Take 1 tablet (40 mg total) by mouth daily. Take 30 min before meals   Testosterone 20 % CREA Apply 100 mg topically 2 (two) times daily.   sulfamethoxazole-trimethoprim (BACTRIM DS) 800-160 MG tablet Take 1 tablet by mouth 2 (two) times daily. (Patient not taking: Reported on 08/21/2020)   No facility-administered encounter medications on file as of 08/21/2020.    Allergies (verified) Patient has no known allergies.    History: Past Medical History:  Diagnosis Date   Anxiety    2003   Ascending aortic aneurysm (HCC)    GERD (gastroesophageal reflux disease)    Low testosterone    PVCs (premature ventricular contractions)    Past Surgical History:  Procedure Laterality Date   CHOLECYSTECTOMY     ESOPHAGOGASTRODUODENOSCOPY N/A 09/21/2017   Procedure: ESOPHAGOGASTRODUODENOSCOPY (EGD);  Surgeon: 09/23/2017, MD;  [facility];  Service: Endoscopy;  Laterality: N/A;  2:55   TIF procedure     Family History  Problem Relation Age of Onset   Alcohol abuse Mother    Cancer Mother        lung   COPD Mother    Heart disease Mother    Early death Mother        age 75 multiple factors   Cancer Sister        breast   Social History   Socioeconomic History   Marital status: Single    Spouse name: Not on file   Number of children: 0   Years of education: 23   Highest education level: Not on file  Occupational History   Occupation: retired  Tobacco Use   Smoking status: Never   Smokeless tobacco: Former    Types: Snuff  Vaping Use   Vaping Use: Never used  Substance and Sexual Activity   Alcohol use: Yes    Alcohol/week: 5.0 standard drinks    Types: 5 Cans of  beer per week    Comment: couple of beer sometimes daily   Drug use: No   Sexual activity: Not Currently    Birth control/protection: None  Other Topics Concern   Not on file  Social History Narrative   Retired.Single.Lives alone.   Ex-[employer].   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   Social Determinants of Corporate investment banker Strain: Not on Ship broker Insecurity: Not on file  Transportation Needs: Not on file  Physical Activity: Not on file  Stress: Not on file  Social Connections: Not on file    Tobacco Counseling Counseling given: Yes   Clinical Intake:  Pre-visit preparation completed: No  Pain :  No/denies pain     BMI - recorded: 25.6 Nutritional Status: BMI 25 -29 Overweight Nutritional Risks: None Diabetes: No  How often do you need to have someone help you when you read instructions, pamphlets, or other written materials from your doctor or pharmacy?: 1 - Never What is the last grade level you completed in school?: 2 MASTER'S DEGREES (PARTIAL PHD)  Diabetic?No  Interpreter Needed?: No  Information entered by :: Jiles Prows, NP-C   Activities of Daily Living In your present state of health, do you have any difficulty performing the following activities: 08/21/2020  Hearing? Y  Vision? N  Difficulty concentrating or making decisions? N  Walking or climbing stairs? N  Dressing or bathing? N  Doing errands, shopping? N  Preparing Food and eating ? N  Using the Toilet? N  In the past six months, have you accidently leaked urine? N  Do you have problems with loss of bowel control? N  Managing your Medications? N  Managing your Finances? N  Housekeeping or managing your Housekeeping? N  Some recent data might be hidden    Patient Care Team: Wilson Singer, MD as PCP - General (Internal Medicine) Jonelle Sidle, MD as PCP - Cardiology (Cardiology)  Indicate any recent Medical Services you may have received from other than [facility] providers in the past year (date may be approximate).     Assessment:   This is a routine wellness examination for Tim Rogers.  Hearing/Vision screen No results found.  Dietary issues and exercise activities discussed: Current Exercise Habits: Home exercise routine, Type of exercise: strength training/weights;walking, Time (Minutes): 60, Frequency (Times/Week): 6, Weekly Exercise (Minutes/Week): 360, Intensity: Moderate, Exercise limited by: None identified   Goals Addressed   None    Depression Screen PHQ 2/9 Scores 08/21/2020 08/20/2019 05/16/2019 10/01/2016  PHQ - 2 Score 0 0 0 0  PHQ- 9 Score 0 - - -  Exception Documentation - -  Medical reason -    Fall Risk Fall Risk  08/21/2020 08/20/2019 05/16/2019 10/01/2016  Falls in the past year? 0 1 0 No  Number falls in past yr: - 0 0 -  Injury with Fall? - 0 0 -  Risk for fall due to : - History of fall(s) No Fall Risks -  Follow up - Education provided;Falls prevention discussed Falls evaluation completed -    FALL RISK PREVENTION PERTAINING TO THE HOME:  Any stairs in or around the home? Yes  If so, are there any without handrails? Yes  Home free of loose throw rugs in walkways, pet beds, electrical cords, etc? Yes  Adequate lighting in your home to reduce risk of falls? Yes  ASSISTIVE DEVICES UTILIZED TO PREVENT FALLS:  Life alert? No  Use of a cane, walker or w/c? Yes  - Walking Stick Grab bars in the bathroom? No  Shower chair or bench in shower? No  Elevated toilet seat or a handicapped toilet? Yes   TIMED UP AND GO:  Was the test performed? Yes .  Length of time to ambulate 10 feet: 4 sec.   Gait steady and fast without use of assistive device  Cognitive Function:     6CIT Screen 08/21/2020 08/20/2019  What Year? 0 points 0 points  What month? 0 points 0 points  What time? 0 points 0 points  Count back from 20 0 points 0 points  Months in reverse 0 points 0 points  Repeat phrase 2 points 2 points  Total Score 2 2    Immunizations Immunization History  Administered Date(s) Administered   Fluad Quad(high Dose 65+) 11/12/2019   Influenza Inj Mdck Quad Pf 12/12/2017   Influenza,inj,Quad PF,6+ Mos 12/04/2016, 10/07/2018   Moderna Sars-Covid-2 Vaccination 04/05/2019, 05/04/2019, 12/28/2019   Pneumococcal Conjugate-13 11/19/2014   Pneumococcal Polysaccharide-23 12/04/2016   Tdap 08/20/2019   Zoster Recombinat (Shingrix) 12/04/2016    TDAP status: Up to date  Flu Vaccine status: Up to date  Pneumococcal vaccine status: Due, Education has been provided regarding the importance of this vaccine. Advised may receive this vaccine at local pharmacy  or Health Dept. Aware to provide a copy of the vaccination record if obtained from local pharmacy or Health Dept. Verbalized acceptance and understanding.  Covid-19 vaccine status: Completed vaccines  Qualifies for Shingles Vaccine? Yes   Zostavax completed Yes   Shingrix Completed?: Yes  Screening Tests Health Maintenance  Topic Date Due   Zoster Vaccines- Shingrix (2 of 2) 01/29/2017   COVID-19 Vaccine (4 - Booster for Moderna series) 04/28/2020   INFLUENZA VACCINE  09/29/2020   PNA vac Low Risk Adult (2 of 2 - PPSV23) 12/04/2021   COLONOSCOPY (Pts 45-49yrs Insurance coverage will need to be confirmed)  11/15/2023   TETANUS/TDAP  08/19/2029   Hepatitis C Screening  Completed   HPV VACCINES  Aged Out    Health Maintenance  Health Maintenance Due  Topic Date Due   Zoster Vaccines- Shingrix (2 of 2) 01/29/2017   COVID-19 Vaccine (4 - Booster for Moderna series) 04/28/2020    Colorectal cancer screening: Type of screening: Colonoscopy. Completed 10/2013. Repeat every 10 years  Lung Cancer Screening: (Low Dose CT Chest recommended if Age 80-80 years, 30 pack-year currently smoking OR have quit w/in 15years.) does not qualify.   Lung Cancer Screening Referral: N/A  Additional Screening:  Hepatitis C Screening: does not qualify; Completed 10/2018  Vision Screening: Recommended annual ophthalmology exams for early detection of glaucoma and other disorders of the eye. Is the patient up to date with their annual eye exam?  Yes  Who is the provider or what is the name of the office in which the patient attends annual eye exams? [facility]  If pt is not established with a provider, would they like to be referred to a provider to establish care? No .   Dental Screening: Recommended annual dental exams for proper oral hygiene  Community Resource Referral / Chronic Care Management: CRR required this visit?  No   CCM required this visit?  No      Plan:  Patient did report concern  of unintentional weight loss over the last couple weeks.  He was placed on antibiotics and  while he was on the antibiotics he stopped drinking beer.  He tells me he was drinking 2 to 3 cans of beer daily, and wanted to see if this could be related to the weight loss.  I told him more than likely the weight loss can be contributed to him stopping the alcohol intake.  I told him that if he continues to lose weight despite having stopped the alcohol that he needs to notify us because we need to do further evaluation of the weight loss.  He is agreeable to this and will keep us notified.  He would be due for PCV 13 vaccine, but would like to consider getting this at the pharmacy as opposed to here due to concerns with Medicare covering the vaccine be administered in the office today.  I have personally reviewed and noted the following in the patient's chart:   Medical and social history Use of alcohol, tobacco or illicit drugs  Current medications and supplements including opioid prescriptions. Patient is not currently taking opioid prescriptions. Functional ability and status Nutritional status Physical activity Advanced directives List of other physicians Hospitalizations, surgeries, and ER visits in previous 12 months Vitals Screenings to include cognitive, depression, and falls Referrals and appointments  In addition, I have reviewed and discussed with patient certain preventive protocols, quality metrics, and best practice recommendations. A written personalized care plan for preventive services as well as general preventive health recommendations were provided to patient.    Patient will follow-up as scheduled in November of this year. He was encouraged to call with any questions or concerns prior to this upcoming appointment.   Elenore PaddySARAH E Kydan Shanholtzer, NP   08/21/2020

## 2020-09-10 ENCOUNTER — Ambulatory Visit (INDEPENDENT_AMBULATORY_CARE_PROVIDER_SITE_OTHER): Payer: Medicare Other | Admitting: Internal Medicine

## 2020-09-10 ENCOUNTER — Other Ambulatory Visit: Payer: Self-pay

## 2020-09-10 ENCOUNTER — Telehealth (INDEPENDENT_AMBULATORY_CARE_PROVIDER_SITE_OTHER): Payer: Self-pay

## 2020-09-10 ENCOUNTER — Encounter (INDEPENDENT_AMBULATORY_CARE_PROVIDER_SITE_OTHER): Payer: Self-pay | Admitting: Internal Medicine

## 2020-09-10 VITALS — BP 122/72 | HR 89 | Temp 97.3°F | Ht 71.0 in | Wt 183.8 lb

## 2020-09-10 DIAGNOSIS — N410 Acute prostatitis: Secondary | ICD-10-CM | POA: Diagnosis not present

## 2020-09-10 MED ORDER — SULFAMETHOXAZOLE-TRIMETHOPRIM 800-160 MG PO TABS: 1.0000 | ORAL_TABLET | Freq: Two times a day (BID) | ORAL | 1 refills | Status: DC

## 2020-09-10 NOTE — Progress Notes (Signed)
Metrics: Intervention Frequency ACO  Documented Smoking Status Yearly  Screened one or more times in 24 months  Cessation Counseling or  Active cessation medication Past 24 months  Past 24 months   Guideline developer: UpToDate (See UpToDate for funding source) Date Released: 2014       Wellness Office Visit  Subjective:  Patient ID: Tim Rogers, male    DOB: [birth year]  Age: 65 y.o. MRN: [id number]  CC: Prostatitis HPI This man comes in again with symptoms of prostatitis with pain on sitting, some urinary difficulties.  He was on a 5-day course of Bactrim which seem to help and and completely resolved his symptoms but then the symptoms came back. He denies any hematuria.  He does have an appointment to see urology in the beginning of August in about 3 weeks time.  Past Medical History:  Diagnosis Date   Anxiety    2003   Ascending aortic aneurysm (HCC)    GERD (gastroesophageal reflux disease)    Low testosterone    PVCs (premature ventricular contractions)    Past Surgical History:  Procedure Laterality Date   CHOLECYSTECTOMY     ESOPHAGOGASTRODUODENOSCOPY N/A 09/21/2017   Procedure: ESOPHAGOGASTRODUODENOSCOPY (EGD);  Surgeon: Malissa Hippo, MD;  [facility];  Service: Endoscopy;  Laterality: N/A;  2:55   TIF procedure       Family History  Problem Relation Age of Onset   Alcohol abuse Mother    Cancer Mother        lung   COPD Mother    Heart disease Mother    Early death Mother        age 89 multiple factors   Cancer Sister        breast    Social History   Social History Narrative   Retired.Single.Lives alone.   Ex-[employer].   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   Cholecalciferol (VITAMIN D-3) 125 MCG (5000 UT) TABS Take 10,000 Units by mouth daily at 12 noon.   Multiple Vitamins-Minerals (MULTIVITAMIN ADULT PO) Take 1 tablet by mouth daily.    pantoprazole (PROTONIX) 40 MG tablet Take 1 tablet (40 mg total) by mouth daily. Take 30 min before meals   sulfamethoxazole-trimethoprim (BACTRIM DS) 800-160 MG tablet Take 1 tablet by mouth 2 (two) times daily.   Testosterone 20 % CREA Apply 100 mg topically 2 (two) times daily.     Flowsheet Row Clinical Support from 08/21/2020 in [facility]  PHQ-9 Total Score 0       Objective:   Today's Vitals: BP 122/72   Pulse 89   Temp (!) 97.3 F (36.3 C) (Temporal)   Ht 5\' 11"  (1.803 m)   Wt 183 lb 12.8 oz (83.4 kg)   SpO2 96%   BMI 25.63 kg/m  Vitals with BMI 09/10/2020 08/21/2020 07/22/2020  Height 5\' 11"  5\' 10"  5\' 11"   Weight 183 lbs 13 oz 178 lbs 6 oz 187 lbs 10 oz  BMI 25.65 25.6 26.18  Systolic 122 128  Diastolic 72 82 82  Pulse 89 87 84     Physical Exam  He looks systemically well.  He does not appear to be in acute pain.  He is afebrile.     Assessment   1. Acute prostatitis       Tests ordered Orders Placed This Encounter  Procedures   Urinalysis w microscopic + reflex cultur      Plan: 1.  We will send the urine for urinalysis again and make sure there is no UTI but in the meantime I will put him on a 3-week course of Bactrim DS.  He will then be able to see the urologist after that.    Meds ordered this encounter  Medications   sulfamethoxazole-trimethoprim (BACTRIM DS) 800-160 MG tablet    Sig: Take 1 tablet by mouth 2 (two) times daily.    Dispense:  42 tablet     Refill:  1     Ulyess Muto , MD

## 2020-09-10 NOTE — Telephone Encounter (Signed)
Pt called very upset , angry about pain & suffering for prostate pain. Pt called stating he is having same problem again. But both [facility] & Ronne Binning is have no way to see until Sept.  Pt not willing to go to the [facility] [place] to see a urologist.   Asked if you will send him something or see him?

## 2020-09-11 LAB — URINALYSIS W MICROSCOPIC + REFLEX CULTURE
Bacteria, UA: NONE SEEN /HPF
Bilirubin Urine: NEGATIVE
Glucose, UA: NEGATIVE
Hgb urine dipstick: NEGATIVE
Hyaline Cast: NONE SEEN /LPF
Leukocyte Esterase: NEGATIVE
Nitrites, Initial: NEGATIVE
Specific Gravity, Urine: 1.017 (ref 1.001–1.035)
Squamous Epithelial / HPF: NONE SEEN /HPF (ref <=5)
WBC, UA: NONE SEEN /HPF (ref 0–5)
pH: 6 (ref 5.0–8.0)

## 2020-09-11 LAB — NO CULTURE INDICATED

## 2020-10-02 ENCOUNTER — Ambulatory Visit (HOSPITAL_COMMUNITY)
Admission: RE | Admit: 2020-10-02 | Discharge: 2020-10-02 | Disposition: A | Discharge status: Home / Self Care | Payer: Medicare Other | Source: Ambulatory Visit | Attending: Urology | Admitting: Urology

## 2020-10-02 ENCOUNTER — Other Ambulatory Visit: Payer: Self-pay

## 2020-10-02 ENCOUNTER — Ambulatory Visit (INDEPENDENT_AMBULATORY_CARE_PROVIDER_SITE_OTHER): Payer: Medicare Other | Admitting: Urology

## 2020-10-02 ENCOUNTER — Encounter: Payer: Self-pay | Admitting: Urology

## 2020-10-02 VITALS — BP 138/79 | HR 91

## 2020-10-02 DIAGNOSIS — E291 Testicular hypofunction: Secondary | ICD-10-CM

## 2020-10-02 DIAGNOSIS — R300 Dysuria: Secondary | ICD-10-CM | POA: Diagnosis present

## 2020-10-02 DIAGNOSIS — R102 Pelvic and perineal pain: Secondary | ICD-10-CM | POA: Insufficient documentation

## 2020-10-02 LAB — URINALYSIS, ROUTINE W REFLEX MICROSCOPIC
Bilirubin, UA: NEGATIVE
Glucose, UA: NEGATIVE
Ketones, UA: NEGATIVE
Leukocytes,UA: NEGATIVE
Nitrite, UA: NEGATIVE
Protein,UA: NEGATIVE
Specific Gravity, UA: 1.01 (ref 1.005–1.030)
Urobilinogen, Ur: 0.2 mg/dL (ref 0.2–1.0)
pH, UA: 6.5 (ref 5.0–7.5)

## 2020-10-02 LAB — MICROSCOPIC EXAMINATION
Bacteria, UA: NONE SEEN
Epithelial Cells (non renal): NONE SEEN /hpf (ref 0–10)
Renal Epithel, UA: NONE SEEN /hpf
WBC, UA: NONE SEEN /hpf (ref 0–5)

## 2020-10-02 NOTE — Progress Notes (Signed)
Urological Symptom Review  Patient is experiencing the following symptoms: Penile pain (male only)    Review of Systems  Gastrointestinal (upper)  : Indigestion/heartburn  Gastrointestinal (lower) : Negative for lower GI symptoms  Constitutional : Negative for symptoms  Skin: Negative for skin symptoms  Eyes: Negative for eye symptoms  Ear/Nose/Throat : Sinus problems  Hematologic/Lymphatic: Negative for Hematologic/Lymphatic symptoms  Cardiovascular : Negative for cardiovascular symptoms  Respiratory : Negative for respiratory symptoms  Endocrine: Negative for endocrine symptoms  Musculoskeletal: Negative for musculoskeletal symptoms  Neurological: Negative for neurological symptoms  Psychologic: Anxiety

## 2020-10-02 NOTE — Progress Notes (Signed)
Subjective: 1. Dysuria   2. Pelvic pain in male   3. Hypogonadism in male      Consult requested by Dr. Thomasene Ripple is a 65 yo male who is sent for evaluation of perineal pain.  He got an antibiotic for 5 days and his symptoms recurred.  He got another 7 days but the symptoms recurred.  He has now been on the bactrim for 7 weeks.  He has no further pain with ejaculation but he still can have some suprapubic discomfort.  He has increased symptoms with tea or diet coke.  Beer will also impact it.  He is voiding well with an IPSS of 1.  He has minimal change in his bowel habits.   He has had no documented stones or GU surgery.  He thinks he may have passed a stone 4 weeks ago which he had a sharp burning at the tip of the penis x 1. He had a CT AP in 2006 and there were no stones.  He had UA's on 5/24 and 7/13 that had 0-2 RBC's on one.  He has 0-2 RBC's today.   HIs PSA was 1.1 on 05/14/20.  He is on TRT currently with a topical but he had been getting [facility] from [facility].  He takes a lot of Vitamins.  His most recent T is about 1000.   ROS:  ROS  No Known Allergies  Past Medical History:  Diagnosis Date   Acid reflux    Anxiety    2003   Ascending aortic aneurysm (HCC)    GERD (gastroesophageal reflux disease)    Low testosterone    PVCs (premature ventricular contractions)     Past Surgical History:  Procedure Laterality Date   CHOLECYSTECTOMY     ESOPHAGOGASTRODUODENOSCOPY N/A 09/21/2017   Procedure: ESOPHAGOGASTRODUODENOSCOPY (EGD);  Surgeon: Malissa Hippo, MD;  [facility];  Service: Endoscopy;  Laterality: N/A;  2:55   TIF procedure      Social History   Socioeconomic History   Marital status: Single    Spouse name: Not on file   Number of children: 0   Years of education: 64   Highest education level: Not on file  Occupational History   Occupation: retired  Tobacco Use   Smoking status: Never   Smokeless tobacco: Former    Types:  Snuff  Vaping Use   Vaping Use: Never used  Substance and Sexual Activity   Alcohol use: Yes    Alcohol/week: 5.0 standard drinks    Types: 5 Cans of beer per week    Comment: couple of beer sometimes daily   Drug use: No   Sexual activity: Not Currently    Birth control/protection: None  Other Topics Concern   Not on file  Social History Narrative   Retired.Single.Lives alone.   Ex-[employer].   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   Social Determinants of Corporate investment banker Strain: Not on [employer] Insecurity: Not on file  Transportation Needs: Not on file  Physical Activity: Not on file  Stress: Not on file  Social Connections: Not on file  Intimate Partner Violence: Not on file    Family History  Problem Relation Age of Onset   Alcohol abuse Mother    Cancer Mother        lung   COPD  Mother    Heart disease Mother    Early death Mother        age 51 multiple factors   Cancer Sister        breast    Anti-infectives: Anti-infectives (From admission, onward)    None       Current Outpatient Medications  Medication Sig Dispense Refill   amLODipine (NORVASC) 10 MG tablet TAKE 1 TABLET(10 MG) BY MOUTH DAILY 90 tablet 2   azelastine (ASTELIN) 0.1 % nasal spray Place 2 sprays into both nostrils daily as needed for allergies. 30 mL 3   Cholecalciferol (VITAMIN D-3) 125 MCG (5000 UT) TABS Take 10,000 Units by mouth daily at 12 noon.     Multiple Vitamins-Minerals (MULTIVITAMIN ADULT PO) Take 1 tablet by mouth daily.      pantoprazole (PROTONIX) 40 MG tablet Take 1 tablet (40 mg total) by mouth daily. Take 30 min before meals 90 tablet 3   sulfamethoxazole-trimethoprim (BACTRIM DS) 800-160 MG tablet Take 1 tablet by mouth 2 (two) times daily. 42 tablet 1   Testosterone 20 % CREA Apply 100 mg topically 2 (two) times daily. 100 g 2   valACYclovir (VALTREX) 1000 MG tablet Take  1,000 mg by mouth 2 (two) times daily.     No current facility-administered medications for this visit.     Objective: Vital signs in last 24 hours: BP 138/79   Pulse 91   Intake/Output from previous day: No intake/output data recorded. Intake/Output this shift: @IOTHISSHIFT @   Physical Exam Vitals reviewed.  Constitutional:      Appearance: Normal appearance.  Abdominal:     General: Abdomen is flat.     Palpations: Abdomen is soft.     Tenderness: There is no abdominal tenderness.     Hernia: No hernia is present.  Genitourinary:    Comments: Normal phallus with adequate meatus. Scrotum, Testes and epididymis. AP is without lesions. NST without mass. Prostate is 1.5+ benign. SV non-palpable.  Neurological:     General: No focal deficit present.     Mental Status: He is alert and oriented to person, place, and time.  Psychiatric:     Comments: He is rather hyper and talkative and reports likely the high level of testosterone and the way it makes him feel.     Lab Results:  Recent Results (from the past 2160 hour(s))  Urinalysis w microscopic + reflex cultur     Status: Abnormal   Collection Time: 07/22/20  2:24 PM   Specimen: Urine  Result Value Ref Range   Color, Urine YELLOW YELLOW   APPearance CLEAR CLEAR   Specific Gravity, Urine 1.014 1.001 - 1.035   pH 6.5 5.0 - 8.0   Glucose, UA NEGATIVE NEGATIVE   Bilirubin Urine NEGATIVE NEGATIVE   Ketones, ur TRACE (A) NEGATIVE   Hgb urine dipstick NEGATIVE NEGATIVE   Protein, ur NEGATIVE NEGATIVE   Nitrites, Initial NEGATIVE NEGATIVE   Leukocyte Esterase NEGATIVE NEGATIVE   WBC, UA NONE SEEN 0 - 5 /HPF   RBC / HPF NONE SEEN 0 - 2 /HPF   Squamous Epithelial / LPF NONE SEEN < OR = 5 /HPF   Bacteria, UA NONE SEEN NONE SEEN /HPF   Hyaline Cast NONE SEEN NONE SEEN /LPF   Note      Comment: This urine was analyzed for the presence of WBC,  RBC, bacteria, casts, and other formed elements.  Only those elements seen  were reported. . .   REFLEXIVE  URINE CULTURE     Status: None   Collection Time: 07/22/20  2:24 PM  Result Value Ref Range   Reflexve Urine Culture      Comment: NO CULTURE INDICATED  Urinalysis w microscopic + reflex cultur     Status: Abnormal   Collection Time: 09/10/20 10:47 AM   Specimen: Urine  Result Value Ref Range   Color, Urine DARK YELLOW YELLOW   APPearance CLEAR CLEAR   Specific Gravity, Urine 1.017 1.001 - 1.035   pH 6.0 5.0 - 8.0   Glucose, UA NEGATIVE NEGATIVE   Bilirubin Urine NEGATIVE NEGATIVE    Comment: Verified by repeat analysis. Marland Kitchen    Ketones, ur 1+ (A) NEGATIVE   Hgb urine dipstick NEGATIVE NEGATIVE   Protein, ur TRACE (A) NEGATIVE   Nitrites, Initial NEGATIVE NEGATIVE   Leukocyte Esterase NEGATIVE NEGATIVE   WBC, UA NONE SEEN 0 - 5 /HPF   RBC / HPF 0-2 0 - 2 /HPF   Squamous Epithelial / LPF NONE SEEN < OR = 5 /HPF   Bacteria, UA NONE SEEN NONE SEEN /HPF   Hyaline Cast NONE SEEN NONE SEEN /LPF  REFLEXIVE URINE CULTURE     Status: None   Collection Time: 09/10/20 10:47 AM  Result Value Ref Range   Reflexve Urine Culture      Comment: NO CULTURE INDICATED  Urinalysis, Routine w reflex microscopic     Status: Abnormal   Collection Time: 10/02/20  2:44 PM  Result Value Ref Range   Specific Gravity, UA 1.010 1.005 - 1.030   pH, UA 6.5 5.0 - 7.5   Color, UA Yellow Yellow   Appearance Ur Clear Clear   Leukocytes,UA Negative Negative   Protein,UA Negative Negative/Trace   Glucose, UA Negative Negative   Ketones, UA Negative Negative   RBC, UA Trace (A) Negative   Bilirubin, UA Negative Negative   Urobilinogen, Ur 0.2 0.2 - 1.0 mg/dL   Nitrite, UA Negative Negative   Microscopic Examination See below:   Microscopic Examination     Status: None   Collection Time: 10/02/20  2:44 PM   Urine  Result Value Ref Range   WBC, UA None seen 0 - 5 /hpf   RBC 0-2 0 - 2 /hpf   Epithelial Cells (non renal) None seen 0 - 10 /hpf   Renal Epithel, UA None seen  None seen /hpf   Mucus, UA Present Not Estab.   Bacteria, UA None seen None seen/Few   .brief  BMET No results for input(s): NA, K, CL, CO2, GLUCOSE, BUN, CREATININE, CALCIUM in the last 72 hours. PT/INR No results for input(s): LABPROT, INR in the last 72 hours. ABG No results for input(s): PHART, HCO3 in the last 72 hours.  Invalid input(s): PCO2, PO2   Studies/Results: I have reviewed records from Dr. Karilyn Cota along with labs and UA's as noted above. I have reviewed a prior CT abdomen.  KUB was done this PM but the read is not back.  There is a calcification to the right of midline over the pubis.  This is most likely a prostatic calcification but it could be a bladder stone or possibly a right distal stone.  There is another shadow on the right more proximally in the pelvis that could also be a stone but it is much fainter.   Assessment/Plan: Dysuria with suprapubic pain with negative UA's and a partial response to antibiotic therapy and dietary irritant restrictions.   I am going to have  him complete the bactrim.   I have discussed alpha blockers and NSAID's.  He is concerned about the alpha blocker side effects.   He is on multiple supplements and I suggest that those may potentially contain ingredients that could contribute to his symptoms.   He will continue to avoid the known irritants.   I am going to get a KUB today since he has 0-2 RBC's on UA today and had a penile sensation suggestive of a distal stone which I have seen cause these symptoms. (KUB suggests possible stones in the pelvis and I will get a CT stone study for clarity.   Hypogonadism.  He has been managed previously with testosterone pellets but is now on a topical agent.   I discussed considering testopel which should be covered by Medicare and available in our office.   No orders of the defined types were placed in this encounter.    Orders Placed This Encounter  Procedures   DG Abd 1 View    Order Specific  Question:   Reason for Exam (SYMPTOM  OR DIAGNOSIS REQUIRED)    Answer:   pelvic pain with dysuria.  R/o stone.    Order Specific Question:   Preferred imaging location?    Answer:   [facility]    Order Specific Question:   Radiology Contrast Protocol - do NOT remove file path    Answer:   [email or web address]   Urinalysis, Routine w reflex microscopic     Return in about 6 weeks (around 11/13/2020).    CC: Dr Lilly CoveNimish Gosrani.      Bjorn PippinJohn Chanin Frumkin 10/02/2020 [phone]

## 2020-10-03 ENCOUNTER — Telehealth: Payer: Self-pay

## 2020-10-03 DIAGNOSIS — R102 Pelvic and perineal pain: Secondary | ICD-10-CM

## 2020-10-03 NOTE — Telephone Encounter (Signed)
Order placed. Patient called and made aware. 

## 2020-10-03 NOTE — Telephone Encounter (Signed)
-----   Message from Bjorn Pippin, MD sent at 10/02/2020  6:58 PM EDT ----- His KUB shows a calcification in the right lower pelvis.  It could be a prostatic stone but it is also possible that it could be a distal ureteral stone.  There is another fainter shadow in the right lateral pelvis that also could potentially be a stone.   I would like to get a CT stone study to clarify the location of the calcifications.

## 2020-10-29 ENCOUNTER — Other Ambulatory Visit (INDEPENDENT_AMBULATORY_CARE_PROVIDER_SITE_OTHER): Payer: Self-pay

## 2020-10-29 ENCOUNTER — Other Ambulatory Visit: Payer: Self-pay | Admitting: Cardiology

## 2020-10-29 DIAGNOSIS — K219 Gastro-esophageal reflux disease without esophagitis: Secondary | ICD-10-CM

## 2020-10-29 MED ORDER — PANTOPRAZOLE SODIUM 40 MG PO TBEC: 40.0000 mg | DELAYED_RELEASE_TABLET | Freq: Every day | ORAL | 0 refills | Status: DC

## 2020-11-13 ENCOUNTER — Ambulatory Visit (INDEPENDENT_AMBULATORY_CARE_PROVIDER_SITE_OTHER): Payer: Medicare Other | Admitting: Gastroenterology

## 2020-11-20 ENCOUNTER — Ambulatory Visit (HOSPITAL_COMMUNITY)
Admission: RE | Admit: 2020-11-20 | Discharge: 2020-11-20 | Disposition: A | Discharge status: Home / Self Care | Payer: Medicare Other | Source: Ambulatory Visit | Attending: Urology | Admitting: Urology

## 2020-11-20 ENCOUNTER — Encounter (INDEPENDENT_AMBULATORY_CARE_PROVIDER_SITE_OTHER): Payer: Self-pay | Admitting: Gastroenterology

## 2020-11-20 ENCOUNTER — Ambulatory Visit (INDEPENDENT_AMBULATORY_CARE_PROVIDER_SITE_OTHER): Payer: Medicare Other | Admitting: Gastroenterology

## 2020-11-20 ENCOUNTER — Other Ambulatory Visit: Payer: Self-pay

## 2020-11-20 ENCOUNTER — Encounter (INDEPENDENT_AMBULATORY_CARE_PROVIDER_SITE_OTHER): Payer: Self-pay

## 2020-11-20 VITALS — BP 122/80 | HR 87 | Temp 98.5°F | Ht 71.0 in | Wt 183.0 lb

## 2020-11-20 DIAGNOSIS — R102 Pelvic and perineal pain: Secondary | ICD-10-CM | POA: Diagnosis not present

## 2020-11-20 DIAGNOSIS — K6289 Other specified diseases of anus and rectum: Secondary | ICD-10-CM | POA: Insufficient documentation

## 2020-11-20 DIAGNOSIS — K219 Gastro-esophageal reflux disease without esophagitis: Secondary | ICD-10-CM | POA: Diagnosis not present

## 2020-11-20 DIAGNOSIS — L290 Pruritus ani: Secondary | ICD-10-CM | POA: Insufficient documentation

## 2020-11-20 DIAGNOSIS — K649 Unspecified hemorrhoids: Secondary | ICD-10-CM

## 2020-11-20 MED ORDER — HYDROCORTISONE ACETATE 25 MG RE SUPP: 25.0000 mg | Freq: Two times a day (BID) | RECTAL | 3 refills | Status: DC | PRN

## 2020-11-20 NOTE — Patient Instructions (Signed)
Continue protonix 40mg  daily, 30-45 mins before breakfast, avoid greasy/spicy/heavy foods late in the day as well as attempt to stay sitting upright 2-3 hours after eating. Rx for annusol suppositories sent to your pharmacy, you can use these up to twice a day as needed. You can also try sitz baths to help with discomfort. Continue to use align and metamucil to keep your stools soft as this will also help your hemorrhoids  Plan for colonoscopy in 2024  Follow up 1 year

## 2020-11-20 NOTE — Progress Notes (Signed)
Referring Provider: Wilson Singer, MD Primary Care Physician:  Wilson Singer, MD (Inactive) Primary GI Physician: Rehman  Chief Complaint  Patient presents with   Follow-up    1 year follow up. Was on antibiotics for about 5 weeks for prostate infection. Started on align. Having ct today to check for kidney stone. Having some anal burning. Has one to two Stools a day. Taking metamucil. Appetite is good.    HPI:   Tim Rogers is a 65 y.o. male with past medical history of acid reflux, anxiety, ascending aortic aneurysm, low testosterone and PVCs.   Patient presenting today for follow up of GERD with reports of some hemorrhoidal irritation as well.   GERD: currently on protonix 40mg  daily. Reports he occasionally belches and may have an infrequent episode of acid regurgitation, however, this is rare. Taking medication in the morning before eating and he feels reflux symptoms are well managed at this time.  He reports recent course of antibiotics (bactrim) for prostate infection (6 week course). Reports he started having some pain in his stomach, had xray of abdomen done which showed some gas pockets but no other abnormalities. States he started align probiotic which has helped with his gas/abdominal pain. If he does experience the pain he does some yoga moves to help with flatulence release or will take gas-ex if yoga does not improve his symptoms. He reports having 1-2 BMs per day, he takes metamucil in the evening to help with regularity, BMs are soft and he has no issues with needing to strain.   He reports some burning/irritation of his anal area, thinks he has some hemorrhoids. Denies any bleeding or severe pain. Using preparation H with some relief, however, he would like something stronger to help with the itching/burning. States that symptoms are very manageable at this time but he is concerned that with him becoming less active as the weather gets cooler, he may have more  issues with this.    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   Last Colonoscopy:(2014)-Dr. Bloom-2 sessile polyps descending and sigmoid. Small internal and external hemorrhoids, pathology hyperplastic, recommends 5 year repeat Last Endoscopy:(2019) normal esophagus, z line irregular 39 cm from incisors, 2cm HH, suture at fundus from previous surgery, normal duodenal bulb and second portion of duodenum, no specimens collected.   Social hx:1-2 beers/day, no tobacco, no illicit drug use Family [employer]  Recommendations:   Past Medical History:  Diagnosis Date   Acid reflux    Anxiety    2003   Ascending aortic aneurysm (HCC)    GERD (gastroesophageal reflux disease)    Low testosterone    PVCs (premature ventricular contractions)     Past Surgical History:  Procedure Laterality Date   CHOLECYSTECTOMY     ESOPHAGOGASTRODUODENOSCOPY N/A 09/21/2017   Procedure: ESOPHAGOGASTRODUODENOSCOPY (EGD);  Surgeon: 09/23/2017, MD;  [facility]: [facility];  Service: Endoscopy;  Laterality: N/A;  2:55   TIF procedure      Current Outpatient Medications  Medication Sig Dispense Refill   amLODipine (NORVASC) 10 MG tablet TAKE 1 TABLET(10 MG) BY MOUTH DAILY 90 tablet 1   azelastine (ASTELIN) 0.1 % nasal spray Place 2 sprays into both nostrils daily as needed for allergies. 30 mL 3   Cholecalciferol (VITAMIN D-3) 125 MCG (5000 UT) TABS Take 10,000 Units by mouth daily at  12 noon.     loratadine (CLARITIN) 10 MG tablet Take 10 mg by mouth daily.     OVER THE COUNTER MEDICATION AG1 - one daily     pantoprazole (PROTONIX) 40 MG tablet Take 1 tablet (40 mg total) by mouth daily. Take 30 min before meals 90 tablet 0   Testosterone 20 % CREA Apply 100 mg topically 2 (two) times daily. 100 g 2   valACYclovir (VALTREX) 1000 MG tablet Take 1,000 mg by mouth 2 (two) times daily.     No current  facility-administered medications for this visit.    Allergies as of 11/20/2020   (No Known Allergies)    Family History  Problem Relation Age of Onset   Alcohol abuse Mother    Cancer Mother        lung   COPD Mother    Heart disease Mother    Early death Mother        age 41 multiple factors   Cancer Sister        breast    Social History   Socioeconomic History   Marital status: Single    Spouse name: Not on file   Number of children: 0   Years of education: 44   Highest education level: Not on file  Occupational History   Occupation: retired  Tobacco Use   Smoking status: Never   Smokeless tobacco: Former    Types: Snuff  Vaping Use   Vaping Use: Never used  Substance and Sexual Activity   Alcohol use: Yes    Alcohol/week: 5.0 standard drinks    Types: 5 Cans of beer per week    Comment: couple of beer sometimes daily   Drug use: No   Sexual activity: Not Currently    Birth control/protection: None  Other Topics Concern   Not on file  Social History Narrative   Retired.Single.Lives alone.   Ex-[employer].   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 Denies fatigue, weakness, weight loss.  CV: Denies chest pain, palpitations, syncope, peripheral edema, and claudication. Resp: Denies dyspnea at rest, cough, wheezing, coughing up blood, and pleurisy. GI: Denies vomiting blood, jaundice, and fecal incontinence. Denies dysphagia or odynophagia. Derm: Denies rash, itching, dry skin Psych: Denies depression, anxiety, memory loss, confusion. No homicidal or suicidal ideation.  Heme: Denies bruising,  bleeding, and enlarged lymph nodes.  Physical Exam: BP 122/80 (BP Location: Right Arm, Patient Position: Sitting, Cuff Size: Large)   Pulse 87   Temp 98.5 F (36.9 C) (Oral)   Ht 5\' 11"  (1.803 m)   Wt 183 lb (83 kg)   BMI 25.52 kg/m  General:   Alert and oriented. No distress noted. Pleasant and cooperative.  Head:  Normocephalic and atraumatic. Eyes:  Conjuctiva clear without scleral icterus. Mouth:  Oral mucosa pink and moist. Good dentition. No lesions. Heart: Normal rate and rhythm, s1 and s2 heart sounds present.  Lungs: Clear lung sounds in all lobes. Respirations equal and unlabored. Abdomen:  +BS, soft, non-tender and non-distended. No rebound or guarding. No HSM or masses noted.  Derm: No palmar erythema or jaundice Msk:  Symmetrical without gross deformities. Normal posture. Extremities:  Without edema. Neurologic:  Alert and  oriented x4 Psych:  Alert and cooperative. Normal mood and affect.  Invalid input(s): 6 MONTHS   ASSESSMENT: Tim Rogers is a 65 y.o. male presenting today for follow up of GERD, also experiencing some discomfort of hemorrhoids.  Doing well on Protonix 40mg  once daily. Occasional belching with some acid regurgitation, however, this is infrequent and he feels symptoms are well managed. He inquired about any new medications that are not PPIs. We discussed H2B, however, patient stated he had researched these as well and found they have similar side effects at PPI. He is tolerating PPI well, has been on protonix approx 15 years off and on. Desires to continue on this medication at this time.  Reports some issues with anal itching/burning, has been using preparation H with some relief. However he is requesting something stronger to use (does not want a cream, he prefers suppositories only). Rx for annusol suppositories sent to pharmacy to use up to BID PRN. Can also do sitz baths and continue witch hazel wipes with good result. He denies any pain or  bleeding.  No red flag symptoms. Patient denies melena, hematochezia, nausea, vomiting, diarrhea, constipation, dysphagia, odyonophagia, early satiety or weight loss.    Due for colonoscopy in 2024, he would like to go ahead and proceed with EGD as well at that time since he is concerned for Barrett's esophagus given his long history of GERD. He is having no alarm symptoms at this time, no previous history of Barrett's esophagus. We can reevaluate need for EGD closer to time of colonoscopy.    PLAN:  1.continue protonix 40mg  once daily 2. Annusol suppositories BID PRN 3.can do sitz baths for hemorrhoids as well 4. Continue align and metamucil with good result 5. Plan for colonoscopy in 2024   Follow Up: 1 year  Bashir Marchetti L. , MSN, APRN, AGNP-C Adult-Gerontology Nurse Practitioner [facility]

## 2020-11-24 ENCOUNTER — Telehealth (INDEPENDENT_AMBULATORY_CARE_PROVIDER_SITE_OTHER): Payer: Self-pay

## 2020-11-24 ENCOUNTER — Other Ambulatory Visit (INDEPENDENT_AMBULATORY_CARE_PROVIDER_SITE_OTHER): Payer: Self-pay | Admitting: Gastroenterology

## 2020-11-24 MED ORDER — HYDROCORTISONE (PERIANAL) 2.5 % EX CREA: 1.0000 "application " | TOPICAL_CREAM | Freq: Two times a day (BID) | CUTANEOUS | 2 refills | Status: DC

## 2020-11-24 NOTE — Progress Notes (Signed)
Anusol suppositories not covered by patient's insurance, anusol cream sent in to patient's pharmacy, per his request

## 2020-11-24 NOTE — Telephone Encounter (Signed)
Patient's Hydrocortisone acetate 25 mg Suppositories are not covered by patient's insurance.Patient made aware. He would like [place] to send in the cream for him please to [facility].

## 2020-11-24 NOTE — Progress Notes (Signed)
Results sent via my chart 

## 2020-11-27 ENCOUNTER — Ambulatory Visit (INDEPENDENT_AMBULATORY_CARE_PROVIDER_SITE_OTHER): Payer: Medicare Other | Admitting: Urology

## 2020-11-27 ENCOUNTER — Other Ambulatory Visit: Payer: Self-pay

## 2020-11-27 ENCOUNTER — Encounter: Payer: Self-pay | Admitting: Urology

## 2020-11-27 VITALS — BP 115/75 | HR 91

## 2020-11-27 DIAGNOSIS — D751 Secondary polycythemia: Secondary | ICD-10-CM

## 2020-11-27 DIAGNOSIS — N420 Calculus of prostate: Secondary | ICD-10-CM

## 2020-11-27 DIAGNOSIS — E291 Testicular hypofunction: Secondary | ICD-10-CM

## 2020-11-27 DIAGNOSIS — R102 Pelvic and perineal pain: Secondary | ICD-10-CM | POA: Diagnosis not present

## 2020-11-27 LAB — URINALYSIS, ROUTINE W REFLEX MICROSCOPIC
Bilirubin, UA: NEGATIVE
Glucose, UA: NEGATIVE
Ketones, UA: NEGATIVE
Leukocytes,UA: NEGATIVE
Nitrite, UA: NEGATIVE
Protein,UA: NEGATIVE
RBC, UA: NEGATIVE
Specific Gravity, UA: 1.015 (ref 1.005–1.030)
Urobilinogen, Ur: 0.2 mg/dL (ref 0.2–1.0)
pH, UA: 6 (ref 5.0–7.5)

## 2020-11-27 NOTE — Progress Notes (Signed)
Subjective: 1. Hypogonadism in male   2. Secondary polycythemia   3. Pelvic pain in male   4. Stone, prostate     11/27/20: Tim Rogers returns today in f/u.  He reports he is doing well and the discomfort with ejaculation has improved and is minimal.  A renal stone shows some hepatic cysts and prostate stones but no other significant findings.  His IPSS is 0 and his UA is clear.  He remains on TRT with topicals but was interested in testopel.  He does report a prior history of elevated Hemoglobins with TRT.    Gu Hx: Tim Rogers is a 65 yo male who is sent for evaluation of perineal pain.  He got an antibiotic for 5 days and his symptoms recurred.  He got another 7 days but the symptoms recurred.  He has now been on the bactrim for 7 weeks.  He has no further pain with ejaculation but he still can have some suprapubic discomfort.  He has increased symptoms with tea or diet coke.  Beer will also impact it.  He is voiding well with an IPSS of 1.  He has minimal change in his bowel habits.   He has had no documented stones or GU surgery.  He thinks he may have passed a stone 4 weeks ago which he had a sharp burning at the tip of the penis x 1. He had a CT AP in 2006 and there were no stones.  He had UA's on 5/24 and 7/13 that had 0-2 RBC's on one.  He has 0-2 RBC's today.   HIs PSA was 1.1 on 05/14/20.  He is on TRT currently with a topical but he had been getting pellets from [facility].  He takes a lot of Vitamins.  His most recent T is about 1000.   ROS:  ROS  No Known Allergies  Past Medical History:  Diagnosis Date   Acid reflux    Anxiety    2003   Ascending aortic aneurysm (HCC)    GERD (gastroesophageal reflux disease)    Low testosterone    PVCs (premature ventricular contractions)     Past Surgical History:  Procedure Laterality Date   CHOLECYSTECTOMY     ESOPHAGOGASTRODUODENOSCOPY N/A 09/21/2017   Procedure: ESOPHAGOGASTRODUODENOSCOPY (EGD);  Surgeon: Malissa Hippo, MD;  Location:  [facility];  Service: Endoscopy;  Laterality: N/A;  2:55   TIF procedure      Social History   Socioeconomic History   Marital status: Single    Spouse name: Not on file   Number of children: 0   Years of education: 58   Highest education level: Not on file  Occupational History   Occupation: retired  Tobacco Use   Smoking status: Never   Smokeless tobacco: Former    Types: Snuff  Vaping Use   Vaping Use: Never used  Substance and Sexual Activity   Alcohol use: Yes    Alcohol/week: 5.0 standard drinks    Types: 5 Cans of beer per week    Comment: couple of beer sometimes daily   Drug use: No   Sexual activity: Not Currently    Birth control/protection: None  Other Topics Concern   Not on file  Social History Narrative   Retired.Single.Lives alone.   [employer].   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   Social Determinants of Health  Financial Resource Strain: Not on file  Food Insecurity: Not on file  Transportation Needs: Not on file  Physical Activity: Not on file  Stress: Not on file  Social Connections: Not on file  Intimate Partner Violence: Not on file    Family History  Problem Relation Age of Onset   Alcohol abuse Mother    Cancer Mother        lung   COPD Mother    Heart disease Mother    Early death Mother        age 68 multiple factors   Cancer Sister        breast    Anti-infectives: Anti-infectives (From admission, onward)    None       Current Outpatient Medications  Medication Sig Dispense Refill   amLODipine (NORVASC) 10 MG tablet TAKE 1 TABLET(10 MG) BY MOUTH DAILY 90 tablet 1   azelastine (ASTELIN) 0.1 % nasal spray Place 2 sprays into both nostrils daily as needed for allergies. 30 mL 3   Cholecalciferol (VITAMIN D-3) 125 MCG (5000 UT) TABS Take 10,000 Units by mouth daily at 12 noon.     hydrocortisone (ANUSOL-HC) 2.5 % rectal cream Place 1  application rectally 2 (two) times daily. 28 g 2   loratadine (CLARITIN) 10 MG tablet Take 10 mg by mouth daily.     OVER THE COUNTER MEDICATION AG1 - one daily     pantoprazole (PROTONIX) 40 MG tablet Take 1 tablet (40 mg total) by mouth daily. Take 30 min before meals 90 tablet 0   Testosterone 20 % CREA Apply 100 mg topically 2 (two) times daily. 100 g 2   valACYclovir (VALTREX) 1000 MG tablet Take 1,000 mg by mouth 2 (two) times daily.     No current facility-administered medications for this visit.     Objective: Vital signs in last 24 hours: BP 115/75   Pulse 91   Intake/Output from previous day: No intake/output data recorded. Intake/Output this shift: @IOTHISSHIFT @   Physical Exam  Lab Results:  Recent Results (from the past 2160 hour(s))  Urinalysis w microscopic + reflex cultur     Status: Abnormal   Collection Time: 09/10/20 10:47 AM   Specimen: Urine  Result Value Ref Range   Color, Urine DARK YELLOW YELLOW   APPearance CLEAR CLEAR   Specific Gravity, Urine 1.017 1.001 - 1.035   pH 6.0 5.0 - 8.0   Glucose, UA NEGATIVE NEGATIVE   Bilirubin Urine NEGATIVE NEGATIVE    Comment: Verified by repeat analysis. 09/12/20    Ketones, ur 1+ (A) NEGATIVE   Hgb urine dipstick NEGATIVE NEGATIVE   Protein, ur TRACE (A) NEGATIVE   Nitrites, Initial NEGATIVE NEGATIVE   Leukocyte Esterase NEGATIVE NEGATIVE   WBC, UA NONE SEEN 0 - 5 /HPF   RBC / HPF 0-2 0 - 2 /HPF   Squamous Epithelial / LPF NONE SEEN < OR = 5 /HPF   Bacteria, UA NONE SEEN NONE SEEN /HPF   Hyaline Cast NONE SEEN NONE SEEN /LPF  REFLEXIVE URINE CULTURE     Status: None   Collection Time: 09/10/20 10:47 AM  Result Value Ref Range   Reflexve Urine Culture      Comment: NO CULTURE INDICATED  Urinalysis, Routine w reflex microscopic     Status: Abnormal   Collection Time: 10/02/20  2:44 PM  Result Value Ref Range   Specific Gravity, UA 1.010 1.005 - 1.030   pH, UA 6.5 5.0 - 7.5  Color, UA Yellow Yellow    Appearance Ur Clear Clear   Leukocytes,UA Negative Negative   Protein,UA Negative Negative/Trace   Glucose, UA Negative Negative   Ketones, UA Negative Negative   RBC, UA Trace (A) Negative   Bilirubin, UA Negative Negative   Urobilinogen, Ur 0.2 0.2 - 1.0 mg/dL   Nitrite, UA Negative Negative   Microscopic Examination See below:   Microscopic Examination     Status: None   Collection Time: 10/02/20  2:44 PM   Urine  Result Value Ref Range   WBC, UA None seen 0 - 5 /hpf   RBC 0-2 0 - 2 /hpf   Epithelial Cells (non renal) None seen 0 - 10 /hpf   Renal Epithel, UA None seen None seen /hpf   Mucus, UA Present Not Estab.   Bacteria, UA None seen None seen/Few  Urinalysis, Routine w reflex microscopic     Status: None   Collection Time: 11/27/20  2:15 PM  Result Value Ref Range   Specific Gravity, UA 1.015 1.005 - 1.030   pH, UA 6.0 5.0 - 7.5   Color, UA Yellow Yellow   Appearance Ur Clear Clear   Leukocytes,UA Negative Negative   Protein,UA Negative Negative/Trace   Glucose, UA Negative Negative   Ketones, UA Negative Negative   RBC, UA Negative Negative   Bilirubin, UA Negative Negative   Urobilinogen, Ur 0.2 0.2 - 1.0 mg/dL   Nitrite, UA Negative Negative   Microscopic Examination Comment     Comment: Microscopic follows if indicated.   .brief  BMET No results for input(s): NA, K, CL, CO2, GLUCOSE, BUN, CREATININE, CALCIUM in the last 72 hours. PT/INR No results for input(s): LABPROT, INR in the last 72 hours. ABG No results for input(s): PHART, HCO3 in the last 72 hours.  Invalid input(s): PCO2, PO2   Studies/Results: DG Abd 1 View  Result Date: 10/04/2020 CLINICAL DATA:  Pelvic pain EXAM: ABDOMEN - 1 VIEW COMPARISON:  11/02/2003 FINDINGS: Scattered large and small bowel gas is noted. No renal or ureteral calculi are noted. Mild degenerative changes of lumbar spine and hip joints are seen. No other focal abnormality is noted. IMPRESSION: No acute abnormality noted.  Electronically Signed   By: Alcide Clever M.D.   On: 10/04/2020 02:57   CT RENAL STONE STUDY  Result Date: 11/22/2020 CLINICAL DATA:  65 year old male with history of perineal pain and pain with ejaculation for the past 7 weeks. EXAM: CT ABDOMEN AND PELVIS WITHOUT CONTRAST TECHNIQUE: Multidetector CT imaging of the abdomen and pelvis was performed following the standard protocol without IV contrast. COMPARISON:  CT the abdomen and pelvis 10/21/2004. FINDINGS: Lower chest: Unremarkable. Hepatobiliary: Multiple well-defined low-attenuation lesions are scattered throughout the hepatic parenchyma, incompletely characterized on today's non-contrast CT examination, but statistically likely to represent cysts, largest of which measures 4.4 x 3.4 cm in segment 2 of the liver. Status post cholecystectomy. Pancreas: No definite pancreatic mass or peripancreatic fluid collections or inflammatory changes are noted on today's noncontrast CT examination. Spleen: Unremarkable. Adrenals/Urinary Tract: There are no abnormal calcifications within the collecting system of either kidney, along the course of either ureter, or within the lumen of the urinary bladder. No hydroureteronephrosis or perinephric stranding to suggest urinary tract obstruction at this time. The unenhanced appearance of the kidneys is unremarkable bilaterally. Unenhanced appearance of the urinary bladder is unremarkable. Bilateral adrenal glands are normal in appearance. Stomach/Bowel: Unenhanced appearance of the stomach is normal. There is no pathologic dilatation of  small bowel or colon. Normal appendix. Vascular/Lymphatic: Aortic atherosclerosis. No lymphadenopathy noted in the abdomen or pelvis. Reproductive: Coarse calcification in the central aspect of the prostate gland slightly to the right of midline (nonspecific). Prostate gland and seminal vesicles are otherwise unremarkable in appearance. Other: No significant volume of ascites.  No  pneumoperitoneum. Musculoskeletal: There are no aggressive appearing lytic or blastic lesions noted in the visualized portions of the skeleton. IMPRESSION: 1. No acute findings are noted in the abdomen or pelvis to account for the patient's symptoms. Specifically, no urinary tract calculi no findings of urinary tract obstruction. 2. Multiple low-attenuation liver lesions, incompletely characterized on today's non-contrast CT examination, but statistically likely to represent cysts. 3. Aortic atherosclerosis. 4. Additional incidental findings, as above. Electronically Signed   By: Trudie Reed M.D.   On: 11/22/2020 08:41     Assessment/Plan: Dysuria with suprapubic pain.   The symptoms have improved with only mild burning with ejaculation.  This could be from the prostatitic stone but I don't think it would be worthwhile to try to remove it.   Hypogonadism.  He would like to resume testosterone pellet implants.   I will get a T and H&H today and have him return in about a months for a 6 unit testopel implant.  He has requested plain lidocaine as the epinephrine caused a reaction previously.  History of acquired polycythemia.   H&H ordered and will be followed closely on therapy.   No orders of the defined types were placed in this encounter.    Orders Placed This Encounter  Procedures   Urinalysis, Routine w reflex microscopic   Hemoglobin and hematocrit, blood   Testosterone     Return in about 4 weeks (around 12/25/2020) for f/u in 4-5 weeks for a testopel implant, 6 implants. Tim Rogers 11/27/2020 [phone] Patient ID: Tim Rogers, male   DOB: Jul 05, 1955, 65 y.o.   MRN: [id number]

## 2020-11-27 NOTE — Progress Notes (Signed)
Urological Symptom Review  Patient is experiencing the following symptoms: none   Review of Systems  Gastrointestinal (upper)  : Negative for upper GI symptoms  Gastrointestinal (lower) : Negative for lower GI symptoms  Constitutional : Negative for symptoms  Skin: Negative for skin symptoms  Eyes: Negative for eye symptoms  Ear/Nose/Throat : Sinus problems  Hematologic/Lymphatic: Negative for Hematologic/Lymphatic symptoms  Cardiovascular : Negative for cardiovascular symptoms  Respiratory : Negative for respiratory symptoms  Endocrine: Negative for endocrine symptoms  Musculoskeletal: Negative for musculoskeletal symptoms  Neurological: Negative for neurological symptoms  Psychologic: Anxiety

## 2020-11-28 LAB — HEMOGLOBIN AND HEMATOCRIT, BLOOD
Hematocrit: 54.4 % — ABNORMAL HIGH (ref 37.5–51.0)
Hemoglobin: 19.4 g/dL — ABNORMAL HIGH (ref 13.0–17.7)

## 2020-11-28 LAB — TESTOSTERONE: Testosterone: 872 ng/dL (ref 264–916)

## 2020-12-01 ENCOUNTER — Telehealth: Payer: Self-pay

## 2020-12-01 DIAGNOSIS — E291 Testicular hypofunction: Secondary | ICD-10-CM

## 2020-12-01 NOTE — Telephone Encounter (Signed)
Diane with [facility] states patient can come to Outpatient clinic to have therapeutic blood draw.  Message sent to request how to order referral.

## 2020-12-01 NOTE — Telephone Encounter (Signed)
Message left to return call.

## 2020-12-01 NOTE — Telephone Encounter (Signed)
-----   Message from Bjorn Pippin, MD sent at 11/28/2020  2:35 PM EDT ----- His testosterone is 872 and his Hgb is 19.4 which is too high.   He needs to donate blood and I will need a repeat Hgb a week after donation and prior to the testopel implants.   At that level, he may need to give blood regularly every 2 months or so.   ----- Message ----- From: Ferdinand Lango, RN Sent: 11/28/2020   8:53 AM EDT To: Bjorn Pippin, MD  Please review

## 2020-12-02 NOTE — Telephone Encounter (Signed)
Order placed for therapeutic phlebotomy. Carolyn at short stay notified of order placed- they will call to arrange set up for blood donation.  Patient voiced understanding of calling office to schedule CBC lab appointment after his donation to recheck. CBC order placed.

## 2020-12-10 ENCOUNTER — Encounter (HOSPITAL_COMMUNITY)
Admission: RE | Admit: 2020-12-10 | Discharge: 2020-12-10 | Disposition: A | Discharge status: Home / Self Care | Payer: Medicare Other | Source: Ambulatory Visit | Attending: Urology | Admitting: Urology

## 2020-12-10 ENCOUNTER — Encounter (HOSPITAL_COMMUNITY): Payer: Self-pay

## 2020-12-10 ENCOUNTER — Encounter: Payer: Self-pay | Admitting: Cardiology

## 2020-12-10 ENCOUNTER — Ambulatory Visit (INDEPENDENT_AMBULATORY_CARE_PROVIDER_SITE_OTHER): Payer: Medicare Other | Admitting: Cardiology

## 2020-12-10 VITALS — BP 122/78 | HR 82 | Ht 71.0 in | Wt 186.2 lb

## 2020-12-10 DIAGNOSIS — I77819 Aortic ectasia, unspecified site: Secondary | ICD-10-CM

## 2020-12-10 DIAGNOSIS — I10 Essential (primary) hypertension: Secondary | ICD-10-CM

## 2020-12-10 DIAGNOSIS — E291 Testicular hypofunction: Secondary | ICD-10-CM | POA: Insufficient documentation

## 2020-12-10 DIAGNOSIS — I7121 Aneurysm of the ascending aorta, without rupture: Secondary | ICD-10-CM | POA: Diagnosis not present

## 2020-12-10 DIAGNOSIS — T387X5A Adverse effect of androgens and anabolic congeners, initial encounter: Secondary | ICD-10-CM | POA: Insufficient documentation

## 2020-12-10 NOTE — Progress Notes (Signed)
Tim Rogers presents today for phlebotomy per MD orders. HGB/HCT:19.4/54.4 Phlebotomy procedure started at 0821 and ended at 0831. 18.8 fl oz removed. Patient tolerated procedure well. IV needle removed intact.

## 2020-12-10 NOTE — Patient Instructions (Addendum)
Medication Instructions:  Your physician recommends that you continue on your current medications as directed. Please refer to the Current Medication list given to you today.  Labwork: BMET within 6 weeks of CTA chest & aorta  Testing/Procedures: CTA of chest and aorta  Follow-Up: Your physician recommends that you schedule a follow-up appointment in: 1 year. You will receive a reminder call or letter in the mail in about 10 months reminding you to call and schedule your appointment. If you don't receive this letter, please contact our office.  Any Other Special Instructions Will Be Listed Below (If Applicable).  If you need a refill on your cardiac medications before your next appointment, please call your pharmacy.

## 2020-12-10 NOTE — Progress Notes (Signed)
Cardiology Office Note  Date: 12/10/2020   ID: Tim Rogers, DOB [birth year], MRN [id number]  PCP:  Pcp, No  Cardiologist:  Nona Dell, MD Electrophysiologist:  None   Chief Complaint  Patient presents with   Cardiac follow-up     History of Present Illness: Tim Rogers is a 65 y.o. male last seen in May 2021 by Mr. Vincenza Hews NP.  He is here for a routine visit.  He states that he feels well, has been exercising 3 days a week at [employer].  Reports no exertional chest pain or unusual shortness of breath, also enjoys being active outdoors [place].  His last echocardiogram was in January 2021 at which point LVEF was 60 to 65% with mild diastolic dysfunction, stable dilatation of the aortic root at 4.6 cm and ascending aorta at 4.1 cm.  Aortic regurgitation was mild.  We discussed getting a chest CTA for surveillance this year.  I personally reviewed his ECG which shows sinus rhythm with leftward axis.  I went over his medications which are stable, he remains on Norvasc with good blood pressure control today.  Past Medical History:  Diagnosis Date   Acid reflux    Anxiety    2003   Ascending aortic aneurysm    GERD (gastroesophageal reflux disease)    Low testosterone    PVCs (premature ventricular contractions)     Past Surgical History:  Procedure Laterality Date   CHOLECYSTECTOMY     ESOPHAGOGASTRODUODENOSCOPY N/A 09/21/2017   Procedure: ESOPHAGOGASTRODUODENOSCOPY (EGD);  Surgeon: Malissa Hippo, MD;  Location: [facility];  Service: Endoscopy;  Laterality: N/A;  2:55   TIF procedure      Current Outpatient Medications  Medication Sig Dispense Refill   amLODipine (NORVASC) 10 MG tablet TAKE 1 TABLET(10 MG) BY MOUTH DAILY 90 tablet 1   azelastine (ASTELIN) 0.1 % nasal spray Place 2 sprays into both nostrils daily as needed for allergies. 30 mL 3   Cholecalciferol (VITAMIN D-3) 125 MCG (5000 UT) TABS Take 10,000 Units by mouth daily at 12 noon.      hydrocortisone (ANUSOL-HC) 2.5 % rectal cream Place 1 application rectally 2 (two) times daily. 28 g 2   loratadine (CLARITIN) 10 MG tablet Take 10 mg by mouth daily.     OVER THE COUNTER MEDICATION AG1 - one daily     pantoprazole (PROTONIX) 40 MG tablet Take 1 tablet (40 mg total) by mouth daily. Take 30 min before meals 90 tablet 0   Testosterone 20 % CREA Apply 100 mg topically 2 (two) times daily. 100 g 2   valACYclovir (VALTREX) 1000 MG tablet Take 1,000 mg by mouth 2 (two) times daily.     No current facility-administered medications for this visit.   Allergies:  Patient has no known allergies.   ROS: No orthopnea or PND.  No syncope.  Physical Exam: VS:  BP 122/78   Pulse 82   Ht 5\' 11"  (1.803 m)   Wt 186 lb 3.2 oz (84.5 kg)   SpO2 97%   BMI 25.97 kg/m , BMI Body mass index is 25.97 kg/m.  Wt Readings from Last 3 Encounters:  12/10/20 186 lb 3.2 oz (84.5 kg)  12/10/20 183 lb (83 kg)  11/20/20 183 lb (83 kg)    General: Patient appears comfortable at rest. HEENT: Conjunctiva and lids normal, wearing a mask. Neck: Supple, no elevated JVP or carotid bruits, no thyromegaly. Lungs: Clear to auscultation, nonlabored breathing at  rest. Cardiac: Regular rate and rhythm, no S3 or significant systolic murmur, no pericardial rub. Extremities: No pitting edema.  ECG:  An ECG dated 07/03/2019 was personally reviewed today and demonstrated:  Sinus rhythm with nonspecific ST changes and occasional fusion beats  Recent Labwork: 05/13/2020: ALT 31; AST 32; BUN 19; Creat 1.09; Potassium 4.5; Sodium 138 11/27/2020: Hemoglobin 19.4   Other Studies Reviewed Today:  Echocardiogram 03/14/2019:  1. Left ventricular ejection fraction, by visual estimation, is 60 to  65%. The left ventricle has normal function. There is no left ventricular  hypertrophy.   2. Elevated left ventricular end-diastolic pressure.   3. Left ventricular diastolic parameters are consistent with Grade I   diastolic dysfunction (impaired relaxation).   4. The left ventricle has no regional wall motion abnormalities.   5. Global right ventricle has normal systolic function.The right  ventricular size is normal. No increase in right ventricular wall  thickness.   6. Left atrial size was normal.   7. Right atrial size was normal.   8. The mitral valve is grossly normal. Trivial mitral valve  regurgitation.   9. The tricuspid valve is not well visualized.  10. The aortic valve was not well visualized. Aortic valve regurgitation  is mild. No evidence of aortic valve sclerosis or stenosis.  11. The pulmonic valve was not well visualized. Pulmonic valve  regurgitation is not visualized.  12. Aortic dilatation noted.  13. Aortic root 4.6 cm in diameter. Ascending aorta 4.1 cm in diameter.  14. Normal pulmonary artery systolic pressure.  15. The interatrial septum was not well visualized.   Assessment and Plan:  1.  Aortic dilatation involving root and ascending aorta measuring 4.6 and 4.1 cm respectively by echocardiogram last year.  He is asymptomatic and blood pressure is well controlled today.  Plan to obtain a chest CTA for surveillance this year and otherwise continue with observation.  2.  Mild aortic regurgitation by echocardiogram last year.  No significant change on examination today.  Continue to follow.  3.  Essential hypertension, on Norvasc.  Blood pressure is well controlled today.  Medication Adjustments/Labs and Tests Ordered: Current medicines are reviewed at length with the patient today.  Concerns regarding medicines are outlined above.   Tests Ordered: Orders Placed This Encounter  Procedures   CT ANGIO CHEST AORTA W/CM & OR WO/CM   Basic metabolic panel   EKG 12-Lead     Medication Changes: No orders of the defined types were placed in this encounter.   Disposition:  Follow up  1 year.  Signed, Jonelle Sidle, MD, [facility] 12/10/2020 2:32 PM    [name] [address] [place], [place], [place] [address] Phone: [phone]; Fax: [phone]

## 2020-12-18 ENCOUNTER — Other Ambulatory Visit: Payer: Self-pay

## 2020-12-18 ENCOUNTER — Other Ambulatory Visit: Payer: Medicare Other

## 2020-12-19 LAB — CBC
Hematocrit: 53.1 % — ABNORMAL HIGH (ref 37.5–51.0)
Hemoglobin: 19 g/dL — ABNORMAL HIGH (ref 13.0–17.7)
MCH: 31.6 pg (ref 26.6–33.0)
MCHC: 35.8 g/dL — ABNORMAL HIGH (ref 31.5–35.7)
MCV: 88 fL (ref 79–97)
Platelets: 230 10*3/uL (ref 150–450)
RBC: 6.01 x10E6/uL — ABNORMAL HIGH (ref 4.14–5.80)
RDW: 12.9 % (ref 11.6–15.4)
WBC: 7.9 10*3/uL (ref 3.4–10.8)

## 2020-12-19 LAB — BASIC METABOLIC PANEL
BUN/Creatinine Ratio: 12 (ref 10–24)
BUN: 14 mg/dL (ref 8–27)
CO2: 24 mmol/L (ref 20–29)
Calcium: 9.6 mg/dL (ref 8.6–10.2)
Chloride: 100 mmol/L (ref 96–106)
Creatinine, Ser: 1.15 mg/dL (ref 0.76–1.27)
Glucose: 98 mg/dL (ref 70–99)
Potassium: 4.1 mmol/L (ref 3.5–5.2)
Sodium: 140 mmol/L (ref 134–144)
eGFR: 70 mL/min/{1.73_m2} (ref >59)

## 2020-12-22 ENCOUNTER — Telehealth: Payer: Self-pay

## 2020-12-22 DIAGNOSIS — E291 Testicular hypofunction: Secondary | ICD-10-CM

## 2020-12-22 NOTE — Telephone Encounter (Signed)
-----   Message from Bjorn Pippin, MD sent at 12/21/2020  8:37 AM EDT ----- If has donated blood, he needs to donate again as his Hgb is still higher than I would like to see it and if hasn't donated, he needs to.  ----- Message ----- From: Gustavus Messing, LPN Sent: [id number]   8:06 AM EDT To: Bjorn Pippin, MD  Please review

## 2020-12-22 NOTE — Telephone Encounter (Signed)
Patient called and made aware. Orders placed. Tim Rogers the OR scheduler made aware and will call and schedule patient.

## 2020-12-23 ENCOUNTER — Telehealth: Payer: Self-pay

## 2020-12-23 NOTE — Telephone Encounter (Signed)
Pt needing a call back regarding labs before [facility] visit.  Call back: [phone]  Thanks, Rosey Bath

## 2020-12-23 NOTE — Telephone Encounter (Signed)
Patient called and appointment made

## 2020-12-30 ENCOUNTER — Other Ambulatory Visit: Payer: Self-pay

## 2020-12-30 ENCOUNTER — Encounter (HOSPITAL_COMMUNITY): Payer: Self-pay

## 2020-12-30 ENCOUNTER — Encounter (HOSPITAL_COMMUNITY)
Admission: RE | Admit: 2020-12-30 | Discharge: 2020-12-30 | Disposition: A | Discharge status: Home / Self Care | Payer: Medicare Other | Source: Ambulatory Visit | Attending: Urology | Admitting: Urology

## 2020-12-30 DIAGNOSIS — E291 Testicular hypofunction: Secondary | ICD-10-CM | POA: Diagnosis not present

## 2020-12-30 NOTE — Progress Notes (Signed)
Tim Rogers presents today for phlebotomy per MD orders. HGB/HCT: 19.0/53.1 Phlebotomy procedure started at 1004 and ended at 1015 20.5 ounces removed. Patient tolerated procedure well. IV needle removed intact.

## 2021-01-01 ENCOUNTER — Ambulatory Visit: Payer: Medicare Other | Admitting: Urology

## 2021-01-06 ENCOUNTER — Other Ambulatory Visit: Payer: Self-pay

## 2021-01-06 ENCOUNTER — Other Ambulatory Visit: Payer: Medicare Other

## 2021-01-06 DIAGNOSIS — E291 Testicular hypofunction: Secondary | ICD-10-CM

## 2021-01-07 LAB — CBC WITH DIFFERENTIAL/PLATELET
Basophils Absolute: 0.1 10*3/uL (ref 0.0–0.2)
Basos: 1 %
EOS (ABSOLUTE): 0.4 10*3/uL (ref 0.0–0.4)
Eos: 5 %
Hematocrit: 48.1 % (ref 37.5–51.0)
Hemoglobin: 16.6 g/dL (ref 13.0–17.7)
Immature Grans (Abs): 0 10*3/uL (ref 0.0–0.1)
Immature Granulocytes: 0 %
Lymphocytes Absolute: 2.4 10*3/uL (ref 0.7–3.1)
Lymphs: 33 %
MCH: 30.2 pg (ref 26.6–33.0)
MCHC: 34.5 g/dL (ref 31.5–35.7)
MCV: 88 fL (ref 79–97)
Monocytes Absolute: 0.7 10*3/uL (ref 0.1–0.9)
Monocytes: 9 %
Neutrophils Absolute: 3.9 10*3/uL (ref 1.4–7.0)
Neutrophils: 52 %
Platelets: 253 10*3/uL (ref 150–450)
RBC: 5.5 x10E6/uL (ref 4.14–5.80)
RDW: 12.2 % (ref 11.6–15.4)
WBC: 7.4 10*3/uL (ref 3.4–10.8)

## 2021-01-08 ENCOUNTER — Ambulatory Visit (HOSPITAL_COMMUNITY)
Admission: RE | Admit: 2021-01-08 | Discharge: 2021-01-08 | Disposition: A | Discharge status: Home / Self Care | Payer: Medicare Other | Source: Ambulatory Visit | Attending: Cardiology | Admitting: Cardiology

## 2021-01-08 ENCOUNTER — Other Ambulatory Visit: Payer: Self-pay

## 2021-01-08 ENCOUNTER — Telehealth: Payer: Self-pay | Admitting: *Deleted

## 2021-01-08 DIAGNOSIS — I7121 Aneurysm of the ascending aorta, without rupture: Secondary | ICD-10-CM | POA: Insufficient documentation

## 2021-01-08 MED ORDER — IOHEXOL 350 MG/ML SOLN
100.0000 mL | Freq: Once | INTRAVENOUS | Status: AC | PRN
  Administered 2021-01-08: 75 mL via INTRAVENOUS

## 2021-01-08 NOTE — Progress Notes (Signed)
Sent via mychart

## 2021-01-08 NOTE — Telephone Encounter (Signed)
Patient informed. 

## 2021-01-08 NOTE — Telephone Encounter (Signed)
-----   Message from Jonelle Sidle, MD sent at 01/08/2021 10:05 AM EST ----- Results reviewed.  Ascending thoracic aortic aneurysm as noted by prior echocardiography.  Measurement is 46 mm at the aortic root and 44 mm in the ascending aorta.  Continue with annual imaging, most likely plan on a follow-up chest CTA in 1 year.

## 2021-01-20 ENCOUNTER — Ambulatory Visit (INDEPENDENT_AMBULATORY_CARE_PROVIDER_SITE_OTHER): Payer: Medicare Other | Admitting: Internal Medicine

## 2021-02-03 ENCOUNTER — Other Ambulatory Visit (INDEPENDENT_AMBULATORY_CARE_PROVIDER_SITE_OTHER): Payer: Self-pay | Admitting: Gastroenterology

## 2021-02-03 DIAGNOSIS — K219 Gastro-esophageal reflux disease without esophagitis: Secondary | ICD-10-CM

## 2021-02-04 ENCOUNTER — Other Ambulatory Visit (INDEPENDENT_AMBULATORY_CARE_PROVIDER_SITE_OTHER): Payer: Self-pay | Admitting: Internal Medicine

## 2021-02-04 DIAGNOSIS — K219 Gastro-esophageal reflux disease without esophagitis: Secondary | ICD-10-CM

## 2021-02-08 ENCOUNTER — Other Ambulatory Visit (INDEPENDENT_AMBULATORY_CARE_PROVIDER_SITE_OTHER): Payer: Self-pay | Admitting: Gastroenterology

## 2021-02-08 DIAGNOSIS — K219 Gastro-esophageal reflux disease without esophagitis: Secondary | ICD-10-CM

## 2021-02-11 ENCOUNTER — Other Ambulatory Visit (INDEPENDENT_AMBULATORY_CARE_PROVIDER_SITE_OTHER): Payer: Self-pay | Admitting: *Deleted

## 2021-02-11 ENCOUNTER — Telehealth (INDEPENDENT_AMBULATORY_CARE_PROVIDER_SITE_OTHER): Payer: Self-pay | Admitting: *Deleted

## 2021-02-11 DIAGNOSIS — K219 Gastro-esophageal reflux disease without esophagitis: Secondary | ICD-10-CM

## 2021-02-11 MED ORDER — PANTOPRAZOLE SODIUM 40 MG PO TBEC
DELAYED_RELEASE_TABLET | ORAL | 1 refills | Status: DC
Start: 2021-02-11 — End: 2021-08-04

## 2021-02-11 NOTE — Telephone Encounter (Signed)
Pharmacy sent refills and we denied, patient wants to know why  Ph# [phone]

## 2021-02-11 NOTE — Telephone Encounter (Signed)
Pt seen in September 2022 and note states to continue pantoprazole 40mg  daily and follow up in one year. #90 with one additional refill sent in per protocol. Pt called and notified that rx denied was the one sent on 02/03/21 for 20mg  since he is one 40mg . I called pharm and discussed with pharmacist as well at [facility] [address].

## 2021-02-19 ENCOUNTER — Other Ambulatory Visit: Payer: Self-pay

## 2021-02-19 ENCOUNTER — Ambulatory Visit (INDEPENDENT_AMBULATORY_CARE_PROVIDER_SITE_OTHER): Payer: Medicare Other | Admitting: Urology

## 2021-02-19 ENCOUNTER — Encounter: Payer: Self-pay | Admitting: Urology

## 2021-02-19 VITALS — BP 108/73 | HR 101

## 2021-02-19 DIAGNOSIS — D751 Secondary polycythemia: Secondary | ICD-10-CM | POA: Diagnosis not present

## 2021-02-19 DIAGNOSIS — E291 Testicular hypofunction: Secondary | ICD-10-CM | POA: Diagnosis not present

## 2021-02-19 NOTE — Progress Notes (Signed)
Subjective: 1. Hypogonadism in male   2. Secondary polycythemia     02/19/21: Tim Rogers returns today in f/u for a testopel implant.  His Hgb is down to normal after two phlebotomies.    11/27/20: Tim Rogers returns today in f/u.  He reports he is doing well and the discomfort with ejaculation has improved and is minimal.  A renal stone shows some hepatic cysts and prostate stones but no other significant findings.  His IPSS is 0 and his UA is clear.  He remains on TRT with topicals but was interested in testopel.  He does report a prior history of elevated Hemoglobins with TRT.    Gu Hx: Tim Rogers is a 65 yo male who is sent for evaluation of perineal pain.  He got an antibiotic for 5 days and his symptoms recurred.  He got another 7 days but the symptoms recurred.  He has now been on the bactrim for 7 weeks.  He has no further pain with ejaculation but he still can have some suprapubic discomfort.  He has increased symptoms with tea or diet coke.  Beer will also impact it.  He is voiding well with an IPSS of 1.  He has minimal change in his bowel habits.   He has had no documented stones or GU surgery.  He thinks he may have passed a stone 4 weeks ago which he had a sharp burning at the tip of the penis x 1. He had a CT AP in 2006 and there were no stones.  He had UA's on 5/24 and 7/13 that had 0-2 RBC's on one.  He has 0-2 RBC's today.   HIs PSA was 1.1 on 05/14/20.  He is on TRT currently with a topical but he had been getting pellets from [facility].  He takes a lot of Vitamins.  His most recent T is about 1000.   ROS:  ROS  No Known Allergies  Past Medical History:  Diagnosis Date   Acid reflux    Anxiety    2003   Ascending aortic aneurysm    GERD (gastroesophageal reflux disease)    Low testosterone    PVCs (premature ventricular contractions)     Past Surgical History:  Procedure Laterality Date   CHOLECYSTECTOMY     ESOPHAGOGASTRODUODENOSCOPY N/A 09/21/2017   Procedure:  ESOPHAGOGASTRODUODENOSCOPY (EGD);  Surgeon: Rogene Houston, MD;  [facility];  Service: Endoscopy;  Laterality: N/A;  2:55   TIF procedure      Social History   Socioeconomic History   Marital status: Single    Spouse name: Not on file   Number of children: 0   Years of education: 47   Highest education level: Not on file  Occupational History   Occupation: retired  Tobacco Use   Smoking status: Never   Smokeless tobacco: Former    Types: Snuff  Vaping Use   Vaping Use: Never used  Substance and Sexual Activity   Alcohol use: Yes    Alcohol/week: 5.0 standard drinks    Types: 5 Cans of beer per week    Comment: couple of beer sometimes daily   Drug use: No   Sexual activity: Not Currently    Birth control/protection: None  Other Topics Concern   Not on file  Social History Narrative   Retired.Single.Lives alone.   Ex-[employer].   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   Social Determinants of Radio broadcast assistant Strain: Not on file  Food Insecurity: Not on file  Transportation Needs: Not on file  Physical Activity: Not on file  Stress: Not on file  Social Connections: Not on file  Intimate Partner Violence: Not on file    Family History  Problem Relation Age of Onset   Alcohol abuse Mother    Cancer Mother        lung   COPD Mother    Heart disease Mother    Early death Mother        age 63 multiple factors   Cancer Sister        breast    Anti-infectives: Anti-infectives (From admission, onward)    None       Current Outpatient Medications  Medication Sig Dispense Refill   amLODipine (NORVASC) 10 MG tablet TAKE 1 TABLET(10 MG) BY MOUTH DAILY 90 tablet 1   azelastine (ASTELIN) 0.1 % nasal spray Place 2 sprays into both nostrils daily as needed for allergies. 30 mL 3   Cholecalciferol (VITAMIN D-3) 125 MCG (5000 UT) TABS Take 10,000 Units by mouth  daily at 12 noon.     hydrocortisone (ANUSOL-HC) 2.5 % rectal cream Place 1 application rectally 2 (two) times daily. 28 g 2   loratadine (CLARITIN) 10 MG tablet Take 10 mg by mouth daily.     OVER THE COUNTER MEDICATION AG1 - one daily     pantoprazole (PROTONIX) 40 MG tablet TAKE 1 TABLET BY MOUTH DAILY. TAKE 30 MINUTES BEFORE MEALS 90 tablet 1   valACYclovir (VALTREX) 1000 MG tablet Take 1,000 mg by mouth 2 (two) times daily.     No current facility-administered medications for this visit.     Objective: Vital signs in last 24 hours: BP 108/73    Pulse (!) 101   Intake/Output from previous day: No intake/output data recorded. Intake/Output this shift: _0 @   Physical Exam  Lab Results:  Recent Results (from the past 2160 hour(s))  Urinalysis, Routine w reflex microscopic     Status: None   Collection Time: 11/27/20  2:15 PM  Result Value Ref Range   Specific Gravity, UA 1.015 1.005 - 1.030   pH, UA 6.0 5.0 - 7.5   Color, UA Yellow Yellow   Appearance Ur Clear Clear   Leukocytes,UA Negative Negative   Protein,UA Negative Negative/Trace   Glucose, UA Negative Negative   Ketones, UA Negative Negative   RBC, UA Negative Negative   Bilirubin, UA Negative Negative   Urobilinogen, Ur 0.2 0.2 - 1.0 mg/dL   Nitrite, UA Negative Negative   Microscopic Examination Comment     Comment: Microscopic follows if indicated.  Hemoglobin and hematocrit, blood     Status: Abnormal   Collection Time: 11/27/20  2:50 PM  Result Value Ref Range   Hemoglobin 19.4 (H) 13.0 - 17.7 g/dL   Hematocrit 54.4 (H) 37.5 - 51.0 %  Testosterone     Status: None   Collection Time: 11/27/20  2:50 PM  Result Value Ref Range   Testosterone 872 264 - 916 ng/dL    Comment: Adult male reference interval is based on a population of healthy nonobese males (BMI <30) between 4 and 68 years old. [place], [place] [phone]. PMID: [id number].   Basic metabolic panel     Status: None    Collection Time: 12/18/20  9:22 AM  Result Value Ref Range   Glucose 98  70 - 99 mg/dL   BUN 14 8 - 27 mg/dL   Creatinine, Ser 1.15 0.76 - 1.27 mg/dL   eGFR 70 >59 mL/min/1.73   BUN/Creatinine Ratio 12 10 - 24   Sodium 140 134 - 144 mmol/L   Potassium 4.1 3.5 - 5.2 mmol/L   Chloride 100 96 - 106 mmol/L   CO2 24 20 - 29 mmol/L   Calcium 9.6 8.6 - 10.2 mg/dL  CBC     Status: Abnormal   Collection Time: 12/18/20  1:25 PM  Result Value Ref Range   WBC 7.9 3.4 - 10.8 x10E3/uL   RBC 6.01 (H) 4.14 - 5.80 x10E6/uL   Hemoglobin 19.0 (H) 13.0 - 17.7 g/dL   Hematocrit 53.1 (H) 37.5 - 51.0 %   MCV 88 79 - 97 fL   MCH 31.6 26.6 - 33.0 pg   MCHC 35.8 (H) 31.5 - 35.7 g/dL   RDW 12.9 11.6 - 15.4 %   Platelets 230 150 - 450 x10E3/uL  CBC with Differential/Platelet     Status: None   Collection Time: 01/06/21 11:02 AM  Result Value Ref Range   WBC 7.4 3.4 - 10.8 x10E3/uL   RBC 5.50 4.14 - 5.80 x10E6/uL   Hemoglobin 16.6 13.0 - 17.7 g/dL   Hematocrit 48.1 37.5 - 51.0 %   MCV 88 79 - 97 fL   MCH 30.2 26.6 - 33.0 pg   MCHC 34.5 31.5 - 35.7 g/dL   RDW 12.2 11.6 - 15.4 %   Platelets 253 150 - 450 x10E3/uL   Neutrophils 52 Not Estab. %   Lymphs 33 Not Estab. %   Monocytes 9 Not Estab. %   Eos 5 Not Estab. %   Basos 1 Not Estab. %   Neutrophils Absolute 3.9 1.4 - 7.0 x10E3/uL   Lymphocytes Absolute 2.4 0.7 - 3.1 x10E3/uL   Monocytes Absolute 0.7 0.1 - 0.9 x10E3/uL   EOS (ABSOLUTE) 0.4 0.0 - 0.4 x10E3/uL   Basophils Absolute 0.1 0.0 - 0.2 x10E3/uL   Immature Granulocytes 0 Not Estab. %   Immature Grans (Abs) 0.0 0.0 - 0.1 x10E3/uL   .brief  BMET No results for input(s): NA, K, CL, CO2, GLUCOSE, BUN, CREATININE, CALCIUM in the last 72 hours. PT/INR No results for input(s): LABPROT, INR in the last 72 hours. ABG No results for input(s): PHART, HCO3 in the last 72 hours.  Invalid input(s): PCO2, PO2   Studies/Results: CT ANGIO CHEST AORTA W/CM & OR WO/CM  Result Date:  01/08/2021 CLINICAL DATA:  65 year old male with history of ascending thoracic aortic aneurysm. Follow-up study. EXAM: CT ANGIOGRAPHY CHEST WITH CONTRAST TECHNIQUE: Multidetector CT imaging of the chest was performed using the standard protocol during bolus administration of intravenous contrast. Multiplanar CT image reconstructions and MIPs were obtained to evaluate the vascular anatomy. CONTRAST:  Seventy-five mL Omnipaque 350, intravenous COMPARISON:  None available. FINDINGS: Cardiovascular: Preferential opacification of the thoracic aorta. No evidence of thoracic aortic aneurysm or dissection. Normal heart size. No pericardial effusion. Sinues of Valsalva: 46 mm 44 x 39 mm Sinotubular Junction: 38 mm Ascending Aorta: 44 mm Aortic Arch: 34 mm Descending aorta: 31 mm at the level of the carina Branch vessels: Conventional branching pattern. No significant atherosclerotic changes. Coronary arteries: Normal origins and courses. Mild atherosclerotic calcifications. Main pulmonary artery: 28 mm. No evidence of central pulmonary embolism. Pulmonary veins: No anomalous pulmonary venous return. No evidence of left atrial appendage thrombus. Upper abdominal vasculature: Within normal limits. Mediastinum/Nodes: No enlarged mediastinal,  hilar, or axillary lymph nodes. Thyroid gland, trachea, and esophagus demonstrate no significant findings. Lungs/Pleura: No focal consolidations. No suspicious pulmonary nodules. No pleural effusion or pneumothorax. Upper Abdomen: Multiple scattered well-circumscribed fluid density lesions throughout the hepatic parenchyma, the largest in the left lobe measures 2 4.3 cm. The liver is otherwise normal in appearance and attenuation without enhancing lesion. The gallbladder is surgically absent. The remaining visualized upper abdomen is within normal limits. Musculoskeletal: No chest wall abnormality. No acute or significant osseous findings. Review of the MIP images confirms the above  findings. IMPRESSION: Vascular: Fusiform aneurysm of the ascending thoracic aorta measuring up to 4.4 cm. Aneurysmal change of the sinuses of Valsalva, measuring up to 4.6 cm. Recommend annual imaging followup by CTA or MRA. This recommendation follows 2010 ACCF/AHA/AATS/ACR/ASA/SCA/SCAI/SIR/STS/SVM Guidelines for the Diagnosis and Management of Patients with Thoracic Aortic Disease. Circulation. 2010; 121: Q762-U633. Aortic aneurysm NOS (ICD10-I71.9) Non-Vascular: 1. No acute intrathoracic abnormality. 2. Multifocal simple hepatic cysts. Ruthann Cancer, MD [facility] Electronically Signed   By: Ruthann Cancer M.D.   On: 01/08/2021 09:23     Procedure:  Testopel implant.  The right upper buttock was prepped with betadine and draped with sterile towels.  The insertion site was chosen and then infiltrated with 58m of 2% lidocaine without epinepherine.  A small stab wound was made with the 15 blade and the insertion trocar was the passed into the subcutaneous fat.  6 Testopel pellets were then deployed one at a time.   The trocar was removed and then tincture of benzoine was applied adjacent to the wound which was then was closed with overlapping 1/2 inch steristrips.  A dressing of 4x4's and hypofix was applied and he was then rolled supine where he lay for 5 minutes to apply pressure to the wound.   There were no complications.   Assessment/Plan:  Hypogonadism.  He will return in 3 months with labs for his next implant.   History of acquired polycythemia.   H&H was down to 16.6 s/p phlebotomy.  No orders of the defined types were placed in this encounter.    Orders Placed This Encounter  Procedures   Testosterone    Standing Status:   Future    Standing Expiration Date:   08/20/2021   Hemoglobin and hematocrit, blood    Standing Status:   Future    Standing Expiration Date:   08/20/2021     No follow-ups on file.        JIrine Seal12/22/2022 [phone] ID: JDelbert Rogers male   DOB: [birth year] 65y.o.   MRN: [id number]

## 2021-04-30 ENCOUNTER — Other Ambulatory Visit: Payer: Medicare Other

## 2021-04-30 ENCOUNTER — Other Ambulatory Visit: Payer: Self-pay

## 2021-04-30 DIAGNOSIS — D751 Secondary polycythemia: Secondary | ICD-10-CM

## 2021-04-30 DIAGNOSIS — E291 Testicular hypofunction: Secondary | ICD-10-CM

## 2021-05-01 ENCOUNTER — Other Ambulatory Visit: Payer: Self-pay | Admitting: Cardiology

## 2021-05-01 LAB — TESTOSTERONE: Testosterone: 628 ng/dL (ref 264–916)

## 2021-05-01 LAB — HEMOGLOBIN AND HEMATOCRIT, BLOOD
Hematocrit: 51.6 % — ABNORMAL HIGH (ref 37.5–51.0)
Hemoglobin: 17.7 g/dL (ref 13.0–17.7)

## 2021-05-01 IMAGING — DX DG HIP (WITH OR WITHOUT PELVIS) 2-3V*L*
3 series · 3 of 3 positions shown · non-contrast
Comparison: None.

CLINICAL DATA: Fall, left hip pain

EXAM:
DG HIP (WITH OR WITHOUT PELVIS) 2-3V LEFT

[hip ap]
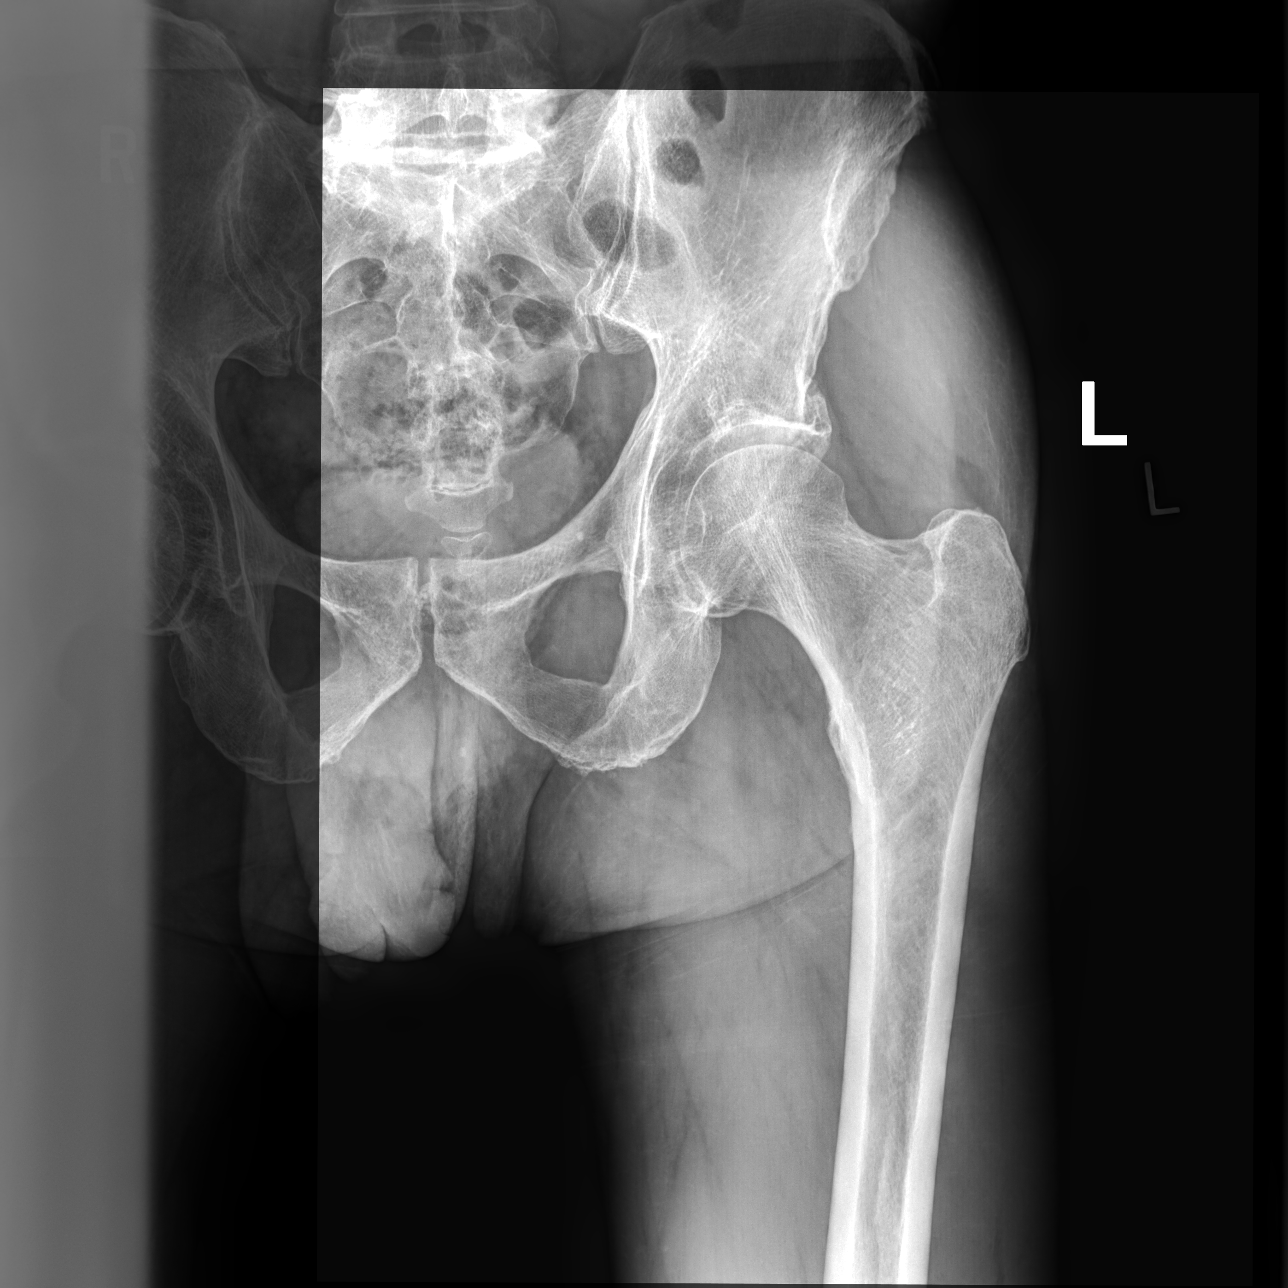

[hip (frog leg)]
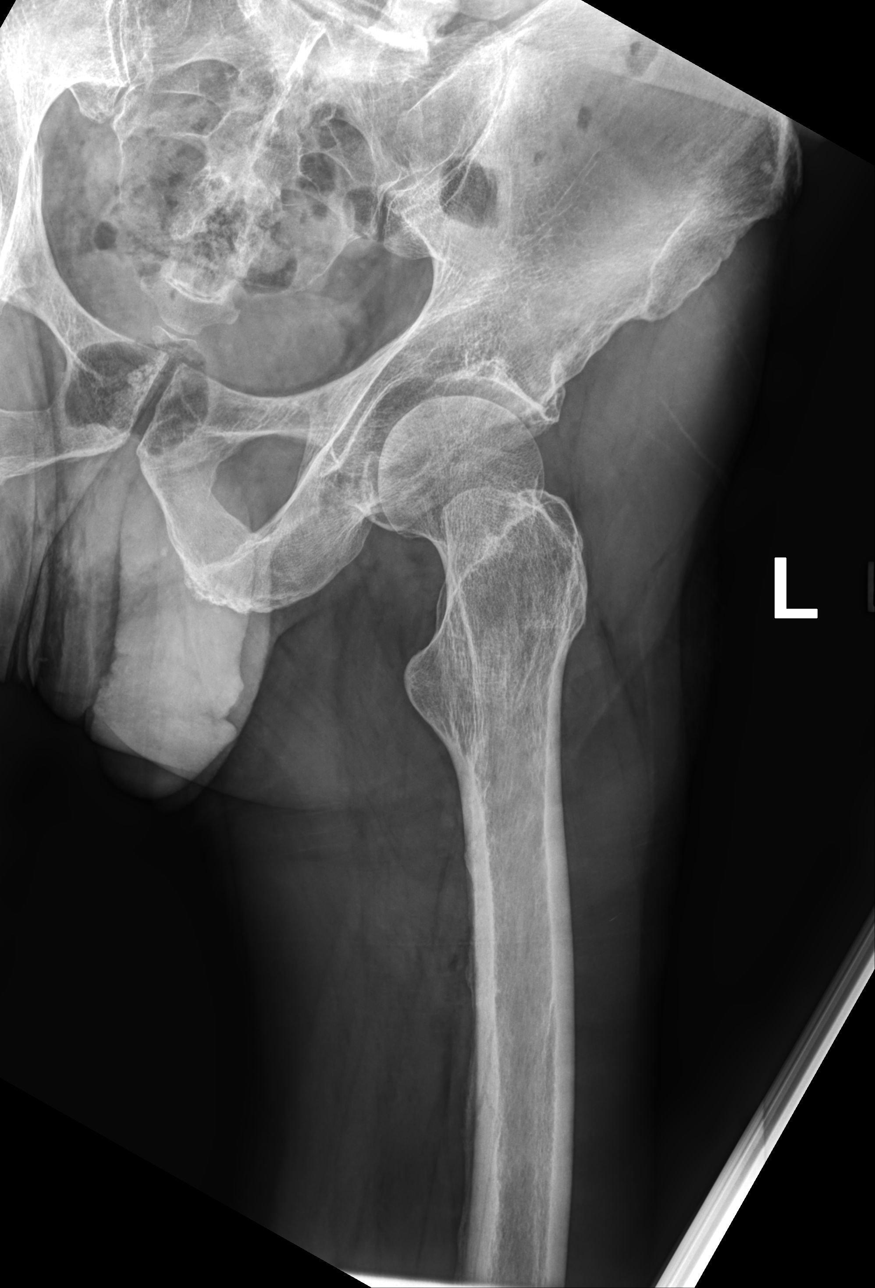

[pelvis ap]
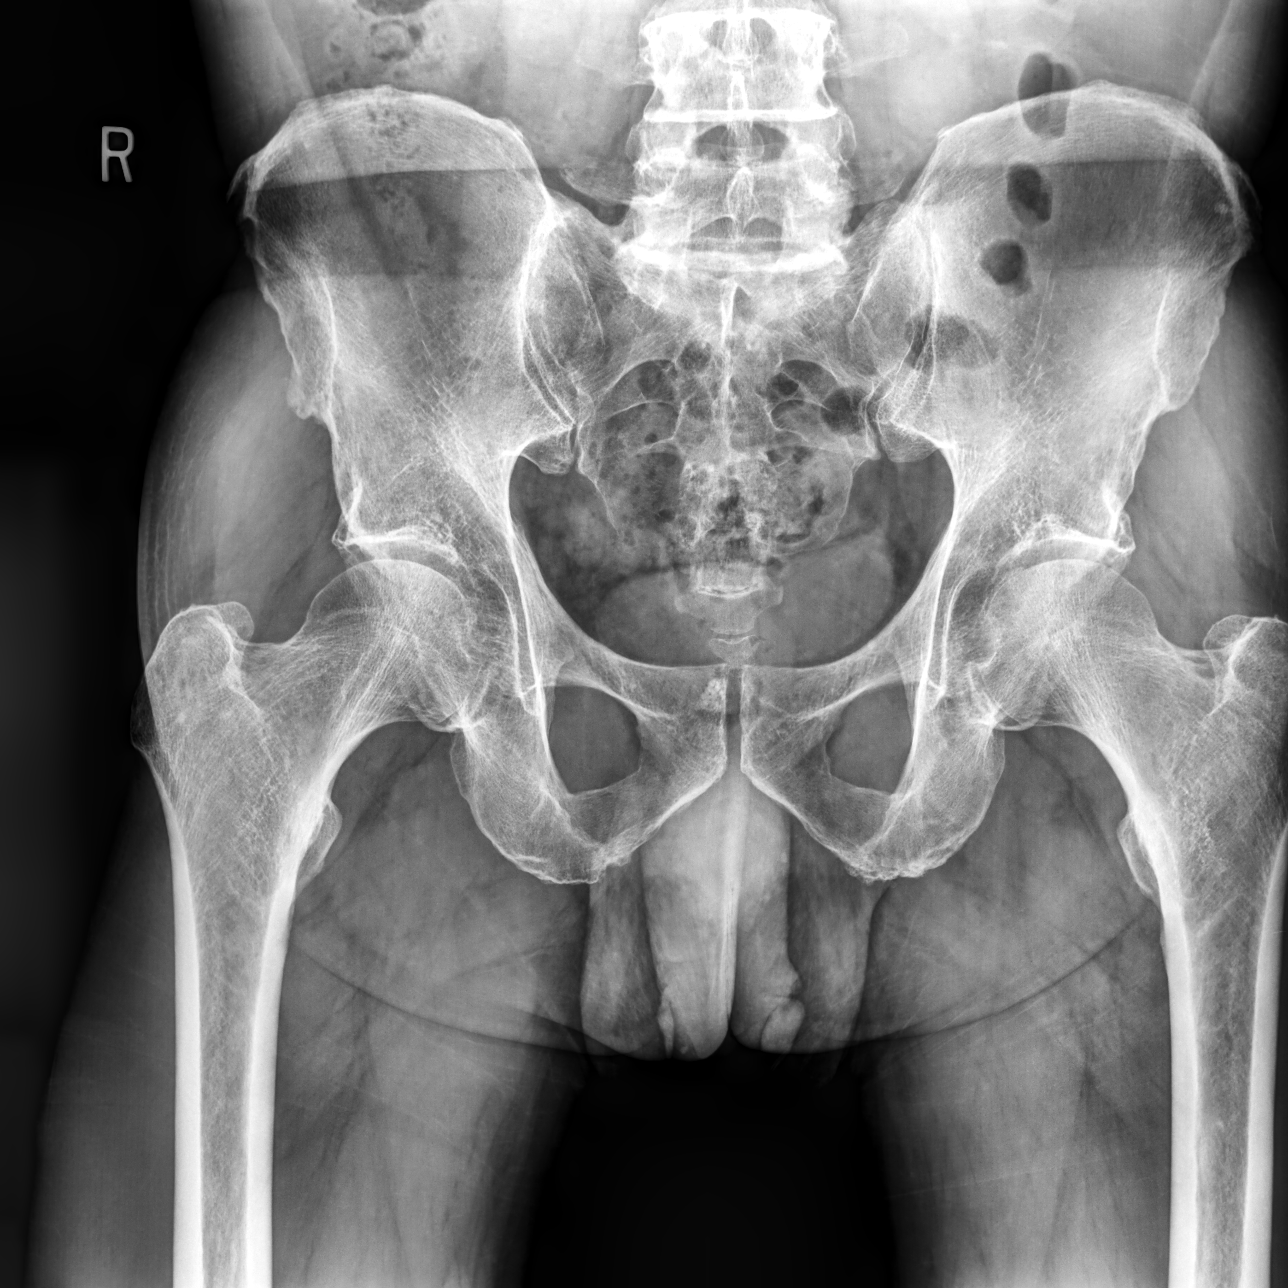

[3 of 3 positions shown; findings below may reference images not displayed]

FINDINGS: No fracture or dislocation is seen.

The joint spaces are preserved.

Visualized bony pelvis appears intact.

Visualized soft tissues are within normal limits.
IMPRESSION: Negative.

## 2021-05-14 ENCOUNTER — Encounter: Payer: Self-pay | Admitting: Urology

## 2021-05-14 ENCOUNTER — Other Ambulatory Visit: Payer: Self-pay

## 2021-05-14 ENCOUNTER — Ambulatory Visit (INDEPENDENT_AMBULATORY_CARE_PROVIDER_SITE_OTHER): Payer: Medicare Other | Admitting: Urology

## 2021-05-14 VITALS — BP 103/68 | HR 77

## 2021-05-14 DIAGNOSIS — D751 Secondary polycythemia: Secondary | ICD-10-CM

## 2021-05-14 DIAGNOSIS — E291 Testicular hypofunction: Secondary | ICD-10-CM | POA: Diagnosis not present

## 2021-05-14 LAB — URINALYSIS, ROUTINE W REFLEX MICROSCOPIC
Bilirubin, UA: NEGATIVE
Glucose, UA: NEGATIVE
Ketones, UA: NEGATIVE
Leukocytes,UA: NEGATIVE
Nitrite, UA: NEGATIVE
Protein,UA: NEGATIVE
Specific Gravity, UA: 1.01 (ref 1.005–1.030)
Urobilinogen, Ur: 0.2 mg/dL (ref 0.2–1.0)
pH, UA: 5.5 (ref 5.0–7.5)

## 2021-05-14 LAB — MICROSCOPIC EXAMINATION
Bacteria, UA: NONE SEEN
Epithelial Cells (non renal): NONE SEEN /hpf (ref 0–10)
RBC, Urine: NONE SEEN /hpf (ref 0–2)
Renal Epithel, UA: NONE SEEN /hpf
WBC, UA: NONE SEEN /hpf (ref 0–5)

## 2021-05-14 NOTE — Progress Notes (Signed)
?Subjective: ?1. Hypogonadism in male   ?2. Secondary polycythemia   ?  ?05/14/21: Tim Rogers returns today in f/u for his history of hypogonadism and secondary polycythemia.   He had Testopel on 12/22 and his T is 628 with a Hgb of 17.7 and Hct 51.6%.  He is doing well on treatment but didn't get the "kick" that he got with the injections.  His PSA is 1.1.   ? ?02/19/21: [name] returns today in f/u for a testopel implant.  His Hgb is down to normal after two phlebotomies.   ? ?11/27/20: [name] returns today in f/u.  He reports he is doing well and the discomfort with ejaculation has improved and is minimal.  A renal stone shows some hepatic cysts and prostate stones but no other significant findings.  His IPSS is 0 and his UA is clear.  He remains on TRT with topicals but was interested in testopel.  He does report a prior history of elevated Hemoglobins with TRT.   ? ?Gu Hx: Tim Rogers is a 66 yo male who is sent for evaluation of perineal pain.  He got an antibiotic for 5 days and his symptoms recurred.  He got another 7 days but the symptoms recurred.  He has now been on the bactrim for 7 weeks.  He has no further pain with ejaculation but he still can have some suprapubic discomfort.  He has increased symptoms with tea or diet coke.  Beer will also impact it.  He is voiding well with an IPSS of 1.  He has minimal change in his bowel habits.   He has had no documented stones or GU surgery.  He thinks he may have passed a stone 4 weeks ago which he had a sharp burning at the tip of the penis x 1. He had a CT AP in 2006 and there were no stones.  He had UA's on 5/24 and 7/13 that had 0-2 RBC's on one.  He has 0-2 RBC's today.   HIs PSA was 1.1 on 05/14/20.  He is on TRT currently with a topical but he had been getting pellets from [facility].  He takes a lot of Vitamins.  His most recent T is about 1000.   ?ROS: ? ?ROS ? ?No Known Allergies ? ?Past Medical History:  ?Diagnosis Date  ? Acid reflux   ? Anxiety   ? 2003  ?  Ascending aortic aneurysm   ? GERD (gastroesophageal reflux disease)   ? Low testosterone   ? PVCs (premature ventricular contractions)   ? ? ?Past Surgical History:  ?Procedure Laterality Date  ? CHOLECYSTECTOMY    ? ESOPHAGOGASTRODUODENOSCOPY N/A 09/21/2017  ? Procedure: ESOPHAGOGASTRODUODENOSCOPY (EGD);  Surgeon: Malissa Hippo, MD;  [facility];  Service: Endoscopy;  Laterality: N/A;  2:55  ? TIF procedure    ? ? ?Social History  ? ?Socioeconomic History  ? Marital status: Single  ?  Spouse name: Not on file  ? Number of children: 0  ? Years of education: 6  ? Highest education level: Not on file  ?Occupational History  ? Occupation: retired  ?Tobacco Use  ? Smoking status: Never  ? Smokeless tobacco: Former  ?  Types: Snuff  ?Vaping Use  ? Vaping Use: Never used  ?Substance and Sexual Activity  ? Alcohol use: Yes  ?  Alcohol/week: 5.0 standard drinks  ?  Types: 5 Cans of beer per week  ?  Comment: couple of beer sometimes daily  ?  Drug use: No  ? Sexual activity: Not Currently  ?  Birth control/protection: None  ?Other Topics Concern  ? Not on file  ?Social History Narrative  ? Retired.Single.Lives alone.  ? Ex-[employer].  ? Tim Rogers- one acre garden  ? Has chickens and turkeys and dogs as pet  ? [place]  ? Online professor  ? ?Social Determinants of Health  ? ?Financial Resource Strain: Not on file  ?Food Insecurity: Not on file  ?Transportation Needs: Not on file  ?Physical Activity: Not on file  ?Stress: Not on file  ?Social Connections: Not on file  ?Intimate Partner Violence: Not on file  ? ? ?Family History  ?Problem Relation Age of Onset  ? Alcohol abuse Mother   ? Cancer Mother   ?     lung  ? COPD Mother   ? Heart disease Mother   ? Early death Mother   ?     age 67 multiple factors  ? Cancer Sister   ?     breast  ? ? ?Anti-infectives: ?Anti-infectives (From admission, onward)  ? ? None  ? ?  ? ? ?Current Outpatient Medications  ?Medication Sig Dispense Refill  ?  amLODipine (NORVASC) 10 MG tablet TAKE 1 TABLET(10 MG) BY MOUTH DAILY 90 tablet 2  ? azelastine (ASTELIN) 0.1 % nasal spray Place 2 sprays into both nostrils daily as needed for allergies. 30 mL 3  ? Cholecalciferol (VITAMIN D-3) 125 MCG (5000 UT) TABS Take 10,000 Units by mouth daily at 12 noon.    ? hydrocortisone (ANUSOL-HC) 2.5 % rectal cream Place 1 application rectally 2 (two) times daily. 28 g 2  ? loratadine (CLARITIN) 10 MG tablet Take 10 mg by mouth daily.    ? OVER THE COUNTER MEDICATION AG1 - one daily    ? pantoprazole (PROTONIX) 40 MG tablet TAKE 1 TABLET BY MOUTH DAILY. TAKE 30 MINUTES BEFORE MEALS 90 tablet 1  ? valACYclovir (VALTREX) 1000 MG tablet Take 1,000 mg by mouth 2 (two) times daily.    ? ?No current facility-administered medications for this visit.  ? ? ? ?Objective: ?Vital signs in last 24 hours: ?BP 103/68   Pulse 77  ? ?Intake/Output from previous day: ?No intake/output data recorded. ?Intake/Output this shift: ?@IOTHISSHIFT @ ? ? ?Physical Exam ? ?Lab Results:  ?Recent Results (from the past 2160 hour(s))  ?Hemoglobin and hematocrit, blood     Status: Abnormal  ? Collection Time: 04/30/21  8:41 AM  ?Result Value Ref Range  ? Hemoglobin 17.7 13.0 - 17.7 g/dL  ? Hematocrit 51.6 (H) 37.5 - 51.0 %  ?Testosterone     Status: None  ? Collection Time: 04/30/21  8:41 AM  ?Result Value Ref Range  ? Testosterone 628 264 - 916 ng/dL  ?  Comment: Adult male reference interval is based on a population of ?healthy nonobese males (BMI <30) between 8519 and 67 years old. ?[place], et.al. JCEM 2017,102;1161-1173. PMID: [id number]. ?  ?Urinalysis, Routine w reflex microscopic     Status: Abnormal  ? Collection Time: 05/14/21 10:20 AM  ?Result Value Ref Range  ? Specific Gravity, UA 1.010 1.005 - 1.030  ? pH, UA 5.5 5.0 - 7.5  ? Color, UA Yellow Yellow  ? Appearance Ur Clear Clear  ? Leukocytes,UA Negative Negative  ? Protein,UA Negative Negative/Trace  ? Glucose, UA Negative Negative  ? Ketones, UA Negative  Negative  ? RBC, UA Trace (A) Negative  ? Bilirubin, UA Negative Negative  ?  Urobilinogen, Ur 0.2 0.2 - 1.0 mg/dL  ? Nitrite, UA Negative Negative  ? Microscopic Examination See below:   ?Microscopic Examination     Status: None  ? Collection Time: 05/14/21 10:20 AM  ? Urine  ?Result Value Ref Range  ? WBC, UA None seen 0 - 5 /hpf  ? RBC None seen 0 - 2 /hpf  ? Epithelial Cells (non renal) None seen 0 - 10 /hpf  ? Renal Epithel, UA None seen None seen /hpf  ? Bacteria, UA None seen None seen/Few  ? ?.brief  ?BMET ?No results for input(s): NA, K, CL, CO2, GLUCOSE, BUN, CREATININE, CALCIUM in the last 72 hours. ?PT/INR ?No results for input(s): LABPROT, INR in the last 72 hours. ?ABG ?No results for input(s): PHART, HCO3 in the last 72 hours. ? ?Invalid input(s): PCO2, PO2 ? ? ?Studies/Results: ?No results found. ? ? ?Procedure:  Testopel implant. ? ?The right upper buttock was prepped with betadine and draped with sterile towels.  The insertion site was chosen and then infiltrated with 39ml of 2% lidocaine without epinepherine.  A small stab wound was made with the 15 blade and the insertion trocar was the passed into the subcutaneous fat.  6 Testopel pellets were then deployed one at a time.   The trocar was removed and then tincture of benzoine was applied adjacent to the wound which was then was closed with overlapping 1/2 inch steristrips.  A dressing of 4x4's and hypofix was applied and he was then rolled supine where he lay for 5 minutes to apply pressure to the wound.   There were no complications.  ? ?Assessment/Plan: ? ?Hypogonadism.  He has a normal T and I reviewed the prescribing info on Testopel and some patients get a 6 month therapeutic response.  I will get a T panel in 1 months and see where he stands before scheduleing the implant.   ? ?History of acquired polycythemia.   H&H was down to 16.6 s/p phlebotomy but is back up to 17.7.  I will recheck in a month to see if he will need to have another  phlebotomy.  ? ?No orders of the defined types were placed in this encounter. ?  ? ?Orders Placed This Encounter  ?Procedures  ? Microscopic Examination  ? Urinalysis, Routine w reflex microscopic  ? Hemoglobin and

## 2021-05-15 ENCOUNTER — Encounter: Payer: Self-pay | Admitting: Nurse Practitioner

## 2021-05-15 ENCOUNTER — Ambulatory Visit (INDEPENDENT_AMBULATORY_CARE_PROVIDER_SITE_OTHER): Payer: Commercial Managed Care - PPO | Admitting: Nurse Practitioner

## 2021-05-15 VITALS — BP 135/84 | HR 70 | Ht 71.0 in | Wt 186.0 lb

## 2021-05-15 DIAGNOSIS — I714 Abdominal aortic aneurysm, without rupture, unspecified: Secondary | ICD-10-CM

## 2021-05-15 DIAGNOSIS — T7840XD Allergy, unspecified, subsequent encounter: Secondary | ICD-10-CM | POA: Diagnosis not present

## 2021-05-15 DIAGNOSIS — I2584 Coronary atherosclerosis due to calcified coronary lesion: Secondary | ICD-10-CM | POA: Diagnosis not present

## 2021-05-15 DIAGNOSIS — T7840XA Allergy, unspecified, initial encounter: Secondary | ICD-10-CM | POA: Insufficient documentation

## 2021-05-15 DIAGNOSIS — I2510 Atherosclerotic heart disease of native coronary artery without angina pectoris: Secondary | ICD-10-CM | POA: Diagnosis not present

## 2021-05-15 NOTE — Assessment & Plan Note (Addendum)
Takes Claritin 10 mg daily, azelastine 2 spray daily as needed, has tried [employer] states that nothing really works. ?Avoid allergens,left ear pain now resolving he refused referral to ENT today  ?Continue current medications.  ?

## 2021-05-15 NOTE — Progress Notes (Signed)
? ?New Patient Office Visit ? ?Subjective:  ?Patient ID: Tim Rogers, male    DOB: [birth year]  Age: 65 y.o. MRN: [id number] ? ?CC:  ?Chief Complaint  ?Patient presents with  ? New Patient (Initial Visit)  ?  NP  ? Ear Pain  ?  Left ear pain for a little over a week around 3/8  ? ? ?HPI ?Tim Rogers past medical history of AAA without rupture, GERD, hypogonadism in male presents to establish care for his chronic medical conditions. Previous PCP Dr Tim Rogers.  ? ?Patient states that he has enlarged aorta, takes amlodipine 20m daily, [place] [employer] followed by cardiology, does CT once a year.  Denies abdominal pain, chest pain, dizziness.  ? ?Takes valtrex 10013mas needed for herpes simplex 11 , last used 4 months ago.  Patient denies current herpez infection ? ?Pt c/o left ear pain for over a week, had dental appointment last week, dentist told him that he might have swimmers ears but that everything was good. Pt states that his ear pain is 1/10 today. He used some OTC  ear drops that helps. Has Sneezing , watery eyes, takes azelastine nasal spray daily, Claritin 10 mg daily. Has tried , Flonase , nothing works , has had 2 allergens test and that they were negative, has a dog at home, continue to have post nasal drop.  ? ?Pt [facility].  ? 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 ? ?Followed by urology gets Testopel implants for hypogonadism.,  Reports doing well on treatment.  ? ?Past Medical History:  ?Diagnosis Date  ? Acid reflux   ? Anxiety   ? 2003  ? Ascending aortic aneurysm   ? GERD (gastroesophageal reflux disease)   ? Low testosterone   ? PVCs (premature ventricular contractions)   ? ? ?Past Surgical History:  ?Procedure Laterality Date  ? CHOLECYSTECTOMY    ?  ESOPHAGOGASTRODUODENOSCOPY N/A 09/21/2017  ? Procedure: ESOPHAGOGASTRODUODENOSCOPY (EGD);  Surgeon: ReRogene HoustonMD;  [facility];  Service: Endoscopy;  Laterality: N/A;  2:55  ? TIF procedure    ? ? ?Family History  ?Problem Relation Age of Onset  ? Alcohol abuse Mother   ? Cancer Mother   ?     lung  ? COPD Mother   ? Heart disease Mother   ? Early death Mother   ?     age 89ultiple factors  ? Cancer Sister   ?     breast  ? ? ?Social History  ? ?Socioeconomic History  ? Marital status: Single  ?  Spouse name: Not on file  ? Number of children: 0  ? Years of education: 1836? Highest education level: Not on file  ?Occupational History  ? Occupation: retired  ?Tobacco Use  ? Smoking status: Never  ? Smokeless tobacco: Former  ?  Types: Snuff  ?Vaping Use  ? Vaping Use: Never used  ?Substance and Sexual Activity  ? Alcohol use: Yes  ?  Alcohol/week: 5.0 standard drinks  ?  Types: 5 Cans of beer per week  ?  Comment: couple of beer sometimes daily  ? Drug use: No  ? Sexual activity: Not Currently  ?  Birth  control/protection: None  ?Other Topics Concern  ? Not on file  ?Social History Narrative  ? Retired.Single.Lives alone.  ? [employer].  ? [place]- one acre garden  ? Has chickens and turkeys and dogs as pet  ? 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 Online professor  ? ?Social Determinants of Health  ? ?Financial Resource Strain: Not on file  ?Food Insecurity: Not on file  ?Transportation Needs: Not on file  ?Physical Activity: Not on file  ?Stress: Not on file  ?Social Connections: Not on file  ?Intimate Partner Violence: Not on file  ? ? ?ROS ?Review of Systems  ?Constitutional: Negative.   ?HENT:  Positive for ear pain and sneezing. Negative for dental problem, drooling, ear discharge, hearing loss, mouth sores and sinus pain.   ?Eyes:  Negative for photophobia, pain, redness, itching and visual disturbance.  ?     Watery eyes  ?Respiratory: Negative.    ?Cardiovascular: Negative.   ?Musculoskeletal:  Negative.   ?Neurological: Negative.   ?Psychiatric/Behavioral: Negative.    ? ?Objective:  ? ?Today's Vitals: BP 135/84 (BP Location: Right Arm, Patient Position: Sitting, Cuff Size: Large)   Pulse 70   Ht '5\' 11"'  (1.803 m)   Wt 186 lb (84.4 kg)   SpO2 99%   BMI 25.94 kg/m?  ? ?Physical Exam ?Constitutional:   ?   General: He is not in acute distress. ?   Appearance: Normal appearance. He is normal weight. He is not ill-appearing, toxic-appearing or diaphoretic.  ?HENT:  ?   Head: Normocephalic and atraumatic.  ?   Right Ear: Tympanic membrane, ear canal and external ear normal. There is no impacted cerumen.  ?   Left Ear: Tympanic membrane, ear canal and external ear normal. There is no impacted cerumen.  ?   Nose: Nose normal. No congestion or rhinorrhea.  ?   Mouth/Throat:  ?   Mouth: Mucous membranes are moist.  ?   Pharynx: No oropharyngeal exudate or posterior oropharyngeal erythema.  ?Eyes:  ?   General: No scleral icterus.    ?   Right eye: No discharge.     ?   Left eye: No discharge.  ?   Extraocular Movements: Extraocular movements intact.  ?   Conjunctiva/sclera: Conjunctivae normal.  ?   Pupils: Pupils are equal, round, and reactive to light.  ?Cardiovascular:  ?   Rate and Rhythm: Normal rate and regular rhythm.  ?   Pulses: Normal pulses.  ?   Heart sounds: Normal heart sounds. No murmur heard. ?  No friction rub. No gallop.  ?Pulmonary:  ?   Effort: Pulmonary effort is normal. No respiratory distress.  ?   Breath sounds: Normal breath sounds. No stridor. No wheezing, rhonchi or rales.  ?Chest:  ?   Chest wall: No tenderness.  ?Neurological:  ?   Mental Status: He is alert and oriented to person, place, and time.  ?   Cranial Nerves: No cranial nerve deficit.  ?   Sensory: No sensory deficit.  ?   Motor: No weakness.  ?   Coordination: Coordination normal.  ?   Gait: Gait normal.  ?   Deep Tendon Reflexes: Reflexes normal.  ?Psychiatric:     ?   Mood and Affect: Mood normal.     ?   Behavior:  Behavior normal.     ?   Thought Content: Thought content normal.     ?   Judgment: Judgment normal.  ? ? ?Assessment &  Plan:  ? ?Problem List Items Addressed This Visit   ? ?  ? Cardiovascular and Mediastinum  ? AAA (abdominal aortic aneurysm) without rupture - Primary  ?  BP Readings from Last 3 Encounters:  ?05/15/21 135/84  ?05/14/21 103/68  ?02/19/21 108/73  ?Takes amlodipine 10 mg daily, blood pressure well controlled ?Followed by cardiology.  ?Last CT scan done in November 2022 shows fusiform aneurysm of the ascending thoracic aorta measuring up to ?4.4 cm. Aneurysmal change of the sinuses of Valsalva, measuring up ?to 4.6 cm. Recommend annual imaging followup by CTA or MRA ? ?  ?  ? Relevant Orders  ? CMP14+EGFR  ? Coronary artery calcification  ?  Mild atherosclerosis calcification of  Coronary arteries  noted on recent CT done in Nov 2022.  ?Will check lipid panel at next visit, if LDL is between 100 and 190 and ASCVD greater than 10% will treat with medication.   ?Avoid fried fatty foods , continue to engage in regular  Exercises ?Pt states that he is not interested in taking statins.  ? ? ?  ?  ?  ? Other  ? Allergies  ?  Takes Claritin 10 mg daily, azelastine 2 spray daily as needed, has tried [facility] states that nothing really works. ?Avoid allergens, he refused referral to ENT today  ?Continue current medications.  ?  ?  ? ? ?Outpatient Encounter Medications as of 05/15/2021  ?Medication Sig  ? amLODipine (NORVASC) 10 MG tablet TAKE 1 TABLET(10 MG) BY MOUTH DAILY  ? azelastine (ASTELIN) 0.1 % nasal spray Place 2 sprays into both nostrils daily as needed for allergies.  ? Cholecalciferol (VITAMIN D-3) 125 MCG (5000 UT) TABS Take 10,000 Units by mouth daily at 12 noon.  ? hydrocortisone (ANUSOL-HC) 2.5 % rectal cream Place 1 application rectally 2 (two) times daily.  ? loratadine (CLARITIN) 10 MG tablet Take 10 mg by mouth daily.  ? OVER THE COUNTER MEDICATION AG1 - one daily  ? pantoprazole (PROTONIX)  40 MG tablet TAKE 1 TABLET BY MOUTH DAILY. TAKE 30 MINUTES BEFORE MEALS  ? valACYclovir (VALTREX) 1000 MG tablet Take 1,000 mg by mouth 2 (two) times daily. (Patient not taking: Reported on 05/15/2021)  ? ?No facility-a

## 2021-05-15 NOTE — Assessment & Plan Note (Signed)
BP Readings from Last 3 Encounters:  ?05/15/21 135/84  ?05/14/21 103/68  ?02/19/21 108/73  ? ?Takes amlodipine 10 mg daily, blood pressure well controlled ?Followed by cardiology.  ?Last CT scan done in November 2022 shows fusiform aneurysm of the ascending thoracic aorta measuring up to ?4.4 cm. Aneurysmal change of the sinuses of Valsalva, measuring up ?to 4.6 cm. Recommend annual imaging followup by CTA or MRA ? ?

## 2021-05-15 NOTE — Patient Instructions (Signed)

## 2021-05-15 NOTE — Assessment & Plan Note (Addendum)
Mild atherosclerosis calcification of  Coronary arteries  noted on recent CT done in Nov 2022.  ?Will check lipid panel at next visit, if LDL is between 100 and 190 and ASCVD greater than 10% will treat with medication.   ?Avoid fried fatty foods , continue to engage in regular  Exercises ?Pt states that he is not interested in taking statins.  ? ? ?

## 2021-05-16 LAB — CMP14+EGFR
ALT: 26 IU/L (ref 0–44)
AST: 21 IU/L (ref 0–40)
Albumin/Globulin Ratio: 1.8 (ref 1.2–2.2)
Albumin: 4.6 g/dL (ref 3.8–4.8)
Alkaline Phosphatase: 95 IU/L (ref 44–121)
BUN/Creatinine Ratio: 19 (ref 10–24)
BUN: 19 mg/dL (ref 8–27)
Bilirubin Total: 1.1 mg/dL (ref 0.0–1.2)
CO2: 24 mmol/L (ref 20–29)
Calcium: 9.4 mg/dL (ref 8.6–10.2)
Chloride: 102 mmol/L (ref 96–106)
Creatinine, Ser: 1.02 mg/dL (ref 0.76–1.27)
Globulin, Total: 2.6 g/dL (ref 1.5–4.5)
Glucose: 64 mg/dL — ABNORMAL LOW (ref 70–99)
Potassium: 4.3 mmol/L (ref 3.5–5.2)
Sodium: 140 mmol/L (ref 134–144)
Total Protein: 7.2 g/dL (ref 6.0–8.5)
eGFR: 81 mL/min/{1.73_m2} (ref >59)

## 2021-05-18 NOTE — Progress Notes (Signed)
Normal kidney function, liver function, normal electrolytes.  Glucose level little bit low but nothing to be worried about since patient does not have diabetes and is not taking diabetes medications. ?Thank you

## 2021-06-18 ENCOUNTER — Other Ambulatory Visit: Payer: Medicare Other

## 2021-06-18 DIAGNOSIS — E291 Testicular hypofunction: Secondary | ICD-10-CM

## 2021-06-18 DIAGNOSIS — D751 Secondary polycythemia: Secondary | ICD-10-CM

## 2021-06-19 LAB — TESTOSTERONE FREE, PROFILE I
Albumin: 4.4 g/dL (ref 3.8–4.8)
Sex Hormone Binding: 114 nmol/L — ABNORMAL HIGH (ref 19.3–76.4)
Testost., Free, Calc: 65.5 pg/mL (ref 34.7–150.3)
Testosterone: 765 ng/dL (ref 264–916)

## 2021-06-19 LAB — HEMOGLOBIN AND HEMATOCRIT, BLOOD
Hematocrit: 49.3 % (ref 37.5–51.0)
Hemoglobin: 17.1 g/dL (ref 13.0–17.7)

## 2021-08-04 ENCOUNTER — Other Ambulatory Visit (INDEPENDENT_AMBULATORY_CARE_PROVIDER_SITE_OTHER): Payer: Self-pay | Admitting: Gastroenterology

## 2021-08-04 DIAGNOSIS — K219 Gastro-esophageal reflux disease without esophagitis: Secondary | ICD-10-CM

## 2021-08-06 ENCOUNTER — Ambulatory Visit
Admission: EM | Admit: 2021-08-06 | Discharge: 2021-08-06 | Disposition: A | Discharge status: Home / Self Care | Payer: Commercial Managed Care - PPO | Attending: Family Medicine | Admitting: Family Medicine

## 2021-08-06 DIAGNOSIS — R0982 Postnasal drip: Secondary | ICD-10-CM | POA: Diagnosis present

## 2021-08-06 DIAGNOSIS — J3089 Other allergic rhinitis: Secondary | ICD-10-CM | POA: Diagnosis present

## 2021-08-06 DIAGNOSIS — J029 Acute pharyngitis, unspecified: Secondary | ICD-10-CM | POA: Diagnosis present

## 2021-08-06 LAB — POCT RAPID STREP A (OFFICE): Rapid Strep A Screen: NEGATIVE

## 2021-08-06 MED ORDER — AZELASTINE HCL 0.1 % NA SOLN: 1.0000 | Freq: Two times a day (BID) | NASAL | 3 refills | Status: AC

## 2021-08-06 MED ORDER — MONTELUKAST SODIUM 10 MG PO TABS: 10.0000 mg | ORAL_TABLET | Freq: Every day | ORAL | 2 refills | Status: DC

## 2021-08-06 MED ORDER — DEXAMETHASONE SODIUM PHOSPHATE 10 MG/ML IJ SOLN
10.0000 mg | Freq: Once | INTRAMUSCULAR | Status: AC
  Administered 2021-08-06: 10 mg via INTRAMUSCULAR

## 2021-08-06 NOTE — ED Provider Notes (Signed)
[facility]    CSN: [id number] Arrival date & time: 08/06/21  1657      History   Chief Complaint Chief Complaint  Patient presents with   Otalgia    HPI Tim Rogers is a 66 y.o. male.   Presenting today with several weeks of left tonsillar soreness, swollen sensation, left ear pain and pressure, sinus pressure, runny nose, postnasal drainage, clearing throat often.  Denies fever, chills, difficulty breathing or swallowing, chest tightness, wheezing.  Takes an antihistamine and Astelin nasal spray daily with no benefit to symptoms.  Has an appointment with ear nose and throat but not until next month.    Past Medical History:  Diagnosis Date   Acid reflux    Anxiety    2003   Ascending aortic aneurysm (HCC)    GERD (gastroesophageal reflux disease)    Low testosterone    PVCs (premature ventricular contractions)     Patient Active Problem List   Diagnosis Date Noted   Coronary artery calcification 05/15/2021   Allergies 05/15/2021   Hemorrhoids 11/20/2020   Anal burning 11/20/2020   Anal itching 11/20/2020   Gastroesophageal reflux disease without esophagitis 08/15/2017   Positive PPD 10/08/2016   Polycythemia 10/08/2016   Hypogonadism in male 10/01/2016   Environmental allergies 10/01/2016   Chronic GERD 10/01/2016   AAA (abdominal aortic aneurysm) without rupture (HCC) 10/01/2016   Recurrent 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 Authorizing Provider  montelukast (SINGULAIR) 10 MG tablet Take 1 tablet (10 mg total) by mouth at  bedtime. 08/06/21  Yes Particia NearingLane, Chasady Longwell Elizabeth, PA-C  amLODipine (NORVASC) 10 MG tablet TAKE 1 TABLET(10 MG) BY MOUTH DAILY 05/01/21   Jonelle SidleMcDowell, Samuel G, MD  azelastine (ASTELIN) 0.1 % nasal spray Place 1 spray into both nostrils 2 (two) times daily. 08/06/21   Particia NearingLane, Mckinzie Saksa Elizabeth, PA-C  Cholecalciferol (VITAMIN D-3) 125 MCG (5000 UT) TABS Take 10,000 Units by mouth daily at 12 noon.    [provider]  hydrocortisone (ANUSOL-HC) 2.5 % rectal cream Place 1 application rectally 2 (two) times daily. 11/24/20   Carlan, Chelsea L, NP  loratadine (CLARITIN) 10 MG tablet Take 10 mg by mouth daily.    [provider]  OVER THE COUNTER MEDICATION AG1 - one daily    [provider]  pantoprazole (PROTONIX) 40 MG tablet TAKE 1 TABLET BY MOUTH DAILY. TAKE 30 MINUTES BEFORE MEALS 08/04/21   Carlan, Chelsea L, NP  valACYclovir (VALTREX) 1000 MG tablet Take 1,000 mg by mouth 2 (two) times daily. Patient not taking: Reported on 05/15/2021 08/28/20   [provider]    Family History Family History  Problem Relation Age of Onset   Alcohol abuse Mother    Cancer Mother        lung   COPD Mother    Heart disease Mother    Early death Mother        age 67 multiple factors   Cancer Sister        breast    Social History Social History  Tobacco Use   Smoking status: Never   Smokeless tobacco: Former    Types: Snuff  Vaping Use   Vaping Use: Never used  Substance Use Topics   Alcohol use: Yes    Alcohol/week: 5.0 standard drinks of alcohol    Types: 5 Cans of beer per week    Comment: couple of beer sometimes daily   Drug use: No     Allergies   Patient has no known allergies.   Review of Systems Review of Systems Per HPI  Physical Exam Triage Vital Signs ED Triage Vitals  Enc Vitals Group     BP 08/06/21 1715 92/61     Pulse Rate 08/06/21 1715 80     Resp 08/06/21 1715 18     Temp 08/06/21 1715 98.6 F (37 C)     Temp Source 08/06/21 1715 Oral      SpO2 08/06/21 1715 94 %     Weight --      Height --      Head Circumference --      Peak Flow --      Pain Score 08/06/21 1713 1     Pain Loc --      Pain Edu? --      Excl. in GC? --    No data found.  Updated Vital Signs BP 92/61 (BP Location: Right Arm)   Pulse 80   Temp 98.6 F (37 C) (Oral)   Resp 18   SpO2 94%   Visual Acuity Right Eye Distance:   Left Eye Distance:   Bilateral Distance:    Right Eye Near:   Left Eye Near:    Bilateral Near:     Physical Exam Vitals and nursing note reviewed.  Constitutional:      Appearance: Normal appearance.  HENT:     Head: Atraumatic.     Ears:     Comments: Mild middle ear effusions bilaterally    Nose:     Comments: Boggy erythematous nasal turbinates    Mouth/Throat:     Mouth: Mucous membranes are moist.     Pharynx: Posterior oropharyngeal erythema present.     Comments: Posterior oropharyngeal erythema, minimal tonsillar edema with no exudates, tonsil stones appreciable Eyes:     Extraocular Movements: Extraocular movements intact.     Conjunctiva/sclera: Conjunctivae normal.  Cardiovascular:     Rate and Rhythm: Normal rate and regular rhythm.  Pulmonary:     Effort: Pulmonary effort is normal.     Breath sounds: Normal breath sounds.  Musculoskeletal:        General: Normal range of motion.     Cervical back: Normal range of motion and neck supple.  Skin:    General: Skin is warm and dry.  Neurological:     General: No focal deficit present.     Mental Status: He is oriented to person, place, and time.  Psychiatric:        Mood and Affect: Mood normal.        Thought Content: Thought content normal.        Judgment: Judgment normal.      UC Treatments / Results  Labs (all labs ordered are listed, but only abnormal results are displayed) Labs Reviewed  CULTURE, GROUP A STREP [facility])  POCT RAPID STREP A (OFFICE)    EKG   Radiology No results found.  Procedures Procedures (including  critical care time)  Medications Ordered in UC Medications  dexamethasone (DECADRON) injection 10  mg (10 mg Intramuscular Given 08/06/21 1805)    Initial Impression / Assessment and Plan / UC Course  I have reviewed the triage vital signs and the nursing notes.  Pertinent labs & imaging results that were available during my care of the patient were reviewed by me and considered in my medical decision making (see chart for details).     Rapid strep negative, throat culture pending.  Vitals and exam overall reassuring and suggestive of uncontrolled allergic rhinitis.  IM Decadron given to help with inflammatory changes causing many of his symptoms and will start Singulair in addition to twice daily Astelin, antihistamine daily.  Follow-up with ear nose and throat as scheduled.  Return for worsening symptoms at any time.  Final Clinical Impressions(s) / UC Diagnoses   Final diagnoses:  Sore throat  Post-nasal drip  Seasonal allergic rhinitis due to other allergic trigger   Discharge Instructions   None    ED Prescriptions     Medication Sig Dispense Auth. Provider   montelukast (SINGULAIR) 10 MG tablet Take 1 tablet (10 mg total) by mouth at bedtime. 30 tablet Particia Nearing, [place]   azelastine (ASTELIN) 0.1 % nasal spray Place 1 spray into both nostrils 2 (two) times daily. 30 mL Particia Nearing, [place]      PDMP not reviewed this encounter.   Particia Nearing, [place] 08/06/21 1835

## 2021-08-06 NOTE — ED Triage Notes (Signed)
States soreness in tonsils, left ear pain and sinus pressure x 2-3 weeks. States feel funny when rub the tongue "across the throat".

## 2021-08-09 ENCOUNTER — Other Ambulatory Visit: Payer: Self-pay

## 2021-08-09 ENCOUNTER — Encounter (HOSPITAL_COMMUNITY): Payer: Self-pay | Admitting: Emergency Medicine

## 2021-08-09 ENCOUNTER — Emergency Department (HOSPITAL_COMMUNITY): Payer: Commercial Managed Care - PPO

## 2021-08-09 ENCOUNTER — Emergency Department (HOSPITAL_COMMUNITY)
Admission: EM | Admit: 2021-08-09 | Discharge: 2021-08-09 | Disposition: A | Discharge status: Home / Self Care | Payer: Commercial Managed Care - PPO | Attending: Emergency Medicine | Admitting: Emergency Medicine

## 2021-08-09 DIAGNOSIS — R040 Epistaxis: Secondary | ICD-10-CM | POA: Diagnosis not present

## 2021-08-09 DIAGNOSIS — R0981 Nasal congestion: Secondary | ICD-10-CM | POA: Insufficient documentation

## 2021-08-09 LAB — CULTURE, GROUP A STREP (THRC)

## 2021-08-09 NOTE — ED Triage Notes (Signed)
Pt states he has had a ear ache and sore throat. Went to Urgent care where he received nasal spray and "a pill."  Throat and ear are still sore. Went to bed last night after cutting grass, a big dinner, and a couple beers. He went to bed. Got up this morning and coughed. He reports coughing up blood and blowing blood out of his nose.

## 2021-08-09 NOTE — ED Provider Notes (Signed)
[facility] EMERGENCY DEPARTMENT Provider Note   CSN: [id number] Arrival date & time: 08/09/21  6754     History  Chief Complaint  Patient presents with   Hemoptysis    Tim Rogers is a 66 y.o. male.  HPI  66 year old male presents to the emergency department with concern for nosebleed and bloody mucus.  Patient states he has been struggling with allergies.  Recently went to urgent care with nasal congestion and left ear discomfort.  He uses a daily nasal spray but was given an additional allergy pill and Singulair.  He states he cut grass and did a lot of activity last night.  When he woke up this morning he felt congested.  Blew his nose which had dried blood and he coughed up blood-tinged sputum.  Since then he has had no nose bleeding/hemoptysis.  He has no throat pain, chest pain, cough.  He has an outpatient follow-up with ear nose and throat in 2 months but wanted to come in and get checked out.  Denies any fever.  Home Medications Prior to Admission medications   Medication Sig Start Date End Date Taking? Authorizing Provider  amLODipine (NORVASC) 10 MG tablet TAKE 1 TABLET(10 MG) BY MOUTH DAILY 05/01/21   Jonelle Sidle, MD  azelastine (ASTELIN) 0.1 % nasal spray Place 1 spray into both nostrils 2 (two) times daily. 08/06/21   Particia Nearing, PA-C  Cholecalciferol (VITAMIN D-3) 125 MCG (5000 UT) TABS Take 10,000 Units by mouth daily at 12 noon.    [provider]  hydrocortisone (ANUSOL-HC) 2.5 % rectal cream Place 1 application rectally 2 (two) times daily. 11/24/20   Carlan, Chelsea L, NP  loratadine (CLARITIN) 10 MG tablet Take 10 mg by mouth daily.    [provider]  montelukast (SINGULAIR) 10 MG tablet Take 1 tablet (10 mg total) by mouth at bedtime. 08/06/21   Particia Nearing, PA-C  OVER THE COUNTER MEDICATION AG1 - one daily    [provider]  pantoprazole (PROTONIX) 40 MG tablet TAKE 1 TABLET BY MOUTH DAILY. TAKE 30  MINUTES BEFORE MEALS 08/04/21   Carlan, Chelsea L, NP  valACYclovir (VALTREX) 1000 MG tablet Take 1,000 mg by mouth 2 (two) times daily. Patient not taking: Reported on 05/15/2021 08/28/20   [provider]      Allergies    Patient has no known allergies.    Review of Systems   Review of Systems  Constitutional:  Negative for fever.  HENT:  Positive for congestion, ear pain, nosebleeds, postnasal drip and rhinorrhea.   Respiratory:  Negative for cough, chest tightness, shortness of breath and wheezing.   Cardiovascular:  Negative for chest pain and palpitations.  Gastrointestinal:  Negative for nausea and vomiting.  Skin:  Negative for rash.  Neurological:  Negative for headaches.    Physical Exam Updated Vital Signs BP 134/86   Pulse (!) 56   Temp 97.9 F (36.6 C) (Oral)   Resp 17   Ht 5\' 11"  (1.803 m)   Wt 80.7 kg   SpO2 98%   BMI 24.83 kg/m  Physical Exam Vitals and nursing note reviewed.  Constitutional:      General: He is not in acute distress.    Appearance: Normal appearance. He is not diaphoretic.  HENT:     Head: Normocephalic.     Right Ear: Ear canal normal.     Left Ear: Ear canal normal.     Nose: Congestion present.  Mouth/Throat:     Mouth: Mucous membranes are moist.  Cardiovascular:     Rate and Rhythm: Normal rate.  Pulmonary:     Effort: Pulmonary effort is normal. No respiratory distress.     Breath sounds: No wheezing or rales.  Abdominal:     Palpations: Abdomen is soft.     Tenderness: There is no abdominal tenderness.  Skin:    General: Skin is warm.  Neurological:     Mental Status: He is alert and oriented to person, place, and time. Mental status is at baseline.  Psychiatric:        Mood and Affect: Mood normal.     ED Results / Procedures / Treatments   Labs (all labs ordered are listed, but only abnormal results are displayed) Labs Reviewed - No data to display  EKG None  Radiology DG Chest 2 View  Result  Date: 08/09/2021 CLINICAL DATA:  Productive cough. EXAM: CHEST - 2 VIEW COMPARISON:  09/01/2004. FINDINGS: The lungs are clear without focal pneumonia, edema, pneumothorax or pleural effusion. Interstitial markings are diffusely coarsened with chronic features. The cardiopericardial silhouette is within normal limits for size. The visualized bony structures of the thorax are unremarkable. IMPRESSION: No active cardiopulmonary disease. Electronically Signed   By: Kennith CenterEric  Mansell M.D.   On: 08/09/2021 08:55    Procedures Procedures    Medications Ordered in ED Medications - No data to display  ED Course/ Medical Decision Making/ A&P                           Medical Decision Making Amount and/or Complexity of Data Reviewed Radiology: ordered.   66 year old male presents emergency department with nasal irritation, nosebleed, coughing up blood-tinged sputum.  Patient states he did do his typical nasal washout last night, has been struggling with allergies.  Was seen urgent care.  He is currently using a daily nasal spray, now is taking an antihistamine.  He states when he wakes up in the morning he typically blows his nose and coughs up clear sputum.  However this time when he blew his nose there was dried blood in the snot and his sputum had some blood in it.  Since this isolated event there has been no ongoing cough/hemoptysis.  There was no chest pain.  He otherwise feels baseline.  He is been having ongoing left ear congestion and pain of which she is going to follow-up with ENT as an outpatient.  He does not take any blood thinning medication.  Has history of thoracic aortic aneurysm that is being monitored closely as an outpatient.  Again no chest pain or back pain.  Chest x-ray is unremarkable.  Patient has no blood in the nasal/oropharynx on exam.  Do not think that this is genuine hemoptysis in regards to a cardiac/respiratory etiology.  I believe the patient is suffering from allergies, dry  mucous membranes secondary to allergies/nasal spray and medications.  This most likely caused irritation, mucosal bleeding.  He otherwise feels well and has no acute complaints.  We will plan for outpatient follow-up.  Patient at this time appears safe and stable for discharge and close outpatient follow up. Discharge plan and strict return to ED precautions discussed, patient verbalizes understanding and agreement.        Final Clinical Impression(s) / ED Diagnoses Final diagnoses:  Epistaxis    Rx / DC Orders ED Discharge Orders     None  Rozelle Logan, DO 08/09/21 1042

## 2021-08-09 NOTE — Discharge Instructions (Signed)
You have been seen and discharged from the emergency department.  Your chest x-ray was unremarkable, upper airway/lungs look normal.  You may discontinue the Singulair if you would like.  Continue nasal spray and Claritin/Zyrtec.  Add a nasal hydration agent like Vaseline/a Viar saline gel.  Follow-up with your ear nose and throat doctor and primary provider for further evaluation and further care. Take home medications as prescribed. If you have any worsening symptoms or further concerns for your health please return to an emergency department for further evaluation.

## 2021-08-26 ENCOUNTER — Encounter (INDEPENDENT_AMBULATORY_CARE_PROVIDER_SITE_OTHER): Payer: Medicare Other | Admitting: Nurse Practitioner

## 2021-08-27 ENCOUNTER — Ambulatory Visit (INDEPENDENT_AMBULATORY_CARE_PROVIDER_SITE_OTHER): Payer: Commercial Managed Care - PPO | Admitting: Urology

## 2021-08-27 VITALS — BP 112/71 | HR 67

## 2021-08-27 DIAGNOSIS — I2584 Coronary atherosclerosis due to calcified coronary lesion: Secondary | ICD-10-CM | POA: Diagnosis not present

## 2021-08-27 DIAGNOSIS — E291 Testicular hypofunction: Secondary | ICD-10-CM

## 2021-08-27 DIAGNOSIS — D751 Secondary polycythemia: Secondary | ICD-10-CM

## 2021-08-27 DIAGNOSIS — I2510 Atherosclerotic heart disease of native coronary artery without angina pectoris: Secondary | ICD-10-CM | POA: Diagnosis not present

## 2021-08-27 MED ORDER — TESTOSTERONE 75 MG IL PLLT
450.0000 mg | PELLET | Freq: Once | Status: AC
  Administered 2021-08-27: 450 mg

## 2021-08-27 NOTE — Progress Notes (Signed)
Subjective: 1. Hypogonadism in male   2. Secondary polycythemia     08/27/21: Tim Rogers returns today for testopel insertion for his history noted below.  His last labs were in 06/18/21 and his T was 765 and Hgb was 17.1.   05/14/21: Tim Rogers returns today in f/u for his history of hypogonadism and secondary polycythemia.   He had Testopel on 12/22 and his T is 628 with a Hgb of 17.7 and Hct 51.6%.  He is doing well on treatment but didn't get the "kick" that he got with the injections.  His PSA is 1.1.    02/19/21: [name] returns today in f/u for a testopel implant.  His Hgb is down to normal after two phlebotomies.    11/27/20: [name] returns today in f/u.  He reports he is doing well and the discomfort with ejaculation has improved and is minimal.  A renal stone shows some hepatic cysts and prostate stones but no other significant findings.  His IPSS is 0 and his UA is clear.  He remains on TRT with topicals but was interested in testopel.  He does report a prior history of elevated Hemoglobins with TRT.    Gu Hx: [name] is a 66 yo male who is sent for evaluation of perineal pain.  He got an antibiotic for 5 days and his symptoms recurred.  He got another 7 days but the symptoms recurred.  He has now been on the bactrim for 7 weeks.  He has no further pain with ejaculation but he still can have some suprapubic discomfort.  He has increased symptoms with tea or diet coke.  Beer will also impact it.  He is voiding well with an IPSS of 1.  He has minimal change in his bowel habits.   He has had no documented stones or GU surgery.  He thinks he may have passed a stone 4 weeks ago which he had a sharp burning at the tip of the penis x 1. He had a CT AP in 2006 and there were no stones.  He had UA's on 5/24 and 7/13 that had 0-2 RBC's on one.  He has 0-2 RBC's today.   HIs PSA was 1.1 on 05/14/20.  He is on TRT currently with a topical but he had been getting pellets from [facility].  He takes a lot of Vitamins.  His  most recent T is about 1000.   ROS:  ROS  No Known Allergies  Past Medical History:  Diagnosis Date   Acid reflux    Anxiety    2003   Ascending aortic aneurysm (HCC)    GERD (gastroesophageal reflux disease)    Low testosterone    PVCs (premature ventricular contractions)     Past Surgical History:  Procedure Laterality Date   CHOLECYSTECTOMY     ESOPHAGOGASTRODUODENOSCOPY N/A 09/21/2017   Procedure: ESOPHAGOGASTRODUODENOSCOPY (EGD);  Surgeon: Malissa Hippo, MD;  Location: [facility];  Service: Endoscopy;  Laterality: N/A;  2:55   TIF procedure      Social History   Socioeconomic History   Marital status: Single    Spouse name: Not on file   Number of children: 0   Years of education: 62   Highest education level: Not on file  Occupational History   Occupation: retired  Tobacco Use   Smoking status: Never   Smokeless tobacco: Former    Types: Snuff  Vaping Use   Vaping Use: Never used  Substance and Sexual Activity  Alcohol use: Yes    Alcohol/week: 5.0 standard drinks of alcohol    Types: 5 Cans of beer per week    Comment: couple of beer sometimes daily   Drug use: No   Sexual activity: Not Currently    Birth control/protection: None  Other Topics Concern   Not on file  Social History Narrative   Retired.Single.Lives alone.   Ex-[employer].   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   Social Determinants of Corporate investment banker Strain: Not on [employer] Insecurity: Not on file  Transportation Needs: Not on file  Physical Activity: Not on file  Stress: Not on file  Social Connections: Not on file  Intimate Partner Violence: Not on file    Family History  Problem Relation Age of Onset   Alcohol abuse Mother    Cancer Mother        lung   COPD Mother    Heart disease Mother    Early death Mother        age 2 multiple factors   Cancer Sister         breast    Anti-infectives: Anti-infectives (From admission, onward)    None       Current Outpatient Medications  Medication Sig Dispense Refill   amLODipine (NORVASC) 10 MG tablet TAKE 1 TABLET(10 MG) BY MOUTH DAILY 90 tablet 2   azelastine (ASTELIN) 0.1 % nasal spray Place 1 spray into both nostrils 2 (two) times daily. 30 mL 3   Cholecalciferol (VITAMIN D-3) 125 MCG (5000 UT) TABS Take 10,000 Units by mouth daily at 12 noon.     hydrocortisone (ANUSOL-HC) 2.5 % rectal cream Place 1 application rectally 2 (two) times daily. 28 g 2   loratadine (CLARITIN) 10 MG tablet Take 10 mg by mouth daily.     montelukast (SINGULAIR) 10 MG tablet Take 1 tablet (10 mg total) by mouth at bedtime. 30 tablet 2   OVER THE COUNTER MEDICATION AG1 - one daily     pantoprazole (PROTONIX) 40 MG tablet TAKE 1 TABLET BY MOUTH DAILY. TAKE 30 MINUTES BEFORE MEALS 90 tablet 1   valACYclovir (VALTREX) 1000 MG tablet Take 1,000 mg by mouth 2 (two) times daily. (Patient not taking: Reported on 05/15/2021)     No current facility-administered medications for this visit.     Objective: Vital signs in last 24 hours: BP 112/71   Pulse 67   Intake/Output from previous day: No intake/output data recorded. Intake/Output this shift: @IOTHISSHIFT @   Physical Exam  Lab Results:  Recent Results (from the past 2160 hour(s))  Testosterone Free, Profile I     Status: Abnormal   Collection Time: 06/18/21  9:59 AM  Result Value Ref Range   Albumin 4.4 3.8 - 4.8 g/dL   Testosterone 06/20/21 833 - 916 ng/dL    Comment: Adult male reference interval is based on a population of healthy nonobese males (BMI <30) between 27 and 18 years old. Travison, et.al. JCEM [phone]. PMID: 0539,767;3419-3790.    Sex Hormone Binding 114.0 (H) 19.3 - 76.4 nmol/L   Testost., Free, Calc 65.5 34.7 - 150.3 pg/mL  Hemoglobin and hematocrit, blood     Status: None   Collection Time: 06/18/21  9:59 AM  Result Value Ref Range   Hemoglobin  17.1 13.0 - 17.7 g/dL   Hematocrit 06/20/21 29.9 - 51.0 %  POCT rapid strep A     Status: None   Collection Time: 08/06/21  5:45 PM  Result Value Ref Range   Rapid Strep A Screen Negative Negative  Culture, group A strep     Status: None   Collection Time: 08/06/21  5:51 PM   Specimen: Throat  Result Value Ref Range   Specimen Description      THROAT Performed at [facility], [address], [place], [place] [address]    Special Requests      NONE Performed at [facility], [address] [place], [place] [address]    Culture      NO GROUP A STREP (S.PYOGENES) ISOLATED Performed at [facility], [address] [place], [place] [address]    Report Status 08/09/2021 FINAL    .brief  BMET No results for input(s): "NA", "K", "CL", "CO2", "GLUCOSE", "BUN", "CREATININE", "CALCIUM" in the last 72 hours. PT/INR No results for input(s): "LABPROT", "INR" in the last 72 hours. ABG No results for input(s): "PHART", "HCO3" in the last 72 hours.  Invalid input(s): "PCO2", "PO2"   Studies/Results: DG Chest 2 View  Result Date: 08/09/2021 CLINICAL DATA:  Productive cough. EXAM: CHEST - 2 VIEW COMPARISON:  09/01/2004. FINDINGS: The lungs are clear without focal pneumonia, edema, pneumothorax or pleural effusion. Interstitial markings are diffusely coarsened with chronic features. The cardiopericardial silhouette is within normal limits for size. The visualized bony structures of the thorax are unremarkable. IMPRESSION: No active cardiopulmonary disease. Electronically Signed   By: Kennith Center M.D.   On: 08/09/2021 08:55     Procedure:  Testopel implant.  The left upper buttock was prepped with betadine and draped with sterile towels.  The insertion site was chosen and then infiltrated with 25ml of 2% lidocaine without epinepherine.  A small stab wound was made with the 15 blade and the insertion trocar was the passed into the subcutaneous fat.  6 Testopel pellets were then deployed one  at a time.   The trocar was removed and then tincture of benzoine was applied adjacent to the wound which was then was closed with overlapping 1/2 inch steristrips.  A dressing of 4x4's and hypofix was applied and he was then rolled supine where he lay for 5 minutes to apply pressure to the wound.   There were no complications.   Assessment/Plan:  Hypogonadism.  He is doing well on testopel.  H&H and T today and in 3 months.    History of acquired polycythemia.   H&H was 17.1 at last check.  Repeat today.   Meds ordered this encounter  Medications   Testosterone PLLT 450 mg     Orders Placed This Encounter  Procedures   Hemoglobin and hematocrit, blood   Testosterone   Hemoglobin and hematocrit, blood    Standing Status:   Future    Standing Expiration Date:   02/26/2022   Testosterone    Standing Status:   Future    Standing Expiration Date:   02/26/2022     Return in about 3 months (around 11/27/2021) for with labs for testopel reinsertion. Bjorn Pippin 08/28/2021 [phone] Patient ID: Madelin Headings, male   DOB: [birth year], 66 y.o.   MRN: [id number]

## 2021-08-28 ENCOUNTER — Encounter: Payer: Self-pay | Admitting: Urology

## 2021-08-29 LAB — TESTOSTERONE: Testosterone: 793 ng/dL (ref 264–916)

## 2021-08-29 LAB — HEMOGLOBIN AND HEMATOCRIT, BLOOD
Hematocrit: 46.8 % (ref 37.5–51.0)
Hemoglobin: 16.9 g/dL (ref 13.0–17.7)

## 2021-09-29 ENCOUNTER — Ambulatory Visit: Payer: Medicare Other | Admitting: Nurse Practitioner

## 2021-10-09 ENCOUNTER — Encounter: Payer: Self-pay | Admitting: Nurse Practitioner

## 2021-10-09 ENCOUNTER — Ambulatory Visit (INDEPENDENT_AMBULATORY_CARE_PROVIDER_SITE_OTHER): Payer: Commercial Managed Care - PPO | Admitting: Nurse Practitioner

## 2021-10-09 VITALS — BP 105/70 | HR 77 | Ht 71.0 in | Wt 181.0 lb

## 2021-10-09 DIAGNOSIS — Z0000 Encounter for general adult medical examination without abnormal findings: Secondary | ICD-10-CM

## 2021-10-09 DIAGNOSIS — I714 Abdominal aortic aneurysm, without rupture, unspecified: Secondary | ICD-10-CM

## 2021-10-09 DIAGNOSIS — Z9109 Other allergy status, other than to drugs and biological substances: Secondary | ICD-10-CM

## 2021-10-09 DIAGNOSIS — Z0001 Encounter for general adult medical examination with abnormal findings: Secondary | ICD-10-CM | POA: Diagnosis not present

## 2021-10-09 DIAGNOSIS — Z1211 Encounter for screening for malignant neoplasm of colon: Secondary | ICD-10-CM | POA: Insufficient documentation

## 2021-10-09 DIAGNOSIS — K219 Gastro-esophageal reflux disease without esophagitis: Secondary | ICD-10-CM

## 2021-10-09 DIAGNOSIS — E559 Vitamin D deficiency, unspecified: Secondary | ICD-10-CM | POA: Insufficient documentation

## 2021-10-09 DIAGNOSIS — Z1329 Encounter for screening for other suspected endocrine disorder: Secondary | ICD-10-CM

## 2021-10-09 MED ORDER — PANTOPRAZOLE SODIUM 40 MG PO TBEC: DELAYED_RELEASE_TABLET | ORAL | 1 refills | Status: DC

## 2021-10-09 NOTE — Progress Notes (Signed)
Complete physical exam  Patient: Tim Rogers   DOB: March 02, 1955   66 y.o. Male  MRN: [id number]  Subjective:    Chief Complaint  Patient presents with   Annual Exam    cpe    Tim Rogers is a 66 y.o. male with past medical history of AAA, coronary artery calcification, GERD, hypogonadism in male who presents today for a complete physical exam. He generally feels well. He reports sleeping well. He does not have additional problems to discuss today.   Wears prescription glasses has had his eye exam done within the last 1 year.   Due for second dose of shingles vaccine, pneumonia vaccines patient encouraged to get vaccines updated he verbalized understanding  Pt stated that if his lipid panel come back abnormal he is not interested in getting pharmacologic treatment , would rather do diet and exercise.    Most recent fall risk assessment:    10/09/2021    9:06 AM  Fall Risk   Falls in the past year? 0  Number falls in past yr: 0  Injury with Fall? 0  Risk for fall due to : No Fall Risks  Follow up Falls evaluation completed     Most recent depression screenings:    10/09/2021    9:06 AM 05/15/2021   10:07 AM  PHQ 2/9 Scores  PHQ - 2 Score 0 0        Patient Care Team: Donell Beers, FNP as PCP - General (Nurse Practitioner) Jonelle Sidle, MD as PCP - Cardiology (Cardiology)   Outpatient Medications Prior to Visit  Medication Sig Note   amLODipine (NORVASC) 10 MG tablet TAKE 1 TABLET(10 MG) BY MOUTH DAILY    azelastine (ASTELIN) 0.1 % nasal spray Place 1 spray into both nostrils 2 (two) times daily. 10/09/2021: As needed   Cholecalciferol (VITAMIN D-3) 125 MCG (5000 UT) TABS Take 10,000 Units by mouth daily at 12 noon.    hydrocortisone (ANUSOL-HC) 2.5 % rectal cream Place 1 application rectally 2 (two) times daily.    loratadine (CLARITIN) 10 MG tablet Take 10 mg by mouth daily. 10/09/2021: As needed    OVER THE COUNTER MEDICATION AG1 - one  daily    valACYclovir (VALTREX) 1000 MG tablet Take 1,000 mg by mouth 2 (two) times daily.    [DISCONTINUED] pantoprazole (PROTONIX) 40 MG tablet TAKE 1 TABLET BY MOUTH DAILY. TAKE 30 MINUTES BEFORE MEALS    [DISCONTINUED] montelukast (SINGULAIR) 10 MG tablet Take 1 tablet (10 mg total) by mouth at bedtime.    No facility-administered medications prior to visit.    Review of Systems  Constitutional: Negative.   HENT: Negative.    Eyes: Negative.   Respiratory: Negative.    Cardiovascular: Negative.   Gastrointestinal: Negative.   Genitourinary: Negative.   Musculoskeletal: Negative.   Skin: Negative.   Neurological: Negative.   Endo/Heme/Allergies: Negative.   Psychiatric/Behavioral: Negative.            Objective:     BP 105/70 (BP Location: Right Arm, Patient Position: Sitting, Cuff Size: Normal)   Pulse 77   Ht 5\' 11"  (1.803 m)   Wt 181 lb (82.1 kg)   SpO2 96%   BMI 25.24 kg/m    Physical Exam Constitutional:      General: He is not in acute distress.    Appearance: He is not ill-appearing, toxic-appearing or diaphoretic.  HENT:     Head: Normocephalic and atraumatic.     Right  Ear: Tympanic membrane, ear canal and external ear normal. There is no impacted cerumen.     Left Ear: Tympanic membrane, ear canal and external ear normal. There is no impacted cerumen.     Nose: Nose normal. No congestion or rhinorrhea.     Mouth/Throat:     Mouth: Mucous membranes are moist.     Pharynx: Oropharynx is clear. No oropharyngeal exudate or posterior oropharyngeal erythema.  Eyes:     General: No scleral icterus.       Right eye: No discharge.        Left eye: No discharge.     Extraocular Movements: Extraocular movements intact.     Conjunctiva/sclera: Conjunctivae normal.     Pupils: Pupils are equal, round, and reactive to light.  Neck:     Vascular: No carotid bruit.  Cardiovascular:     Rate and Rhythm: Normal rate and regular rhythm.     Pulses: Normal  pulses.     Heart sounds: Normal heart sounds. No murmur heard.    No friction rub. No gallop.  Pulmonary:     Effort: Pulmonary effort is normal. No respiratory distress.     Breath sounds: Normal breath sounds. No stridor. No wheezing, rhonchi or rales.  Chest:     Chest wall: No tenderness.  Abdominal:     General: Abdomen is flat. There is no distension.     Tenderness: There is no abdominal tenderness. There is no right CVA tenderness, left CVA tenderness, guarding or rebound.  Musculoskeletal:        General: No swelling, tenderness, deformity or signs of injury. Normal range of motion.     Cervical back: Normal range of motion and neck supple. No rigidity or tenderness.     Right lower leg: No edema.     Left lower leg: No edema.  Lymphadenopathy:     Cervical: No cervical adenopathy.  Skin:    General: Skin is warm and dry.     Capillary Refill: Capillary refill takes less than 2 seconds.     Coloration: Skin is not jaundiced or pale.     Findings: No bruising, erythema, lesion or rash.  Neurological:     Mental Status: He is alert and oriented to person, place, and time.     Cranial Nerves: No cranial nerve deficit.     Sensory: No sensory deficit.     Motor: No weakness.     Coordination: Coordination normal.     Gait: Gait normal.  Psychiatric:        Mood and Affect: Mood normal.        Behavior: Behavior normal.        Thought Content: Thought content normal.        Judgment: Judgment normal.      No results found for any visits on 10/09/21.     Assessment & Plan:    Routine Health Maintenance and Physical Exam  Immunization History  Administered Date(s) Administered   Fluad Quad(high Dose 65+) 11/12/2019   Influenza Inj Mdck Quad Pf 12/12/2017   Influenza,inj,Quad PF,6+ Mos 12/04/2016, 10/07/2018   Moderna Sars-Covid-2 Vaccination 04/05/2019, 05/04/2019, 12/28/2019   Pneumococcal Conjugate-13 11/19/2014   Pneumococcal Polysaccharide-23 12/04/2016    Tdap 08/20/2019   Zoster Recombinat (Shingrix) 12/04/2016    Health Maintenance  Topic Date Due   Zoster Vaccines- Shingrix (2 of 2) 01/29/2017   COVID-19 Vaccine (4 - Moderna series) 02/22/2020   INFLUENZA VACCINE  09/29/2021   Pneumonia  Vaccine 50+ Years old (3 - PPSV23 or PCV20) 12/04/2021   COLONOSCOPY (Pts 45-74yrs Insurance coverage will need to be confirmed)  11/15/2023   TETANUS/TDAP  08/19/2029   Hepatitis C Screening  Completed   HPV VACCINES  Aged Out    Discussed health benefits of physical activity, and encouraged him to engage in regular exercise appropriate for his age and condition.  Problem List Items Addressed This Visit       Cardiovascular and Mediastinum   AAA (abdominal aortic aneurysm) without rupture (HCC)    BP Readings from Last 3 Encounters:  10/09/21 105/70  08/27/21 112/71  08/09/21 (!) 129/91  On amlodipine 10 mg daily Continue current medication DASH Diet advised engage in regular moderate exercises at least 150 minutes weekly      Relevant Orders   Lipid Profile     Digestive   Chronic GERD    Currently on Protonix 40 mg daily Patient denies abdominal pain, acid reflux excessive bloating I discussed taking Protonix 40 mg daily as needed instead of daily.  If his condition becomes uncontrolled patient told to go back to taking medication daily.  He verbalized understanding Avoid fried fatty, foods, spicy foods      Relevant Medications   pantoprazole (PROTONIX) 40 MG tablet     Other   Environmental allergies    Continue claritin 10mg  daily , azelastine 0.1% , 2 spray daily .       Screening for thyroid disorder    Check TSH      Relevant Orders   TSH   Vitamin D deficiency    Currently on vitamin D 10 units daily Vitamin D levels      Relevant Orders   Vitamin D (25 hydroxy)   Annual physical exam - Primary    Annual exam as documented.  Counseling done include healthy lifestyle involving committing to 150 minutes of  exercise per week, heart healthy diet, and attaining healthy weight. The importance of adequate sleep also discussed.  Regular use of seat belt and home safety were also discussed . Immunization and cancer screening  needs are specifically addressed at this visit.  Due for second dose of shingles vaccine, pneumonia vaccines patient encouraged to get vaccines updated he verbalized understanding      Relevant Orders   HgB A1c   Return in about 1 year (around 10/10/2022) for CPE.     12/10/2022, FNP

## 2021-10-09 NOTE — Assessment & Plan Note (Signed)
Continue claritin 10mg  daily , azelastine 0.1% , 2 spray daily .

## 2021-10-09 NOTE — Assessment & Plan Note (Signed)
Currently on Protonix 40 mg daily Patient denies abdominal pain, acid reflux excessive bloating I discussed taking Protonix 40 mg daily as needed instead of daily.  If his condition becomes uncontrolled patient told to go back to taking medication daily.  He verbalized understanding Avoid fried fatty, foods, spicy foods

## 2021-10-09 NOTE — Patient Instructions (Addendum)
Please get your fasting labs done as dicussed   Please take pantoprazole 40mg  daily as needed as discussed, if your symptoms becomes uncontrolled please take the  medication daily.   It is important that you exercise regularly at least 30 minutes 5 times a week.  Think about what you will eat, plan ahead. Choose " clean, green, fresh or frozen" over canned, processed or packaged foods which are more sugary, salty and fatty. 70 to 75% of food eaten should be vegetables and fruit. Three meals at set times with snacks allowed between meals, but they must be fruit or vegetables. Aim to eat over a 12 hour period , example 7 am to 7 pm, and STOP after  your last meal of the day. Drink water,generally about 64 ounces per day, no other drink is as healthy. Fruit juice is best enjoyed in a healthy way, by EATING the fruit.  Thanks for choosing [facility], we consider it a privelige to serve you.

## 2021-10-09 NOTE — Assessment & Plan Note (Signed)
Check TSH 

## 2021-10-09 NOTE — Assessment & Plan Note (Signed)
Currently on vitamin D 10 units daily Vitamin D levels

## 2021-10-09 NOTE — Assessment & Plan Note (Signed)
Annual exam as documented.  Counseling done include healthy lifestyle involving committing to 150 minutes of exercise per week, heart healthy diet, and attaining healthy weight. The importance of adequate sleep also discussed.  Regular use of seat belt and home safety were also discussed .Marland Kitchen Immunization and cancer screening  needs are specifically addressed at this visit.  Due for second dose of shingles vaccine, pneumonia vaccines patient encouraged to get vaccines updated he verbalized understanding

## 2021-10-09 NOTE — Progress Notes (Deleted)
   Tim Rogers     MRN: [id number]      DOB: [birth year]   HPI Tim Rogers is here for follow up and re-evaluation of chronic medical conditions, medication management and review of any available recent lab and radiology data.  Preventive health is updated, specifically  Cancer screening and Immunization.   Questions or concerns regarding consultations or procedures which the PT has had in the interim are  addressed. The PT denies any adverse reactions to current medications since the last visit.  There are no new concerns.  There are no specific complaints    GERD. Well controlled       ROS Denies recent fever or chills. Denies sinus pressure, nasal congestion, ear pain or sore throat. Denies chest congestion, productive cough or wheezing. Denies chest pains, palpitations and leg swelling Denies abdominal pain, nausea, vomiting,diarrhea or constipation.   Denies dysuria, frequency, hesitancy or incontinence. Denies joint pain, swelling and limitation in mobility. Denies headaches, seizures, numbness, or tingling. Denies depression, anxiety or insomnia. Denies skin break down or rash.   PE  BP 105/70 (BP Location: Right Arm, Patient Position: Sitting, Cuff Size: Normal)   Pulse 77   Ht 5\' 11"  (1.803 m)   Wt 181 lb (82.1 kg)   SpO2 96%   BMI 25.24 kg/m   Patient alert and oriented and in no cardiopulmonary distress.  HEENT: No facial asymmetry, EOMI,     Neck supple .  Chest: Clear to auscultation bilaterally.  CVS: S1, S2 no murmurs, no S3.Regular rate.  ABD: Soft non tender.   Ext: No edema  MS: Adequate ROM spine, shoulders, hips and knees.  Skin: Intact, no ulcerations or rash noted.  Psych: Good eye contact, normal affect. Memory intact not anxious or depressed appearing.  CNS: CN 2-12 intact, power,  normal throughout.no focal deficits noted.   Assessment & Plan  ***

## 2021-10-09 NOTE — Assessment & Plan Note (Addendum)
BP Readings from Last 3 Encounters:  10/09/21 105/70  08/27/21 112/71  08/09/21 (!) 129/91  On amlodipine 10 mg daily Continue current medication DASH Diet advised engage in regular moderate exercises at least 150 minutes weekly

## 2021-10-19 ENCOUNTER — Ambulatory Visit (INDEPENDENT_AMBULATORY_CARE_PROVIDER_SITE_OTHER): Payer: Commercial Managed Care - PPO

## 2021-10-19 DIAGNOSIS — Z0000 Encounter for general adult medical examination without abnormal findings: Secondary | ICD-10-CM | POA: Diagnosis not present

## 2021-10-19 NOTE — Patient Instructions (Addendum)
  Tim Rogers , Thank you for taking time to come for your Medicare Wellness Visit. I appreciate your ongoing commitment to your health goals. Please review the following plan we discussed and let me know if I can assist you in the future.   You need a 2nd dose of the shingrix vaccine  These are the goals we discussed:  Goals      Patient Stated     Keep brain active and healthy  Stay as active as possible         This is a list of the screening recommended for you and due dates:  Health Maintenance  Topic Date Due   Zoster (Shingles) Vaccine (2 of 2) 01/29/2017   COVID-19 Vaccine (4 - Moderna series) 02/22/2020   Flu Shot  09/29/2021   Pneumonia Vaccine (3 - PPSV23 or PCV20) 12/04/2021   Colon Cancer Screening  11/15/2023   Tetanus Vaccine  08/19/2029   Hepatitis C Screening: USPSTF Recommendation to screen - Ages 18-79 yo.  Completed   HPV Vaccine  Aged Out

## 2021-10-19 NOTE — Progress Notes (Signed)
I connected with  Tim Rogers on 10/19/21 by a audio enabled telemedicine application and verified that I am speaking with the correct person using two identifiers.  Patient Location: Home  Provider Location: Office/Clinic  I discussed the limitations of evaluation and management by telemedicine. The patient expressed understanding and agreed to proceed.  Subjective:   Tim Rogers is a 66 y.o. male who presents for Medicare Annual/Subsequent preventive examination.  Review of Systems     Cardiac Risk Factors include: male gender;advanced age (>73men, >71 women)     Objective:    There were no vitals filed for this visit. There is no height or weight on file to calculate BMI.     10/19/2021    8:23 AM 08/09/2021    7:19 AM 08/21/2020    8:46 AM 08/20/2019    8:04 AM 09/21/2017    1:26 PM  Advanced Directives  Does Patient Have a Medical Advance Directive? No No No;Yes Yes Yes  Type of Advance Directive   Living will Living will Living will  Does patient want to make changes to medical advance directive?   No - Patient declined No - Patient declined   Copy of Healthcare Power of Attorney in Chart?     No - copy requested  Would patient like information on creating a medical advance directive? Yes (ED - Information included in AVS) No - Patient declined No - Patient declined      Current Medications (verified) Outpatient Encounter Medications as of 10/19/2021  Medication Sig   amLODipine (NORVASC) 10 MG tablet TAKE 1 TABLET(10 MG) BY MOUTH DAILY   azelastine (ASTELIN) 0.1 % nasal spray Place 1 spray into both nostrils 2 (two) times daily.   Cholecalciferol (VITAMIN D-3) 125 MCG (5000 UT) TABS Take 10,000 Units by mouth daily at 12 noon.   hydrocortisone (ANUSOL-HC) 2.5 % rectal cream Place 1 application rectally 2 (two) times daily.   loratadine (CLARITIN) 10 MG tablet Take 10 mg by mouth daily.   OVER THE COUNTER MEDICATION AG1 - one daily   pantoprazole (PROTONIX)  40 MG tablet Take pantoprazole 40mg  daily as needed for acid reflux   valACYclovir (VALTREX) 1000 MG tablet Take 1,000 mg by mouth 2 (two) times daily.   No facility-administered encounter medications on file as of 10/19/2021.    Allergies (verified) Patient has no known allergies.   History: Past Medical History:  Diagnosis Date   Acid reflux    Anxiety    2003   Ascending aortic aneurysm (HCC)    GERD (gastroesophageal reflux disease)    Low testosterone    PVCs (premature ventricular contractions)    Past Surgical History:  Procedure Laterality Date   CHOLECYSTECTOMY     ESOPHAGOGASTRODUODENOSCOPY N/A 09/21/2017   Procedure: ESOPHAGOGASTRODUODENOSCOPY (EGD);  Surgeon: 09/23/2017, MD;  [facility];  Service: Endoscopy;  Laterality: N/A;  2:55   TIF procedure     Family History  Problem Relation Age of Onset   Alcohol abuse Mother    Cancer Mother        lung   COPD Mother    Heart disease Mother    Early death Mother        age 34 multiple factors   Cancer Sister        breast   Social History   Socioeconomic History   Marital status: Single    Spouse name: Not on file   Number of children: 0  Years of education: 78   Highest education level: Not on file  Occupational History   Occupation: retired  Tobacco Use   Smoking status: Never   Smokeless tobacco: Former    Types: Snuff  Vaping Use   Vaping Use: Never used  Substance and Sexual Activity   Alcohol use: Yes    Alcohol/week: 5.0 standard drinks of alcohol    Types: 5 Cans of beer per week    Comment: couple of beer sometimes daily   Drug use: No   Sexual activity: Not Currently    Birth control/protection: None  Other Topics Concern   Not on file  Social History Narrative   Retired.Single.Lives alone.   Ex-[employer].   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   Social Determinants of Manufacturing engineer Strain: Not on [employer] Insecurity: Not on file  Transportation Needs: No Transportation Needs (10/19/2021)   PRAPARE - Administrator, Civil Service (Medical): No    Lack of Transportation (Non-Medical): No  Physical Activity: Sufficiently Active (10/19/2021)   Exercise Vital Sign    Days of Exercise per Week: 4 days    Minutes of Exercise per Session: 90 min  Stress: Not on file  Social Connections: Not on file    Tobacco Counseling Counseling given: Not Answered   Clinical Intake:  Pre-visit preparation completed: No              Diabetic?no         Activities of Daily Living     No data to display          Patient Care Team: Donell Beers, FNP as PCP - General (Nurse Practitioner) Jonelle Sidle, MD as PCP - Cardiology (Cardiology)  Indicate any recent Medical Services you may have received from other than [facility] providers in the past year (date may be approximate).     Assessment:   This is a routine wellness examination for Tim Rogers.  Hearing/Vision screen No results found.  Dietary issues and exercise activities discussed:     Goals Addressed   None    Depression Screen    10/09/2021    9:06 AM 05/15/2021   10:07 AM 08/21/2020    8:32 AM 08/20/2019    8:09 AM 05/16/2019    8:47 AM 10/01/2016    8:31 AM  PHQ 2/9 Scores  PHQ - 2 Score 0 0 0 0 0 0  PHQ- 9 Score   0     Exception Documentation     Medical reason     Fall Risk    10/09/2021    9:06 AM 05/15/2021   10:07 AM 08/21/2020    8:33 AM 08/20/2019    8:05 AM 05/16/2019    8:46 AM  Fall Risk   Falls in the past year? 0 0 0 1 0  Number falls in past yr: 0 0  0 0  Injury with Fall? 0 0  0 0  Risk for fall due to : No Fall Risks No Fall Risks  History of fall(s) No Fall Risks  Follow up Falls evaluation completed Falls evaluation completed  Education provided;Falls prevention discussed Falls evaluation completed    FALL RISK PREVENTION  PERTAINING TO THE HOME:  Any stairs in or around the home? No  If so, are there any without handrails? No  Home  free of loose throw rugs in walkways, pet beds, electrical cords, etc? Yes  Adequate lighting in your home to reduce risk of falls? Yes   ASSISTIVE DEVICES UTILIZED TO PREVENT FALLS:  Life alert? No  Use of a cane, walker or w/c? No  Grab bars in the bathroom? No  Shower chair or bench in shower? No  Elevated toilet seat or a handicapped toilet? No    Cognitive Function:        10/19/2021    8:27 AM 08/21/2020    8:48 AM 08/20/2019    8:13 AM  6CIT Screen  What Year? 0 points 0 points 0 points  What month? 0 points 0 points 0 points  What time? 0 points 0 points 0 points  Count back from 20 0 points 0 points 0 points  Months in reverse 0 points 0 points 0 points  Repeat phrase 0 points 2 points 2 points  Total Score 0 points 2 points 2 points    Immunizations Immunization History  Administered Date(s) Administered   Fluad Quad(high Dose 65+) 11/12/2019   Influenza Inj Mdck Quad Pf 12/12/2017   Influenza,inj,Quad PF,6+ Mos 12/04/2016, 10/07/2018   Moderna Sars-Covid-2 Vaccination 04/05/2019, 05/04/2019, 12/28/2019   Pneumococcal Conjugate-13 11/19/2014   Pneumococcal Polysaccharide-23 12/04/2016   Tdap 08/20/2019   Zoster Recombinat (Shingrix) 12/04/2016    TDAP status: Up to date  Flu Vaccine status: Up to date  Pneumococcal vaccine status: Up to date  Covid-19 vaccine status: Completed vaccines  Qualifies for Shingles Vaccine? Yes   Zostavax completed No   Shingrix Completed?: Yes  Screening Tests Health Maintenance  Topic Date Due   Zoster Vaccines- Shingrix (2 of 2) 01/29/2017   COVID-19 Vaccine (4 - Moderna series) 02/22/2020   INFLUENZA VACCINE  09/29/2021   Pneumonia Vaccine 96+ Years old (3 - PPSV23 or PCV20) 12/04/2021   COLONOSCOPY (Pts 45-68yrs Insurance coverage will need to be confirmed)  11/15/2023   TETANUS/TDAP  08/19/2029    Hepatitis C Screening  Completed   HPV VACCINES  Aged Out    Health Maintenance  Health Maintenance Due  Topic Date Due   Zoster Vaccines- Shingrix (2 of 2) 01/29/2017   COVID-19 Vaccine (4 - Moderna series) 02/22/2020   INFLUENZA VACCINE  09/29/2021   Pneumonia Vaccine 77+ Years old (3 - PPSV23 or PCV20) 12/04/2021    Colorectal cancer screening: Type of screening: Cologuard. Completed 2015. Repeat every 10 years  Lung Cancer Screening: (Low Dose CT Chest recommended if Age 42-80 years, 30 pack-year currently smoking OR have quit w/in 15years.) does not qualify.   Lung Cancer Screening Referral: na  Additional Screening:  Hepatitis C Screening: does qualify; Completed   Vision Screening: Recommended annual ophthalmology exams for early detection of glaucoma and other disorders of the eye. Is the patient up to date with their annual eye exam?  Yes  Who is the provider or what is the name of the office in which the patient attends annual eye exams? [email or web address] If pt is not established with a provider, would they like to be referred to a provider to establish care? No .   Dental Screening: Recommended annual dental exams for proper oral hygiene  Community Resource Referral / Chronic Care Management: CRR required this visit?  No   CCM required this visit?  No      Plan:     I have personally reviewed and noted the following in the patient's chart:   Medical and social history Use  of alcohol, tobacco or illicit drugs  Current medications and supplements including opioid prescriptions. Patient is not currently taking opioid prescriptions. Functional ability and status Nutritional status Physical activity Advanced directives List of other physicians Hospitalizations, surgeries, and ER visits in previous 12 months Vitals Screenings to include cognitive, depression, and falls Referrals and appointments  In addition, I have reviewed and discussed with patient certain  preventive protocols, quality metrics, and best practice recommendations. A written personalized care plan for preventive services as well as general preventive health recommendations were provided to patient.     Abner Greenspan, LPN   08/28/4763   Nurse Notes: Schedule your next wellness visit for 1 year at checkout

## 2021-10-30 ENCOUNTER — Other Ambulatory Visit: Payer: Self-pay | Admitting: Family Medicine

## 2021-10-30 NOTE — Telephone Encounter (Signed)
Urgent Care patient Requested Prescriptions  Pending Prescriptions Disp Refills  . montelukast (SINGULAIR) 10 MG tablet [Pharmacy Med Name: MONTELUKAST 10MG  TABLETS] 30 tablet 2    Sig: TAKE 1 TABLET(10 MG) BY MOUTH AT BEDTIME     There is no refill protocol information for this order

## 2021-11-06 ENCOUNTER — Telehealth (INDEPENDENT_AMBULATORY_CARE_PROVIDER_SITE_OTHER): Payer: Self-pay | Admitting: *Deleted

## 2021-11-06 ENCOUNTER — Other Ambulatory Visit (INDEPENDENT_AMBULATORY_CARE_PROVIDER_SITE_OTHER): Payer: Self-pay | Admitting: Gastroenterology

## 2021-11-06 DIAGNOSIS — K219 Gastro-esophageal reflux disease without esophagitis: Secondary | ICD-10-CM

## 2021-11-06 NOTE — Telephone Encounter (Signed)
Patient last seen sept  and wanted to schedule his yearly appt. Please schedule appt so he can get refills.   [phone]

## 2021-11-20 ENCOUNTER — Telehealth: Payer: Self-pay | Admitting: Cardiology

## 2021-11-20 MED ORDER — AMLODIPINE BESYLATE 10 MG PO TABS
ORAL_TABLET | ORAL | 0 refills | Status: DC
Start: 2021-11-20 — End: 2022-04-16

## 2021-11-20 NOTE — Telephone Encounter (Signed)
*  STAT* If patient is at the pharmacy, call can be transferred to refill team.   1. Which medications need to be refilled? (please list name of each medication and dose if known) amLODipine (NORVASC) 10 MG tablet  2. Which pharmacy/location (including street and city if local pharmacy) is medication to be sent to?    [facility] [place], [address]  3. Do they need a 30 day or 90 day supply? 80    Pt made a f/u for 01/05/22

## 2021-11-26 ENCOUNTER — Other Ambulatory Visit: Payer: Medicare Other

## 2021-11-27 LAB — HEMOGLOBIN AND HEMATOCRIT, BLOOD
Hematocrit: 48.3 % (ref 37.5–51.0)
Hemoglobin: 17.3 g/dL (ref 13.0–17.7)

## 2021-11-27 LAB — TESTOSTERONE: Testosterone: 812 ng/dL (ref 264–916)

## 2021-12-02 ENCOUNTER — Other Ambulatory Visit: Payer: Self-pay | Admitting: Family Medicine

## 2021-12-03 ENCOUNTER — Encounter: Payer: Self-pay | Admitting: Urology

## 2021-12-03 ENCOUNTER — Ambulatory Visit (INDEPENDENT_AMBULATORY_CARE_PROVIDER_SITE_OTHER): Payer: Medicare Other | Admitting: Urology

## 2021-12-03 VITALS — BP 119/70 | HR 69

## 2021-12-03 DIAGNOSIS — N400 Enlarged prostate without lower urinary tract symptoms: Secondary | ICD-10-CM | POA: Diagnosis not present

## 2021-12-03 DIAGNOSIS — I2584 Coronary atherosclerosis due to calcified coronary lesion: Secondary | ICD-10-CM | POA: Diagnosis not present

## 2021-12-03 DIAGNOSIS — I2510 Atherosclerotic heart disease of native coronary artery without angina pectoris: Secondary | ICD-10-CM | POA: Diagnosis not present

## 2021-12-03 DIAGNOSIS — D751 Secondary polycythemia: Secondary | ICD-10-CM

## 2021-12-03 DIAGNOSIS — Z125 Encounter for screening for malignant neoplasm of prostate: Secondary | ICD-10-CM | POA: Diagnosis not present

## 2021-12-03 DIAGNOSIS — E291 Testicular hypofunction: Secondary | ICD-10-CM | POA: Diagnosis not present

## 2021-12-03 LAB — URINALYSIS, ROUTINE W REFLEX MICROSCOPIC
Bilirubin, UA: NEGATIVE
Glucose, UA: NEGATIVE
Ketones, UA: NEGATIVE
Leukocytes,UA: NEGATIVE
Nitrite, UA: NEGATIVE
Protein,UA: NEGATIVE
RBC, UA: NEGATIVE
Specific Gravity, UA: 1.015 (ref 1.005–1.030)
Urobilinogen, Ur: 0.2 mg/dL (ref 0.2–1.0)
pH, UA: 7.5 (ref 5.0–7.5)

## 2021-12-03 NOTE — Progress Notes (Signed)
Subjective: 1. Hypogonadism in male   2. Secondary polycythemia   3. Encounter for screening for malignant neoplasm of prostate   4. Benign prostatic hyperplasia without lower urinary tract symptoms     12/03/21: Tim Rogers returns today in f/u for his history noted below.  His T is 812 and his Hgb is 17.3.  He hasn't had to donate blood in 9 months.   His libido remains low but his energy is good.  He has good erectile function.  He is voiding well with no nocturia.    08/27/21: Tim Rogers returns today for testopel insertion for his history noted below.  His last labs were in 06/18/21 and his T was 765 and Hgb was 17.1.   05/14/21: Tim Rogers returns today in f/u for his history of hypogonadism and secondary polycythemia.   He had Testopel on 12/22 and his T is 628 with a Hgb of 17.7 and Hct 51.6%.  He is doing well on treatment but didn't get the "kick" that he got with the injections.  His PSA is 1.1.    02/19/21: Tim Rogers returns today in f/u for a testopel implant.  His Hgb is down to normal after two phlebotomies.    11/27/20: Tim Rogers returns today in f/u.  He reports he is doing well and the discomfort with ejaculation has improved and is minimal.  A renal stone study shows some hepatic cysts and prostate stones but no other significant findings.  His IPSS is 0 and his UA is clear.  He remains on TRT with topicals but was interested in testopel.  He does report a prior history of elevated Hemoglobins with TRT.    Gu Hx: Tim Rogers is a 66 yo male who is sent for evaluation of perineal pain.  He got an antibiotic for 5 days and his symptoms recurred.  He got another 7 days but the symptoms recurred.  He has now been on the bactrim for 7 weeks.  He has no further pain with ejaculation but he still can have some suprapubic discomfort.  He has increased symptoms with tea or diet coke.  Beer will also impact it.  He is voiding well with an IPSS of 1.  He has minimal change in his bowel habits.   He has had no documented stones or  GU surgery.  He thinks he may have passed a stone 4 weeks ago which he had a sharp burning at the tip of the penis x 1. He had a CT AP in 2006 and there were no stones.  He had UA's on 5/24 and 7/13 that had 0-2 RBC's on one.  He has 0-2 RBC's today.   HIs PSA was 1.1 on 05/14/20.  He is on TRT currently with a topical but he had been getting [facility].  He takes a lot of Vitamins.  His most recent T is about 1000.   ROS:  ROS  No Known Allergies  Past Medical History:  Diagnosis Date   Acid reflux    Anxiety    2003   Ascending aortic aneurysm (HCC)    GERD (gastroesophageal reflux disease)    Low testosterone    PVCs (premature ventricular contractions)     Past Surgical History:  Procedure Laterality Date   CHOLECYSTECTOMY     ESOPHAGOGASTRODUODENOSCOPY N/A 09/21/2017   Procedure: ESOPHAGOGASTRODUODENOSCOPY (EGD);  Surgeon: Malissa Hippo, MD;  [facility];  Service: Endoscopy;  Laterality: N/A;  2:55   TIF procedure  Social History   Socioeconomic History   Marital status: Single    Spouse name: Not on file   Number of children: 0   Years of education: 18   Highest education level: Not on file  Occupational History   Occupation: retired  Tobacco Use   Smoking status: Never   Smokeless tobacco: Former    Types: Snuff  Vaping Use   Vaping Use: Never used  Substance and Sexual Activity   Alcohol use: Yes    Alcohol/week: 5.0 standard drinks of alcohol    Types: 5 Cans of beer per week    Comment: couple of beer sometimes daily   Drug use: No   Sexual activity: Not Currently    Birth control/protection: None  Other Topics Concern   Not on file  Social History Narrative   Retired.Single.Lives alone.   Ex-[employer].   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   Social Determinants of Corporate investment banker Strain: Not on [employer] Insecurity:  Not on file  Transportation Needs: No Transportation Needs (10/19/2021)   PRAPARE - Administrator, Civil Service (Medical): No    Lack of Transportation (Non-Medical): No  Physical Activity: Sufficiently Active (10/19/2021)   Exercise Vital Sign    Days of Exercise per Week: 4 days    Minutes of Exercise per Session: 90 min  Stress: Not on file  Social Connections: Not on file  Intimate Partner Violence: Not on file    Family History  Problem Relation Age of Onset   Alcohol abuse Mother    Cancer Mother        lung   COPD Mother    Heart disease Mother    Early death Mother        age 9 multiple factors   Cancer Sister        breast    Anti-infectives: Anti-infectives (From admission, onward)    None       Current Outpatient Medications  Medication Sig Dispense Refill   amLODipine (NORVASC) 10 MG tablet TAKE 1 TABLET(10 MG) BY MOUTH DAILY 90 tablet 0   azelastine (ASTELIN) 0.1 % nasal spray Place 1 spray into both nostrils 2 (two) times daily. 30 mL 3   Cholecalciferol (VITAMIN D-3) 125 MCG (5000 UT) TABS Take 10,000 Units by mouth daily at 12 noon.     hydrocortisone (ANUSOL-HC) 2.5 % rectal cream Place 1 application rectally 2 (two) times daily. 28 g 2   loratadine (CLARITIN) 10 MG tablet Take 10 mg by mouth daily.     OVER THE COUNTER MEDICATION AG1 - one daily     pantoprazole (PROTONIX) 40 MG tablet Take pantoprazole 40mg  daily as needed for acid reflux 90 tablet 1   valACYclovir (VALTREX) 1000 MG tablet Take 1,000 mg by mouth 2 (two) times daily.     No current facility-administered medications for this visit.     Objective: Vital signs in last 24 hours: BP 119/70   Pulse 69   Intake/Output from previous day: No intake/output data recorded. Intake/Output this shift: @IOTHISSHIFT @   Physical Exam  Lab Results:  Recent Results (from the past 2160 hour(s))  Testosterone     Status: None   Collection Time: 11/26/21  9:54 AM  Result Value  Ref Range   Testosterone 812 264 - 916 ng/dL    Comment: Adult male  reference interval is based on a population of healthy nonobese males (BMI <30) between 44 and 30 years old. [place], [place] [phone]. PMID: [id number].   Hemoglobin and hematocrit, blood     Status: None   Collection Time: 11/26/21  9:54 AM  Result Value Ref Range   Hemoglobin 17.3 13.0 - 17.7 g/dL   Hematocrit 48.3 37.5 - 51.0 %  Urinalysis, Routine w reflex microscopic     Status: None   Collection Time: 12/03/21 11:04 AM  Result Value Ref Range   Specific Gravity, UA 1.015 1.005 - 1.030   pH, UA 7.5 5.0 - 7.5   Color, UA Yellow Yellow   Appearance Ur Clear Clear   Leukocytes,UA Negative Negative   Protein,UA Negative Negative/Trace   Glucose, UA Negative Negative   Ketones, UA Negative Negative   RBC, UA Negative Negative   Bilirubin, UA Negative Negative   Urobilinogen, Ur 0.2 0.2 - 1.0 mg/dL   Nitrite, UA Negative Negative   .brief  BMET No results for input(s): "NA", "K", "CL", "CO2", "GLUCOSE", "BUN", "CREATININE", "CALCIUM" in the last 72 hours. PT/INR No results for input(s): "LABPROT", "INR" in the last 72 hours. ABG No results for input(s): "PHART", "HCO3" in the last 72 hours.  Invalid input(s): "PCO2", "PO2"   Studies/Results: No results found.   Procedure:  Testopel implant.  The left upper buttock was prepped with betadine and draped with sterile towels.  The insertion site was chosen and then infiltrated with 72ml of 2% lidocaine without epinepherine.  A small stab wound was made with the 15 blade and the insertion trocar was the passed into the subcutaneous fat.  6 Testopel pellets were then deployed one at a time.   The trocar was removed and then tincture of benzoine was applied adjacent to the wound which was then was closed with overlapping 1/2 inch steristrips.  A dressing of 4x4's and hypofix was applied and he was then rolled supine where he lay for 5 minutes to apply  pressure to the wound.   There were no complications.   Assessment/Plan:  Hypogonadism.  He is doing well on testopel.  H&H and T today and in 3 months.    History of acquired polycythemia.   H&H was 17.1 at last check.  Repeat today.   No orders of the defined types were placed in this encounter.    Orders Placed This Encounter  Procedures   Urinalysis, Routine w reflex microscopic   Testosterone    Standing Status:   Future    Standing Expiration Date:   06/04/2022   Hemoglobin and hematocrit, blood    Standing Status:   Future    Standing Expiration Date:   06/04/2022   PSA    Standing Status:   Future    Standing Expiration Date:   06/04/2022     Return in about 3 months (around 03/05/2022).        Tim Rogers 12/04/2021 [phone] Patient ID: Tim Rogers, male   DOB: [birth year], 66 y.o.   MRN: [id number]

## 2022-01-04 ENCOUNTER — Encounter (INDEPENDENT_AMBULATORY_CARE_PROVIDER_SITE_OTHER): Payer: Self-pay | Admitting: Gastroenterology

## 2022-01-04 ENCOUNTER — Ambulatory Visit (INDEPENDENT_AMBULATORY_CARE_PROVIDER_SITE_OTHER): Payer: Medicare Other | Admitting: Gastroenterology

## 2022-01-04 DIAGNOSIS — K219 Gastro-esophageal reflux disease without esophagitis: Secondary | ICD-10-CM | POA: Diagnosis not present

## 2022-01-04 MED ORDER — PANTOPRAZOLE SODIUM 20 MG PO TBEC: 20.0000 mg | DELAYED_RELEASE_TABLET | Freq: Every day | ORAL | 3 refills | Status: DC

## 2022-01-04 NOTE — Patient Instructions (Signed)
We will decrease pantoprazole to 20mg  daily, please let me know if GERD is not well controlled on this Will plan for repeat colonoscopy in July 2024 for colon cancer screening  Follow up 1 year

## 2022-01-04 NOTE — Progress Notes (Unsigned)
Cardiology Office Note  Date: 01/05/2022   ID: PRANAV LINCE, DOB Mar 07, 1955, MRN [id number]  PCP:  Johnette Abraham, MD  Cardiologist:  Rozann Lesches, MD Electrophysiologist:  None   Chief Complaint  Patient presents with   Cardiac follow-up    History of Present Illness: Tim Rogers is a 66 y.o. male last seen in October 2022.  He is here for a routine visit.  Continues to do well, no exertional chest discomfort or shortness of breath beyond NYHA class I.  Still goes to the gym 3 days a week, playing golf regularly.  Chest CTA from November of last year showed stable ascending thoracic aortic aneurysm measuring 4.4 cm with dilated aortic root of 4.6 cm.  Annual screening recommended.  We discussed these results today and plan for follow-up imaging.  I personally reviewed his ECG today which shows sinus rhythm with low voltage.  We went over his medications.  Blood pressure is well controlled today.  Past Medical History:  Diagnosis Date   Acid reflux    Anxiety    2003   Ascending aortic aneurysm (HCC)    GERD (gastroesophageal reflux disease)    Low testosterone    PVCs (premature ventricular contractions)     Past Surgical History:  Procedure Laterality Date   CHOLECYSTECTOMY     ESOPHAGOGASTRODUODENOSCOPY N/A 09/21/2017   Procedure: ESOPHAGOGASTRODUODENOSCOPY (EGD);  Surgeon: Rogene Houston, MD;  [facility];  Service: Endoscopy;  Laterality: N/A;  2:55   TIF procedure      Current Outpatient Medications  Medication Sig Dispense Refill   amLODipine (NORVASC) 10 MG tablet TAKE 1 TABLET(10 MG) BY MOUTH DAILY 90 tablet 0   azelastine (ASTELIN) 0.1 % nasal spray Place 1 spray into both nostrils 2 (two) times daily. 30 mL 3   Cholecalciferol (VITAMIN D-3) 125 MCG (5000 UT) TABS Take 10,000 Units by mouth daily at 12 noon.     hydrocortisone (ANUSOL-HC) 2.5 % rectal cream Place 1 application rectally 2 (two) times daily. 28 g 2   loratadine  (CLARITIN) 10 MG tablet Take 10 mg by mouth daily.     OVER THE COUNTER MEDICATION AG1 - one daily     pantoprazole (PROTONIX) 20 MG tablet Take 1 tablet (20 mg total) by mouth daily. 90 tablet 3   valACYclovir (VALTREX) 1000 MG tablet Take 1,000 mg by mouth 2 (two) times daily.     No current facility-administered medications for this visit.   Allergies:  Patient has no known allergies.   ROS: No palpitations or sudden syncope.  Physical Exam: VS:  BP 104/80   Pulse 66   Ht 5\' 11"  (1.803 m)   Wt 182 lb 6.4 oz (82.7 kg)   SpO2 98%   BMI 25.44 kg/m , BMI Body mass index is 25.44 kg/m.  Wt Readings from Last 3 Encounters:  01/05/22 182 lb 6.4 oz (82.7 kg)  01/04/22 180 lb (81.6 kg)  10/09/21 181 lb (82.1 kg)    General: Patient appears comfortable at rest. HEENT: Conjunctiva and lids normal. Neck: Supple, no elevated JVP or carotid bruits. Lungs: Clear to auscultation, nonlabored breathing at rest. Cardiac: Regular rate and rhythm, no S3 or significant systolic murmur, no pericardial rub. Abdomen: Soft, nontender, bowel sounds present. Extremities: No pitting edema.  ECG:  An ECG dated 12/10/2020 was personally reviewed today and demonstrated:  Sinus rhythm with leftward axis.  Recent Labwork: 01/06/2021: Platelets 253 05/15/2021: ALT 26; AST 21; BUN  19; Creatinine, Ser 1.02; Potassium 4.3; Sodium 140 11/26/2021: Hemoglobin 17.3   Other Studies Reviewed Today:  Chest CTA 01/08/2021: IMPRESSION: Vascular:   Fusiform aneurysm of the ascending thoracic aorta measuring up to 4.4 cm. Aneurysmal change of the sinuses of Valsalva, measuring up to 4.6 cm. Recommend annual imaging followup by CTA or MRA. This recommendation follows 2010 ACCF/AHA/AATS/ACR/ASA/SCA/SCAI/SIR/STS/SVM Guidelines for the Diagnosis and Management of Patients with Thoracic Aortic Disease. Circulation. 2010; 121: H962-I297. Aortic aneurysm NOS (ICD10-I71.9)   Non-Vascular:   1. No acute intrathoracic  abnormality. 2. Multifocal simple hepatic cysts.  Assessment and Plan:  1.  Asymptomatic ascending aortic aneurysm, 4.6 cm at the root and 4.4 cm ascending segment.  This has been relatively stable over time and we plan to continue annual imaging.  His blood pressure is well controlled on Norvasc.  Heart rate in the 60s at rest so not on beta-blocker therapy.  2.  Mild aortic regurgitation by echocardiogram in 2021, no significant murmur on examination.  Aortic valve trileaflet.  3.  Essential hypertension, blood pressure well controlled today on Norvasc.  No changes were made.  Medication Adjustments/Labs and Tests Ordered: Current medicines are reviewed at length with the patient today.  Concerns regarding medicines are outlined above.   Tests Ordered: Orders Placed This Encounter  Procedures   CT ANGIO CHEST AORTA W &/OR WO CONTRAST   Basic metabolic panel   EKG 12-Lead    Medication Changes: No orders of the defined types were placed in this encounter.   Disposition:  Follow up  1 year.  Signed, Jonelle Sidle, MD, [facility] 01/05/2022 8:41 AM    [facility] at [facility] [address] [place], [place] [address] Phone: [phone]; Fax: [phone]

## 2022-01-04 NOTE — Progress Notes (Addendum)
Primary Care Physician:  Billie Lade, MD  Primary GI: Levon Hedger   Patient Location: Home   Provider Location: [facility]   Reason for Visit: GERD follow up   Persons present on the virtual encounter, with roles: Holston Oyama L. Jeanmarie Hubert, MSN, APRN, AGNP-C, [place], Patient    Total time (minutes) spent on medical discussion:16 minutes  Virtual Visit via Telephone  visit is conducted virtually and was requested by patient.   I connected with Madelin Headings on 01/04/22 at  3:50 PM EST by telephone and verified that I am speaking with the correct person using two identifiers.   I discussed the limitations, risks, security and privacy concerns of performing an evaluation and management service by telephone and the availability of in person appointments. I also discussed with the patient that there may be a patient responsible charge related to this service. The patient expressed understanding and agreed to proceed.  Chief Complaint  Patient presents with   Gastroesophageal Reflux    Patient doing a virtual visit - phone call today to Follow up on GERD. Takes protonix. Reports med sometimes makes his stomach feel funny. It does a good job controlling GERD symptoms.    History of Present Illness: Tim Rogers is a 66 y.o. male with past medical history of acid reflux, anxiety, ascending aortic aneurysm, low testosterone and PVCs.    Last seen September 2022, at that time, on protonix 40mg  daily. Reports he occasionally belches and may have an infrequent episode of acid regurgitation, however, this is rare. Taking medication in the morning before eating and he feels reflux symptoms are well managed at this time. Had some previous flatulence/gas pains but had started a probiotic which helped and doing some yoga moves and gas-x as well, 1-2 BMs per day, taking metamucil with good results. Did endorse some burning/irritation of his anal area, thinks he has some  hemorrhoids    Recommended to continue ppi daily, annusol cream QID, sitz baths, continue probiotic and metamucil, plan for colonoscopy in 2024.  Present:  States that GERD is well managed. Has no dysphagia or odynophagia. sometimes feels that protonix makes his stomach feel somewhat unsettled though reiterates that this is occasional and is not severe. He does note he decreased alcohol intake to a couple of times per month now vs having a few beers per day. He denies nausea or vomiting. Appetite is good, weight is stable. He inquired about other alternatives to PPI therapy due to concern for side effects, would like to try decreased PPI dosing. States he had the TIF procedure in the past in [place] that failed. He does have frequent chiropractic adjustments that he feels helps with his GERD. Denies rectal bleeding, melena, constipation or diarrhea.   Last Colonoscopy:(08/2012)-Dr. Bloom-2 sessile polyps descending and sigmoid. Small internal and external hemorrhoids, pathology hyperplastic Last Endoscopy:(2019) normal esophagus, z line irregular 39 cm from incisors, 2cm HH, suture at fundus from previous surgery, normal duodenal bulb and second portion of duodenum, no specimens collected.   Past Medical History:  Diagnosis Date   Acid reflux    Anxiety    2003   Ascending aortic aneurysm (HCC)    GERD (gastroesophageal reflux disease)    Low testosterone    PVCs (premature ventricular contractions)      Past Surgical History:  Procedure Laterality Date   CHOLECYSTECTOMY     ESOPHAGOGASTRODUODENOSCOPY N/A 09/21/2017   Procedure: ESOPHAGOGASTRODUODENOSCOPY (EGD);  Surgeon: 09/23/2017, MD;  [facility];  Service: Endoscopy;  Laterality: N/A;  2:55   TIF procedure       Current Meds  Medication Sig   amLODipine (NORVASC) 10 MG tablet TAKE 1 TABLET(10 MG) BY MOUTH DAILY   azelastine (ASTELIN) 0.1 % nasal spray Place 1 spray into both nostrils 2 (two) times daily.    Cholecalciferol (VITAMIN D-3) 125 MCG (5000 UT) TABS Take 10,000 Units by mouth daily at 12 noon.   hydrocortisone (ANUSOL-HC) 2.5 % rectal cream Place 1 application rectally 2 (two) times daily.   loratadine (CLARITIN) 10 MG tablet Take 10 mg by mouth daily.   OVER THE COUNTER MEDICATION AG1 - one daily   pantoprazole (PROTONIX) 40 MG tablet Take pantoprazole 40mg  daily as needed for acid reflux   valACYclovir (VALTREX) 1000 MG tablet Take 1,000 mg by mouth 2 (two) times daily.     Family History  Problem Relation Age of Onset   Alcohol abuse Mother    Cancer Mother        lung   COPD Mother    Heart disease Mother    Early death Mother        age 73 multiple factors   Cancer Sister        breast    Social History   Socioeconomic History   Marital status: Single    Spouse name: Not on file   Number of children: 0   Years of education: 51   Highest education level: Not on file  Occupational History   Occupation: retired  Tobacco Use   Smoking status: Never   Smokeless tobacco: Former    Types: Snuff  Vaping Use   Vaping Use: Never used  Substance and Sexual Activity   Alcohol use: Yes    Alcohol/week: 5.0 standard drinks of alcohol    Types: 5 Cans of beer per week    Comment: couple of beer sometimes daily   Drug use: No   Sexual activity: Not Currently    Birth control/protection: None  Other Topics Concern   Not on file  Social History Narrative   Retired.Single.Lives alone.   Ex-[employer].   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 Denies fatigue, weakness, weight loss.  CV: Denies chest pain, palpitations, syncope, peripheral edema, and claudication. Resp: Denies dyspnea at rest, cough, wheezing, coughing up blood, and pleurisy. GI: see HPI Derm: Denies rash, itching, dry skin Psych: Denies depression, anxiety, memory loss, confusion. No homicidal or suicidal ideation.  Heme: Denies bruising, bleeding, and enlarged lymph nodes.  Observations/Objective: No distress. Unable to perform physical exam due to telephone encounter. No video available.   Assessment and Plan: Tim Rogers. Tim Rogers is a 66 year old male who presents virtually today for follow up of GERD.  Feels GERD is well  managed on protonix 40mg  daily. Has no breakthrough symptoms. Notes occasional unsettled feeling in his stomach only occasionally after taking his PPI though states he has no pain, nausea or vomiting. Appetite is good and weight is stable. No early satiety. He inquired about other alternatives to PPI therapy due to concern for side effects. He has had failed TIF procedure in the past. Patient was re-assured regarding PPI use and safety of when appropriate indications in question. Most recent studies on PPI therapy that association of symptoms is not equivalent to causation and overall association with for example osteoporosis is weak and based on observational studies. When PPI use is indicated, it is safe to proceed with therapy and titrate dosing/use based on symptom response. Given symptoms are well controlled, we can try lower PPI dose to 20mg  daily. If GERD is not well controlled on this, will step back up to 40mg .  Last colonoscopy in July 2014, will plan to repeat again in 2024.   -protonix 20mg  daily -reflux precautions  -place on recall list for CRC  screening via colonoscopy in July 2024  Follow Up Instructions: 1 year    I discussed the assessment and treatment plan with the patient. The patient was provided an opportunity to ask questions and all were answered. The patient agreed with the plan and demonstrated an understanding of the instructions.   The patient was advised to call back or seek an in-person evaluation if the symptoms worsen or if the condition fails to improve as anticipated.  I provided 16 minutes of NON face-to-face time during this Telephone encounter.  Windel Keziah L. 10-17-1977, MSN, APRN, AGNP-C Adult-Gerontology Nurse Practitioner [facility]  I have reviewed the note and agree with the APP's assessment as described in this progress note  , MD Gastroenterology and Hepatology [facility] Gastroenterology

## 2022-01-05 ENCOUNTER — Encounter: Payer: Self-pay | Admitting: Cardiology

## 2022-01-05 ENCOUNTER — Ambulatory Visit (INDEPENDENT_AMBULATORY_CARE_PROVIDER_SITE_OTHER): Payer: Medicare Other | Admitting: Cardiology

## 2022-01-05 ENCOUNTER — Other Ambulatory Visit (HOSPITAL_COMMUNITY)
Admission: RE | Admit: 2022-01-05 | Discharge: 2022-01-05 | Disposition: A | Discharge status: Home / Self Care | Payer: Medicare Other | Source: Ambulatory Visit | Attending: Cardiology | Admitting: Cardiology

## 2022-01-05 VITALS — BP 104/80 | HR 66 | Ht 71.0 in | Wt 182.4 lb

## 2022-01-05 DIAGNOSIS — I7121 Aneurysm of the ascending aorta, without rupture: Secondary | ICD-10-CM

## 2022-01-05 DIAGNOSIS — I2510 Atherosclerotic heart disease of native coronary artery without angina pectoris: Secondary | ICD-10-CM | POA: Diagnosis not present

## 2022-01-05 DIAGNOSIS — I351 Nonrheumatic aortic (valve) insufficiency: Secondary | ICD-10-CM | POA: Insufficient documentation

## 2022-01-05 DIAGNOSIS — I2584 Coronary atherosclerosis due to calcified coronary lesion: Secondary | ICD-10-CM | POA: Diagnosis not present

## 2022-01-05 DIAGNOSIS — I10 Essential (primary) hypertension: Secondary | ICD-10-CM | POA: Insufficient documentation

## 2022-01-05 LAB — BASIC METABOLIC PANEL
Anion gap: 8 (ref 5–15)
BUN: 14 mg/dL (ref 8–23)
CO2: 28 mmol/L (ref 22–32)
Calcium: 9.1 mg/dL (ref 8.9–10.3)
Chloride: 103 mmol/L (ref 98–111)
Creatinine, Ser: 1.15 mg/dL (ref 0.61–1.24)
GFR, Estimated: >60 mL/min (ref >60)
Glucose, Bld: 56 mg/dL — ABNORMAL LOW (ref 70–99)
Potassium: 4 mmol/L (ref 3.5–5.1)
Sodium: 139 mmol/L (ref 135–145)

## 2022-01-05 NOTE — Patient Instructions (Signed)
Medication Instructions:  Your physician recommends that you continue on your current medications as directed. Please refer to the Current Medication list given to you today.   Labwork: BMET today  Testing/Procedures: Schedule chest CT for AAA surveillance   Follow-Up: 1 year  Any Other Special Instructions Will Be Listed Below (If Applicable).  If you need a refill on your cardiac medications before your next appointment, please call your pharmacy.

## 2022-01-07 ENCOUNTER — Ambulatory Visit (INDEPENDENT_AMBULATORY_CARE_PROVIDER_SITE_OTHER): Payer: Commercial Managed Care - PPO | Admitting: Gastroenterology

## 2022-02-03 ENCOUNTER — Ambulatory Visit (HOSPITAL_COMMUNITY)
Admission: RE | Admit: 2022-02-03 | Discharge: 2022-02-03 | Disposition: A | Discharge status: Home / Self Care | Payer: Medicare Other | Source: Ambulatory Visit | Attending: Cardiology | Admitting: Cardiology

## 2022-02-03 DIAGNOSIS — I7121 Aneurysm of the ascending aorta, without rupture: Secondary | ICD-10-CM | POA: Diagnosis present

## 2022-02-03 MED ORDER — IOHEXOL 350 MG/ML SOLN
100.0000 mL | Freq: Once | INTRAVENOUS | Status: AC | PRN
  Administered 2022-02-03: 100 mL via INTRAVENOUS

## 2022-02-04 ENCOUNTER — Telehealth: Payer: Self-pay | Admitting: Cardiology

## 2022-02-04 NOTE — Telephone Encounter (Signed)
Pt is returning call in regards to results. Transferred to Catherine, RN.  

## 2022-02-04 NOTE — Telephone Encounter (Signed)
Results discussed with patient.placed in recall

## 2022-03-02 ENCOUNTER — Other Ambulatory Visit: Payer: Medicare Other

## 2022-03-03 LAB — TESTOSTERONE: Testosterone: 572 ng/dL (ref 264–916)

## 2022-03-03 LAB — HEMOGLOBIN AND HEMATOCRIT, BLOOD
Hematocrit: 49 % (ref 37.5–51.0)
Hemoglobin: 16.8 g/dL (ref 13.0–17.7)

## 2022-03-03 LAB — PSA: Prostate Specific Ag, Serum: 0.8 ng/mL (ref 0.0–4.0)

## 2022-03-04 ENCOUNTER — Telehealth: Payer: Self-pay

## 2022-03-04 NOTE — Telephone Encounter (Signed)
Made patient aware that he can have his testopel insertion done in a few weeks. Scheduled pt for testopel insertion and patient voiced understanding.

## 2022-03-04 NOTE — Telephone Encounter (Signed)
Made patient aware that his labs are all normal.  F/u as planned. Patient voiced that he will like to know when he need to get his next testopel insertion. Made patient aware that I will send a task to DR. Wrenn and once he response someone will be in touch. Patient voiced understanding.

## 2022-03-04 NOTE — Telephone Encounter (Signed)
-----   Message from Irine Seal, MD sent at 03/03/2022  7:28 PM EST ----- His labs are all normal.  F/u as planned.   ----- Message ----- From: Sherrilyn Rist, CMA Sent: 03/03/2022   8:21 AM EST To: Irine Seal, MD  Please review

## 2022-03-18 ENCOUNTER — Ambulatory Visit (INDEPENDENT_AMBULATORY_CARE_PROVIDER_SITE_OTHER): Payer: Medicare Other | Admitting: Urology

## 2022-03-18 ENCOUNTER — Encounter: Payer: Self-pay | Admitting: Urology

## 2022-03-18 VITALS — BP 123/82 | HR 80

## 2022-03-18 DIAGNOSIS — D751 Secondary polycythemia: Secondary | ICD-10-CM | POA: Diagnosis not present

## 2022-03-18 DIAGNOSIS — E291 Testicular hypofunction: Secondary | ICD-10-CM | POA: Diagnosis not present

## 2022-03-18 MED ORDER — TESTOSTERONE 75 MG IL PLLT
450.0000 mg | PELLET | Freq: Once | Status: AC
  Administered 2022-03-18 – 2022-03-24 (×2): 450 mg

## 2022-03-18 NOTE — Progress Notes (Signed)
Subjective: 1. Hypogonadism in male   2. Secondary polycythemia     03/18/22: Rosanne Ashing returns today for testopel replacement.  His T is 572 with a Hgb of 16.8 which is down some.  He can tell the pellets are depleting since his anxiety is recurring. He has no other complatins.  His PSA remains low at 0.8.   12/03/21: Rosanne Ashing returns today in f/u for his history noted below.  His T is 812 and his Hgb is 17.3.  He hasn't had to donate blood in 9 months.   His libido remains low but his energy is good.  He has good erectile function.  He is voiding well with no nocturia.    08/27/21: Rosanne Ashing returns today for testopel insertion for his history noted below.  His last labs were in 06/18/21 and his T was 765 and Hgb was 17.1.   05/14/21: Rosanne Ashing returns today in f/u for his history of hypogonadism and secondary polycythemia.   He had Testopel on 12/22 and his T is 628 with a Hgb of 17.7 and Hct 51.6%.  He is doing well on treatment but didn't get the "kick" that he got with the injections.  His PSA is 1.1.    02/19/21: Kirt returns today in f/u for a testopel implant.  His Hgb is down to normal after two phlebotomies.    11/27/20: Deondrick returns today in f/u.  He reports he is doing well and the discomfort with ejaculation has improved and is minimal.  A renal stone study shows some hepatic cysts and prostate stones but no other significant findings.  His IPSS is 0 and his UA is clear.  He remains on TRT with topicals but was interested in testopel.  He does report a prior history of elevated Hemoglobins with TRT.    Gu Hx: Helton is a 67 yo male who is sent for evaluation of perineal pain.  He got an antibiotic for 5 days and his symptoms recurred.  He got another 7 days but the symptoms recurred.  He has now been on the bactrim for 7 weeks.  He has no further pain with ejaculation but he still can have some suprapubic discomfort.  He has increased symptoms with tea or diet coke.  Beer will also impact it.  He is voiding well  with an IPSS of 1.  He has minimal change in his bowel habits.   He has had no documented stones or GU surgery.  He thinks he may have passed a stone 4 weeks ago which he had a sharp burning at the tip of the penis x 1. He had a CT AP in 2006 and there were no stones.  He had UA's on 5/24 and 7/13 that had 0-2 RBC's on one.  He has 0-2 RBC's today.   HIs PSA was 1.1 on 05/14/20.  He is on TRT currently with a topical but he had been getting [facility].  He takes a lot of Vitamins.  His most recent T is about 1000.   ROS:  Review of Systems  Psychiatric/Behavioral:  The patient is nervous/anxious.   All other systems reviewed and are negative.   Allergies  Allergen Reactions   Lidocaine-Epinephrine Shortness Of Breath, Anxiety and Palpitations    Past Medical History:  Diagnosis Date   Acid reflux    Anxiety    2003   Ascending aortic aneurysm (HCC)    GERD (gastroesophageal reflux disease)    Low testosterone  PVCs (premature ventricular contractions)     Past Surgical History:  Procedure Laterality Date   CHOLECYSTECTOMY     ESOPHAGOGASTRODUODENOSCOPY N/A 09/21/2017   Procedure: ESOPHAGOGASTRODUODENOSCOPY (EGD);  Surgeon: Rogene Houston, MD;  [facility];  Service: Endoscopy;  Laterality: N/A;  2:55   TIF procedure      Social History   Socioeconomic History   Marital status: Single    Spouse name: Not on file   Number of children: 0   Years of education: 48   Highest education level: Not on file  Occupational History   Occupation: retired  Tobacco Use   Smoking status: Never   Smokeless tobacco: Former    Types: Snuff  Vaping Use   Vaping Use: Never used  Substance and Sexual Activity   Alcohol use: Yes    Alcohol/week: 5.0 standard drinks of alcohol    Types: 5 Cans of beer per week    Comment: couple of beer sometimes daily   Drug use: No   Sexual activity: Not Currently    Birth control/protection: None  Other Topics  Concern   Not on file  Social History Narrative   Retired.Single.Lives alone.   Ex-[employer].   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   Social Determinants of Radio broadcast assistant Strain: Not on [employer] Insecurity: Not on file  Transportation Needs: No Transportation Needs (10/19/2021)   PRAPARE - Hydrologist (Medical): No    Lack of Transportation (Non-Medical): No  Physical Activity: Sufficiently Active (10/19/2021)   Exercise Vital Sign    Days of Exercise per Week: 4 days    Minutes of Exercise per Session: 90 min  Stress: Not on file  Social Connections: Not on file  Intimate Partner Violence: Not on file    Family History  Problem Relation Age of Onset   Alcohol abuse Mother    Cancer Mother        lung   COPD Mother    Heart disease Mother    Early death Mother        age 26 multiple factors   Cancer Sister        breast    Anti-infectives: Anti-infectives (From admission, onward)    None       Current Outpatient Medications  Medication Sig Dispense Refill   amLODipine (NORVASC) 10 MG tablet TAKE 1 TABLET(10 MG) BY MOUTH DAILY 90 tablet 0   azelastine (ASTELIN) 0.1 % nasal spray Place 1 spray into both nostrils 2 (two) times daily. 30 mL 3   Cholecalciferol (VITAMIN D-3) 125 MCG (5000 UT) TABS Take 10,000 Units by mouth daily at 12 noon.     hydrocortisone (ANUSOL-HC) 2.5 % rectal cream Place 1 application rectally 2 (two) times daily. 28 g 2   loratadine (CLARITIN) 10 MG tablet Take 10 mg by mouth daily.     OVER THE COUNTER MEDICATION AG1 - one daily     pantoprazole (PROTONIX) 20 MG tablet Take 1 tablet (20 mg total) by mouth daily. 90 tablet 3   valACYclovir (VALTREX) 1000 MG tablet Take 1,000 mg by mouth 2 (two) times daily.     Current Facility-Administered Medications  Medication Dose Route Frequency Provider Last Rate Last Admin    Testosterone PLLT 450 mg  450 mg Implant Once Irine Seal, MD  Objective: Vital signs in last 24 hours: BP 123/82   Pulse 80   Intake/Output from previous day: No intake/output data recorded. Intake/Output this shift: @IOTHISSHIFT @   Physical Exam Vitals reviewed.  Constitutional:      Appearance: Normal appearance.  Neurological:     Mental Status: He is alert.     Lab Results:  Recent Results (from the past 2160 hour(s))  Basic metabolic panel     Status: Abnormal   Collection Time: 01/05/22  9:24 AM  Result Value Ref Range   Sodium 139 135 - 145 mmol/L   Potassium 4.0 3.5 - 5.1 mmol/L   Chloride 103 98 - 111 mmol/L   CO2 28 22 - 32 mmol/L   Glucose, Bld 56 (L) 70 - 99 mg/dL    Comment: Glucose reference range applies only to samples taken after fasting for at least 8 hours.   BUN 14 8 - 23 mg/dL   Creatinine, Ser 1.15 0.61 - 1.24 mg/dL   Calcium 9.1 8.9 - 10.3 mg/dL   GFR, Estimated >60 >60 mL/min    Comment: (NOTE) Calculated using the CKD-EPI Creatinine Equation (2021)    Anion gap 8 5 - 15    Comment: Performed at [facility], [address] [place], [place] [address]  PSA     Status: None   Collection Time: 03/02/22  9:09 AM  Result Value Ref Range   Prostate Specific Ag, Serum 0.8 0.0 - 4.0 ng/mL    Comment: Roche ECLIA methodology. According to the American Urological Association, Serum PSA should decrease and remain at undetectable levels after radical prostatectomy. The AUA defines biochemical recurrence as an initial PSA value 0.2 ng/mL or greater followed by a subsequent confirmatory PSA value 0.2 ng/mL or greater. Values obtained with different assay methods or kits cannot be used interchangeably. Results cannot be interpreted as absolute evidence of the presence or absence of malignant disease.   Hemoglobin and hematocrit, blood     Status: None   Collection Time: 03/02/22  9:09 AM  Result Value Ref Range   Hemoglobin 16.8 13.0 -  17.7 g/dL   Hematocrit 49.0 37.5 - 51.0 %  Testosterone     Status: None   Collection Time: 03/02/22  9:09 AM  Result Value Ref Range   Testosterone 572 264 - 916 ng/dL    Comment: Adult male reference interval is based on a population of healthy nonobese males (BMI <30) between 76 and 40 years old. [place], [place] [phone]. PMID: [id number].    .brief  BMET No results for input(s): "NA", "K", "CL", "CO2", "GLUCOSE", "BUN", "CREATININE", "CALCIUM" in the last 72 hours. PT/INR No results for input(s): "LABPROT", "INR" in the last 72 hours. ABG No results for input(s): "PHART", "HCO3" in the last 72 hours.  Invalid input(s): "PCO2", "PO2"   Studies/Results: CT ANGIO CHEST AORTA W &/OR WO CONTRAST  Result Date: 02/03/2022 CLINICAL DATA:  Ascending thoracic aortic aneurysm. EXAM: CT ANGIOGRAPHY CHEST WITH CONTRAST TECHNIQUE: Multidetector CT imaging of the chest was performed using the standard protocol during bolus administration of intravenous contrast. Multiplanar CT image reconstructions and MIPs were obtained to evaluate the vascular anatomy. RADIATION DOSE REDUCTION: This exam was performed according to the departmental dose-optimization program which includes automated exposure control, adjustment of the mA and/or kV according to patient size and/or use of iterative reconstruction technique. CONTRAST:  19mL OMNIPAQUE IOHEXOL 350 MG/ML SOLN COMPARISON:  January 08, 2021. FINDINGS: Cardiovascular: Grossly stable 4.2 cm ascending thoracic aortic aneurysm  is noted. Great vessel widely patent. No dissection is noted. Normal cardiac size. No pericardial effusion. Mediastinum/Nodes: No enlarged mediastinal, hilar, or axillary lymph nodes. Thyroid gland, trachea, and esophagus demonstrate no significant findings. Lungs/Pleura: Lungs are clear. No pleural effusion or pneumothorax. Upper Abdomen: No acute abnormality. Musculoskeletal: No chest wall abnormality. No acute or  significant osseous findings. Review of the MIP images confirms the above findings. IMPRESSION: Grossly stable 4.2 cm ascending thoracic aortic aneurysm. Recommend annual imaging followup by CTA or MRA. This recommendation follows 2010 ACCF/AHA/AATS/ACR/ASA/SCA/SCAI/SIR/STS/SVM Guidelines for the Diagnosis and Management of Patients with Thoracic Aortic Disease. Circulation. 2010; 121: G836-O294. Aortic aneurysm NOS (ICD10-I71.9). Electronically Signed   By: Marijo Conception M.D.   On: 02/03/2022 12:33     Procedure:  Testopel implant.  The right upper buttock was prepped with betadine and draped with sterile towels.  The insertion site was chosen and then infiltrated with 37ml of 2% lidocaine without epinepherine.  A small stab wound was made with the 15 blade and the insertion trocar was the passed into the subcutaneous fat.  6 Testopel pellets were then deployed one at a time.   The trocar was removed and then the wound was cleaned with alcohol which was then was closed with overlapping 1/2 inch steristrips.  A dressing of 4x4's and hypofix was applied and he was then rolled supine where he lay for 5 minutes to apply pressure to the wound.   There was mild oozing but  no other complications.   Assessment/Plan:  Hypogonadism.  He is doing well on testopel.  H&H and T today and in 3 months.    History of acquired polycythemia.   H&H is 16.8 .   Meds ordered this encounter  Medications   Testosterone PLLT 450 mg     Orders Placed This Encounter  Procedures   Urinalysis, Routine w reflex microscopic   Hemoglobin and hematocrit, blood    Standing Status:   Future    Standing Expiration Date:   09/16/2022     Return in about 3 months (around 06/17/2022) for with labs for testopel. Irine Seal 03/18/2022 [phone] Patient ID: [name], male   DOB: [birth year], 67 y.o.   MRN: [id number]

## 2022-03-24 DIAGNOSIS — E291 Testicular hypofunction: Secondary | ICD-10-CM | POA: Diagnosis not present

## 2022-04-16 ENCOUNTER — Other Ambulatory Visit: Payer: Self-pay | Admitting: Cardiology

## 2022-06-17 ENCOUNTER — Other Ambulatory Visit: Payer: Medicare Other

## 2022-06-17 ENCOUNTER — Other Ambulatory Visit: Payer: Self-pay

## 2022-06-17 DIAGNOSIS — E291 Testicular hypofunction: Secondary | ICD-10-CM

## 2022-06-18 LAB — HEMOGLOBIN AND HEMATOCRIT, BLOOD
Hematocrit: 51.6 % — ABNORMAL HIGH (ref 37.5–51.0)
Hemoglobin: 17 g/dL (ref 13.0–17.7)

## 2022-06-18 LAB — TESTOSTERONE: Testosterone: 861 ng/dL (ref 264–916)

## 2022-08-03 ENCOUNTER — Encounter (INDEPENDENT_AMBULATORY_CARE_PROVIDER_SITE_OTHER): Payer: Self-pay | Admitting: *Deleted

## 2022-08-09 ENCOUNTER — Telehealth: Payer: Self-pay | Admitting: Cardiology

## 2022-08-09 DIAGNOSIS — I77819 Aortic ectasia, unspecified site: Secondary | ICD-10-CM

## 2022-08-09 DIAGNOSIS — I7121 Aneurysm of the ascending aorta, without rupture: Secondary | ICD-10-CM

## 2022-08-09 NOTE — Addendum Note (Signed)
Addended by: Eustace Moore on: 08/09/2022 09:17 AM   Modules accepted: Orders

## 2022-08-09 NOTE — Telephone Encounter (Addendum)
Patient informed and verbalized understanding of plan.  Patient aware to pick up CD film of thoracic and lumbar spine

## 2022-08-09 NOTE — Telephone Encounter (Signed)
Checking percert on the following patient for testing scheduled at [facility].   [place] AAA DUPLEX COMPLETE-    08/24/2022

## 2022-08-09 NOTE — Telephone Encounter (Signed)
I received communication from [employer], [place] regarding recent plain film of the thoracic and lumbar spine which incidentally showed some enlargement of the aorta.  Mr. Scholze has a known thoracic aortic aneurysm which has been imaged within the last 6 months.  I would recommend an abdominal ultrasound to exclude AAA as this has not been specifically imaged recently.

## 2022-08-23 ENCOUNTER — Ambulatory Visit (HOSPITAL_COMMUNITY): Payer: PRIVATE HEALTH INSURANCE

## 2022-08-24 ENCOUNTER — Telehealth (INDEPENDENT_AMBULATORY_CARE_PROVIDER_SITE_OTHER): Payer: Self-pay | Admitting: Gastroenterology

## 2022-08-24 ENCOUNTER — Ambulatory Visit (HOSPITAL_COMMUNITY)
Admission: RE | Admit: 2022-08-24 | Discharge: 2022-08-24 | Disposition: A | Discharge status: Home / Self Care | Payer: Medicare Other | Source: Ambulatory Visit | Attending: Cardiology | Admitting: Cardiology

## 2022-08-24 DIAGNOSIS — I7121 Aneurysm of the ascending aorta, without rupture: Secondary | ICD-10-CM | POA: Diagnosis not present

## 2022-08-24 DIAGNOSIS — I77819 Aortic ectasia, unspecified site: Secondary | ICD-10-CM | POA: Diagnosis present

## 2022-08-24 DIAGNOSIS — I10 Essential (primary) hypertension: Secondary | ICD-10-CM | POA: Insufficient documentation

## 2022-08-24 DIAGNOSIS — I712 Thoracic aortic aneurysm, without rupture, unspecified: Secondary | ICD-10-CM | POA: Insufficient documentation

## 2022-08-24 NOTE — Telephone Encounter (Signed)
Who is your primary care physician: Dr.Dixon  Reasons for the colonoscopy: Recall  Have you had a colonoscopy before?  Yes 2014  Do you have family history of colon cancer? no  Previous colonoscopy with polyps removed? Not sure  Do you have a history colorectal cancer?   no  Are you diabetic? If yes, Type 1 or Type 2?    no  Do you have a prosthetic or mechanical heart valve? no  Do you have a pacemaker/defibrillator?   no  Have you had endocarditis/atrial fibrillation? no  Have you had joint replacement within the last 12 months?  no  Do you tend to be constipated or have to use laxatives? no  Do you have any history of drugs or alchohol?  Yes alcohol   Do you use supplemental oxygen?  no  Have you had a stroke or heart attack within the last 6 months? no  Do you take weight loss medication?  no      Do you take any blood-thinning medications such as: (aspirin, warfarin, Plavix, Aggrenox)  no  If yes we need the name, milligram, dosage and who is prescribing doctor  Current Outpatient Medications on File Prior to Visit  Medication Sig Dispense Refill   amLODipine (NORVASC) 10 MG tablet TAKE 1 TABLET(10 MG) BY MOUTH DAILY 90 tablet 1   pantoprazole (PROTONIX) 20 MG tablet Take 1 tablet (20 mg total) by mouth daily. 90 tablet 3   azelastine (ASTELIN) 0.1 % nasal spray Place 1 spray into both nostrils 2 (two) times daily. (Patient not taking: Reported on 08/24/2022) 30 mL 3   Cholecalciferol (VITAMIN D-3) 125 MCG (5000 UT) TABS Take 10,000 Units by mouth daily at 12 noon. (Patient not taking: Reported on 08/24/2022)     hydrocortisone (ANUSOL-HC) 2.5 % rectal cream Place 1 application rectally 2 (two) times daily. (Patient not taking: Reported on 08/24/2022) 28 g 2   loratadine (CLARITIN) 10 MG tablet Take 10 mg by mouth daily. (Patient not taking: Reported on 08/24/2022)     OVER THE COUNTER MEDICATION AG1 - one daily (Patient not taking: Reported on 08/24/2022)     valACYclovir  (VALTREX) 1000 MG tablet Take 1,000 mg by mouth 2 (two) times daily. (Patient not taking: Reported on 08/24/2022)     No current facility-administered medications on file prior to visit.    Allergies  Allergen Reactions   Lidocaine-Epinephrine Shortness Of Breath, Anxiety and Palpitations     Pharmacy: [facility] [address] [facility]   Primary Insurance Name: Medicare A &B; [employer]  Best number where you can be reached: [phone]

## 2022-08-24 NOTE — Telephone Encounter (Signed)
Room 1 Thanks 

## 2022-08-25 ENCOUNTER — Telehealth: Payer: Self-pay

## 2022-08-25 NOTE — Telephone Encounter (Signed)
Patient notified and verbalized understanding of results. Patient had no questions or concerns at this time.  

## 2022-08-25 NOTE — Telephone Encounter (Signed)
-----   Message from Jonelle Sidle, MD sent at 08/24/2022  9:03 PM EDT ----- Abdominal aorta is mildly ectatic at 3.1 cm, but not aneurysmal. Keep follow-up as scheduled.

## 2022-08-26 ENCOUNTER — Encounter (INDEPENDENT_AMBULATORY_CARE_PROVIDER_SITE_OTHER): Payer: Self-pay

## 2022-08-26 NOTE — Telephone Encounter (Signed)
Left message to return call 

## 2022-08-26 NOTE — Telephone Encounter (Signed)
Mychart message sent to patient with recommendations.

## 2022-08-26 NOTE — Telephone Encounter (Signed)
Pt left voicemail returning call.Returned call to patient.  Does pt need EGD also due to acid reflux and need to check for Barretts? Last EGD in 2019.  Pt would like to be called when first available morning appt comes available on a Wednesday or Friday.   Pt states the 20 mg Protonix is "not cutting it". Pt requesting to go back on 40 mg. Please advise. Thank you.

## 2022-08-26 NOTE — Telephone Encounter (Signed)
Agree with this, thanks [facility]

## 2022-09-09 ENCOUNTER — Ambulatory Visit: Payer: Medicare Other | Admitting: Urology

## 2022-09-23 NOTE — Telephone Encounter (Signed)
Pt contacted and scheduled for 10/01/22 at 8:15 am. Pt requesting Clenpiq. Clenpiq and instructions up front for pick up.

## 2022-10-01 ENCOUNTER — Other Ambulatory Visit: Payer: Self-pay

## 2022-10-01 ENCOUNTER — Encounter (HOSPITAL_COMMUNITY): Payer: Self-pay | Admitting: Gastroenterology

## 2022-10-01 ENCOUNTER — Ambulatory Visit (HOSPITAL_COMMUNITY): Payer: Medicare Other | Admitting: Certified Registered"

## 2022-10-01 ENCOUNTER — Ambulatory Visit (HOSPITAL_COMMUNITY)
Admission: RE | Admit: 2022-10-01 | Discharge: 2022-10-01 | Disposition: A | Discharge status: Home / Self Care | Payer: Medicare Other | Source: Ambulatory Visit | Attending: Gastroenterology | Admitting: Gastroenterology

## 2022-10-01 ENCOUNTER — Ambulatory Visit (HOSPITAL_BASED_OUTPATIENT_CLINIC_OR_DEPARTMENT_OTHER): Payer: Medicare Other | Admitting: Certified Registered"

## 2022-10-01 ENCOUNTER — Encounter (HOSPITAL_COMMUNITY)
Admission: RE | Disposition: A | Discharge status: Home / Self Care | Payer: Self-pay | Source: Ambulatory Visit | Attending: Gastroenterology

## 2022-10-01 DIAGNOSIS — K219 Gastro-esophageal reflux disease without esophagitis: Secondary | ICD-10-CM | POA: Insufficient documentation

## 2022-10-01 DIAGNOSIS — K621 Rectal polyp: Secondary | ICD-10-CM | POA: Insufficient documentation

## 2022-10-01 DIAGNOSIS — Z1211 Encounter for screening for malignant neoplasm of colon: Secondary | ICD-10-CM

## 2022-10-01 DIAGNOSIS — D120 Benign neoplasm of cecum: Secondary | ICD-10-CM | POA: Diagnosis not present

## 2022-10-01 DIAGNOSIS — K648 Other hemorrhoids: Secondary | ICD-10-CM | POA: Diagnosis not present

## 2022-10-01 DIAGNOSIS — K635 Polyp of colon: Secondary | ICD-10-CM

## 2022-10-01 DIAGNOSIS — D123 Benign neoplasm of transverse colon: Secondary | ICD-10-CM | POA: Diagnosis not present

## 2022-10-01 DIAGNOSIS — D122 Benign neoplasm of ascending colon: Secondary | ICD-10-CM | POA: Diagnosis not present

## 2022-10-01 DIAGNOSIS — I2510 Atherosclerotic heart disease of native coronary artery without angina pectoris: Secondary | ICD-10-CM | POA: Insufficient documentation

## 2022-10-01 DIAGNOSIS — D126 Benign neoplasm of colon, unspecified: Secondary | ICD-10-CM | POA: Diagnosis not present

## 2022-10-01 HISTORY — PX: COLONOSCOPY WITH PROPOFOL: SHX5780

## 2022-10-01 HISTORY — PX: POLYPECTOMY: SHX149

## 2022-10-01 LAB — HM COLONOSCOPY

## 2022-10-01 SURGERY — COLONOSCOPY WITH PROPOFOL
Anesthesia: General

## 2022-10-01 MED ORDER — PROPOFOL 10 MG/ML IV BOLUS
INTRAVENOUS | Status: DC | PRN
  Administered 2022-10-01: 10 mg via INTRAVENOUS
  Administered 2022-10-01 (×2): 20 mg via INTRAVENOUS
  Administered 2022-10-01 (×2): 10 mg via INTRAVENOUS
  Administered 2022-10-01: 20 mg via INTRAVENOUS
  Administered 2022-10-01: 100 mg via INTRAVENOUS
  Administered 2022-10-01 (×2): 10 mg via INTRAVENOUS
  Administered 2022-10-01: 20 mg via INTRAVENOUS
  Administered 2022-10-01: 10 mg via INTRAVENOUS
  Administered 2022-10-01: 20 mg via INTRAVENOUS

## 2022-10-01 MED ORDER — LACTATED RINGERS IV SOLN
INTRAVENOUS | Status: DC
  Administered 2022-10-01: 1000 mL via INTRAVENOUS

## 2022-10-01 MED ORDER — EPHEDRINE SULFATE (PRESSORS) 50 MG/ML IJ SOLN
INTRAMUSCULAR | Status: DC | PRN
  Administered 2022-10-01 (×2): 5 mg via INTRAVENOUS

## 2022-10-01 NOTE — H&P (Signed)
Tim Rogers is an 67 y.o. male.   Chief Complaint: Colorectal cancer screening HPI: 67 year old male with past medical history of anxiety, GERD, PVCs, coming for colorectal cancer screening.  Last colonoscopy performed in 2014 which was within normal limits.  The patient denies having any complaints such as melena, hematochezia, abdominal pain or distention, change in her bowel movement consistency or frequency, no changes in weight recently.  No family history of colorectal cancer.   Past Medical History:  Diagnosis Date   Acid reflux    Anxiety    2003   Ascending aortic aneurysm (HCC)    GERD (gastroesophageal reflux disease)    Low testosterone    PVCs (premature ventricular contractions)     Past Surgical History:  Procedure Laterality Date   CHOLECYSTECTOMY     ESOPHAGOGASTRODUODENOSCOPY N/A 09/21/2017   Procedure: ESOPHAGOGASTRODUODENOSCOPY (EGD);  Surgeon: Malissa Hippo, MD;  [facility]: [facility];  Service: Endoscopy;  Laterality: N/A;  2:55   TIF procedure      Family History  Problem Relation Age of Onset   Alcohol abuse Mother    Cancer Mother        lung   COPD Mother    Heart disease Mother    Early death Mother        age 51 multiple factors   Cancer Sister        breast   Social History:  reports that he has never smoked. He has quit using smokeless tobacco.  His smokeless tobacco use included snuff. He reports current alcohol use of about 5.0 standard drinks of alcohol per week. He reports that he does not use drugs.  Allergies:  Allergies  Allergen Reactions   Lidocaine-Epinephrine Shortness Of Breath, Anxiety and Palpitations    Medications Prior to Admission  Medication Sig Dispense Refill   amLODipine (NORVASC) 10 MG tablet TAKE 1 TABLET(10 MG) BY MOUTH DAILY 90 tablet 1   azelastine (ASTELIN) 0.1 % nasal spray Place 1 spray into both nostrils 2 (two) times daily. 30 mL 3   Cholecalciferol (VITAMIN D-3) 125 MCG (5000 UT) TABS Take 10,000  Units by mouth daily at 12 noon.     hydrocortisone (ANUSOL-HC) 2.5 % rectal cream Place 1 application rectally 2 (two) times daily. 28 g 2   loratadine (CLARITIN) 10 MG tablet Take 10 mg by mouth daily.     OVER THE COUNTER MEDICATION AG1 - one daily     pantoprazole (PROTONIX) 20 MG tablet Take 1 tablet (20 mg total) by mouth daily. 90 tablet 3   valACYclovir (VALTREX) 1000 MG tablet Take 1,000 mg by mouth 2 (two) times daily. (Patient not taking: Reported on 08/24/2022)      No results found for this or any previous visit (from the past 48 hour(s)). No results found.  Review of Systems  All other systems reviewed and are negative.   Blood pressure (!) 144/90, pulse 63, temperature 98.4 F (36.9 C), temperature source Oral, resp. rate 16, height 5\' 11"  (1.803 m), weight 81.6 kg, SpO2 98%. Physical Exam  GENERAL: The patient is AO x3, in no acute distress. HEENT: Head is normocephalic and atraumatic. EOMI are intact. Mouth is well hydrated and without lesions. NECK: Supple. No masses LUNGS: Clear to auscultation. No presence of rhonchi/wheezing/rales. Adequate chest expansion HEART: RRR, normal s1 and s2. ABDOMEN: Soft, nontender, no guarding, no peritoneal signs, and nondistended. BS +. No masses. EXTREMITIES: Without any cyanosis, clubbing, rash, lesions or edema. NEUROLOGIC: AOx3,  no focal motor deficit. SKIN: no jaundice, no rashes  Assessment/Plan 67 year old male with past medical history of anxiety, GERD, PVCs, coming for colorectal cancer screening.  The patient is at average risk for colorectal cancer.  We will proceed with colonoscopy today.   Dolores Frame, MD 10/01/2022, 7:30 AM

## 2022-10-01 NOTE — Transfer of Care (Signed)
Immediate Anesthesia Transfer of Care Note  Patient: Tim Rogers  Procedure(s) Performed: COLONOSCOPY WITH PROPOFOL POLYPECTOMY INTESTINAL  Patient Location: PACU  Anesthesia Type:General  Level of Consciousness: awake, alert , oriented, and patient cooperative  Airway & Oxygen Therapy: Patient Spontanous Breathing and Patient connected to nasal cannula oxygen  Post-op Assessment: Report given to RN, Post -op Vital signs reviewed and stable, and Patient moving all extremities X 4  Post vital signs: Reviewed and stable  Last Vitals:  Vitals Value Taken Time  BP    Temp    Pulse    Resp    SpO2     VSS and to be entered by PACU RN.   Last Pain:  Vitals:   10/01/22 0753  TempSrc:   PainSc: 0-No pain      Patients Stated Pain Goal: 8 (10/01/22 0714)  Complications: No notable events documented.

## 2022-10-01 NOTE — Op Note (Signed)
[facility] Patient Name: Tim Rogers Procedure Date: 10/01/2022 7:50 AM MRN: [id number] Date of Birth: 1956/01/20 Attending MD: Katrinka Blazing , , [id number] CSN: [id number] Age: 67 Admit Type: Outpatient Procedure:                Colonoscopy Indications:              Screening for colorectal malignant neoplasm Providers:                Katrinka Blazing, Crystal Page, Zena Amos Referring MD:              Medicines:                Monitored Anesthesia Care Complications:            No immediate complications. Estimated Blood Loss:     Estimated blood loss: none. Procedure:                Pre-Anesthesia Assessment:                           - Prior to the procedure, a History and Physical                            was performed, and patient medications, allergies                            and sensitivities were reviewed. The patient's                            tolerance of previous anesthesia was reviewed.                           - The risks and benefits of the procedure and the                            sedation options and risks were discussed with the                            patient. All questions were answered and informed                            consent was obtained.                           - ASA Grade Assessment: II - A patient with mild                            systemic disease.                           After obtaining informed consent, the colonoscope                            was passed under direct vision. Throughout the                            procedure, the patient's blood pressure,  pulse, and                            oxygen saturations were monitored continuously. The                            [id number]-HQ190L ([id number]) scope was introduced through                            the anus and advanced to the the cecum, identified                            by appendiceal orifice and ileocecal valve. The                            colonoscopy  was performed without difficulty. The                            patient tolerated the procedure well. The quality                            of the bowel preparation was adequate to identify                            polyps greater than 5 mm in size. Scope In: 7:58:19 AM Scope Out: 8:29:33 AM Scope Withdrawal Time: 0 hours 19 minutes 17 seconds  Total Procedure Duration: 0 hours 31 minutes 14 seconds  Findings:      The perianal and digital rectal examinations were normal.      Three sessile polyps were found in the ascending colon and cecum. The       polyps were 2 to 6 mm in size. These polyps were removed with a cold       snare. Resection and retrieval were complete.      A 1 mm polyp was found in the ascending colon. The polyp was sessile.       The polyp was removed with a jumbo cold forceps. Resection and retrieval       were complete.      A 4 mm polyp was found in the transverse colon. The polyp was sessile.       The polyp was removed with a cold snare. Resection and retrieval were       complete.      Three sessile polyps were found in the rectum and descending colon. The       polyps were 3 to 5 mm in size. These polyps were removed with a cold       snare. Resection and retrieval were complete.      Non-bleeding internal hemorrhoids were found during retroflexion. The       hemorrhoids were medium-sized. Impression:               - Three 2 to 6 mm polyps in the ascending colon and                            in the cecum, removed with a cold snare. Resected  and retrieved.                           - One 1 mm polyp in the ascending colon, removed                            with a jumbo cold forceps. Resected and retrieved.                           - One 4 mm polyp in the transverse colon, removed                            with a cold snare. Resected and retrieved.                           - Three 3 to 5 mm polyps in the rectum and in the                             descending colon, removed with a cold snare.                            Resected and retrieved.                           - Non-bleeding internal hemorrhoids. Moderate Sedation:      Per Anesthesia Care Recommendation:           - Discharge patient to home (ambulatory).                           - Resume previous diet.                           - Await pathology results.                           - Repeat colonoscopy in 3 years for surveillance. Procedure Code(s):        --- Professional ---                           [phone], Colonoscopy, flexible; with removal of                            tumor(s), polyp(s), or other lesion(s) by snare                            technique                           45380, 59, Colonoscopy, flexible; with biopsy,                            single or multiple Diagnosis Code(s):        --- Professional ---  Z12.11, Encounter for screening for malignant                            neoplasm of colon                           D12.2, Benign neoplasm of ascending colon                           D12.0, Benign neoplasm of cecum                           D12.8, Benign neoplasm of rectum                           D12.4, Benign neoplasm of descending colon                           D12.3, Benign neoplasm of transverse colon (hepatic                            flexure or splenic flexure)                           K64.8, Other hemorrhoids CPT copyright 2022 [employer]. All rights reserved. The codes documented in this report are preliminary and upon coder review may  be revised to meet current compliance requirements. Katrinka Blazing, MD Katrinka Blazing,  10/01/2022 8:36:32 AM This report has been signed electronically. Number of Addenda: 0

## 2022-10-01 NOTE — Discharge Instructions (Addendum)
You are being discharged to home.  Resume your previous diet.  We are waiting for your pathology results.  Your physician has recommended a repeat colonoscopy in three years for surveillance.  

## 2022-10-01 NOTE — Anesthesia Preprocedure Evaluation (Signed)
Anesthesia Evaluation  Patient identified by MRN, date of birth, ID band Patient awake    Reviewed: Allergy & Precautions, H&P , NPO status , Patient's Chart, lab work & pertinent test results, reviewed documented beta blocker date and time   Airway Mallampati: II  TM Distance: >3 FB Neck ROM: full    Dental no notable dental hx.    Pulmonary neg pulmonary ROS   Pulmonary exam normal breath sounds clear to auscultation       Cardiovascular Exercise Tolerance: Good + CAD  negative cardio ROS  Rhythm:regular Rate:Normal     Neuro/Psych   Anxiety     negative neurological ROS  negative psych ROS   GI/Hepatic negative GI ROS, Neg liver ROS,GERD  ,,  Endo/Other  negative endocrine ROS    Renal/GU negative Renal ROS  negative genitourinary   Musculoskeletal   Abdominal   Peds  Hematology negative hematology ROS (+)   Anesthesia Other Findings   Reproductive/Obstetrics negative OB ROS                             Anesthesia Physical Anesthesia Plan  ASA: 2  Anesthesia Plan: General   Post-op Pain Management:    Induction:   PONV Risk Score and Plan: Propofol infusion  Airway Management Planned:   Additional Equipment:   Intra-op Plan:   Post-operative Plan:   Informed Consent: I have reviewed the patients History and Physical, chart, labs and discussed the procedure including the risks, benefits and alternatives for the proposed anesthesia with the patient or authorized representative who has indicated his/her understanding and acceptance.     Dental Advisory Given  Plan Discussed with: CRNA  Anesthesia Plan Comments:        Anesthesia Quick Evaluation

## 2022-10-01 NOTE — Anesthesia Postprocedure Evaluation (Signed)
Anesthesia Post Note  Patient: Tim Rogers  Procedure(s) Performed: COLONOSCOPY WITH PROPOFOL POLYPECTOMY INTESTINAL  Patient location during evaluation: PACU Anesthesia Type: General Level of consciousness: awake and alert Pain management: pain level controlled Vital Signs Assessment: post-procedure vital signs reviewed and stable Respiratory status: spontaneous breathing, nonlabored ventilation, respiratory function stable and patient connected to nasal cannula oxygen Cardiovascular status: blood pressure returned to baseline and stable Postop Assessment: no apparent nausea or vomiting Anesthetic complications: no Comments: Late entry   No notable events documented.   Last Vitals:  Vitals:   10/01/22 0714 10/01/22 0834  BP: (!) 144/90 (!) 90/58  Pulse: 63 (!) 57  Resp: 16 15  Temp: 36.9 C 36.5 C  SpO2: 98% 98%    Last Pain:  Vitals:   10/01/22 0834  TempSrc: Oral  PainSc:                  Windell Norfolk

## 2022-10-04 ENCOUNTER — Encounter (INDEPENDENT_AMBULATORY_CARE_PROVIDER_SITE_OTHER): Payer: Self-pay | Admitting: *Deleted

## 2022-10-05 ENCOUNTER — Encounter (INDEPENDENT_AMBULATORY_CARE_PROVIDER_SITE_OTHER): Payer: Self-pay | Admitting: *Deleted

## 2022-10-06 ENCOUNTER — Encounter (HOSPITAL_COMMUNITY): Payer: Self-pay | Admitting: Gastroenterology

## 2022-10-15 ENCOUNTER — Other Ambulatory Visit: Payer: Self-pay | Admitting: Nurse Practitioner

## 2022-10-15 ENCOUNTER — Ambulatory Visit (INDEPENDENT_AMBULATORY_CARE_PROVIDER_SITE_OTHER): Payer: Medicare Other | Admitting: Internal Medicine

## 2022-10-15 ENCOUNTER — Encounter: Payer: Self-pay | Admitting: Internal Medicine

## 2022-10-15 VITALS — BP 137/78 | HR 75 | Ht 71.0 in | Wt 183.4 lb

## 2022-10-15 DIAGNOSIS — K219 Gastro-esophageal reflux disease without esophagitis: Secondary | ICD-10-CM

## 2022-10-15 DIAGNOSIS — E291 Testicular hypofunction: Secondary | ICD-10-CM | POA: Diagnosis not present

## 2022-10-15 DIAGNOSIS — I7121 Aneurysm of the ascending aorta, without rupture: Secondary | ICD-10-CM

## 2022-10-15 DIAGNOSIS — Z1329 Encounter for screening for other suspected endocrine disorder: Secondary | ICD-10-CM

## 2022-10-15 DIAGNOSIS — D751 Secondary polycythemia: Secondary | ICD-10-CM | POA: Diagnosis not present

## 2022-10-15 DIAGNOSIS — Z131 Encounter for screening for diabetes mellitus: Secondary | ICD-10-CM

## 2022-10-15 DIAGNOSIS — Z1321 Encounter for screening for nutritional disorder: Secondary | ICD-10-CM

## 2022-10-15 DIAGNOSIS — Z13220 Encounter for screening for lipoid disorders: Secondary | ICD-10-CM

## 2022-10-15 DIAGNOSIS — I714 Abdominal aortic aneurysm, without rupture, unspecified: Secondary | ICD-10-CM

## 2022-10-15 NOTE — Assessment & Plan Note (Signed)
Followed by urology (Dr. Annabell Howells).  Receives testosterone pellets.  No changes are indicated today.

## 2022-10-15 NOTE — Assessment & Plan Note (Signed)
Symptoms are adequately controlled with Protonix 40 mg daily.

## 2022-10-15 NOTE — Patient Instructions (Signed)
It was a pleasure to see you today.  Thank you for giving [place] the opportunity to be involved in your care.  Below is a brief recap of your visit and next steps.  We will plan to see you again in 1 year.  Summary No medication changes made today We will update labs Follow up in 1 year

## 2022-10-15 NOTE — Progress Notes (Signed)
Established Patient Office Visit  Subjective   Patient ID: Tim Rogers, male    DOB: [birth year]  Age: 67 y.o. MRN: [id number]  Chief Complaint  Patient presents with   Annual Exam   Tim Rogers returns to care today for routine follow-up.  He was last evaluated in August 2023.  In the interim he has been seen by urology, cardiology, and gastroenterology for follow-up.  He underwent screening colonoscopy earlier this month with Dr. Levon Hedger.  Several polyps were removed.  Repeat colonoscopy is recommended for 3 years.  There have otherwise been no acute interval events.  Tim Rogers reports feeling well today.  He is asymptomatic currently and has no acute concerns to discuss.  Past Medical History:  Diagnosis Date   Acid reflux    Anxiety    2003   Ascending aortic aneurysm (HCC)    GERD (gastroesophageal reflux disease)    Low testosterone    PVCs (premature ventricular contractions)    Past Surgical History:  Procedure Laterality Date   CHOLECYSTECTOMY     COLONOSCOPY WITH PROPOFOL N/A 10/01/2022   Procedure: COLONOSCOPY WITH PROPOFOL;  Surgeon: Dolores Frame, MD;  Location: [facility];  Service: Gastroenterology;  Laterality: N/A;  8:15AM;ASA 1   ESOPHAGOGASTRODUODENOSCOPY N/A 09/21/2017   Procedure: ESOPHAGOGASTRODUODENOSCOPY (EGD);  Surgeon: Malissa Hippo, MD;  Location: [facility];  Service: Endoscopy;  Laterality: N/A;  2:55   POLYPECTOMY  10/01/2022   Procedure: POLYPECTOMY INTESTINAL;  Surgeon: Dolores Frame, MD;  Location: [facility];  Service: Gastroenterology;;   TIF procedure     Social History   Tobacco Use   Smoking status: Never   Smokeless tobacco: Former    Types: Snuff  Vaping Use   Vaping status: Never Used  Substance Use Topics   Alcohol use: Yes    Alcohol/week: 5.0 standard drinks of alcohol    Types: 5 Cans of beer per week    Comment: couple of beer sometimes daily   Drug use: No   Family History   Problem Relation Age of Onset   Alcohol abuse Mother    Cancer Mother        lung   COPD Mother    Heart disease Mother    Early death Mother        age 9 multiple factors   Cancer Sister        breast   Allergies  Allergen Reactions   Lidocaine-Epinephrine Shortness Of Breath, Anxiety and Palpitations   Review of Systems  Constitutional:  Negative for chills and fever.  HENT:  Negative for sore throat.   Respiratory:  Negative for cough and shortness of breath.   Cardiovascular:  Negative for chest pain, palpitations and leg swelling.  Gastrointestinal:  Negative for abdominal pain, blood in stool, constipation, diarrhea, nausea and vomiting.  Genitourinary:  Negative for dysuria and hematuria.  Musculoskeletal:  Negative for myalgias.  Skin:  Negative for itching and rash.  Neurological:  Negative for dizziness and headaches.  Psychiatric/Behavioral:  Negative for depression and suicidal ideas.      Objective:     BP 137/78 (BP Location: Right Arm, Patient Position: Sitting, Cuff Size: Normal)   Pulse 75   Ht 5\' 11"  (1.803 m)   Wt 183 lb 6.4 oz (83.2 kg)   SpO2 96%   BMI 25.58 kg/m  BP Readings from Last 3 Encounters:  10/15/22 137/78  10/01/22 (!) 90/58  03/18/22 123/82   Physical Exam  Vitals reviewed.  Constitutional:      General: He is not in acute distress.    Appearance: Normal appearance. He is not ill-appearing.  HENT:     Head: Normocephalic and atraumatic.     Right Ear: External ear normal.     Left Ear: External ear normal.     Nose: Nose normal. No congestion or rhinorrhea.     Mouth/Throat:     Mouth: Mucous membranes are moist.     Pharynx: Oropharynx is clear.  Eyes:     General: No scleral icterus.    Extraocular Movements: Extraocular movements intact.     Conjunctiva/sclera: Conjunctivae normal.     Pupils: Pupils are equal, round, and reactive to light.  Cardiovascular:     Rate and Rhythm: Normal rate and regular rhythm.      Pulses: Normal pulses.     Heart sounds: Normal heart sounds. No murmur heard. Pulmonary:     Effort: Pulmonary effort is normal.     Breath sounds: Normal breath sounds. No wheezing, rhonchi or rales.  Abdominal:     General: Abdomen is flat. Bowel sounds are normal. There is no distension.     Palpations: Abdomen is soft.     Tenderness: There is no abdominal tenderness.  Musculoskeletal:        General: No swelling or deformity. Normal range of motion.     Cervical back: Normal range of motion.  Skin:    General: Skin is warm and dry.     Capillary Refill: Capillary refill takes less than 2 seconds.  Neurological:     General: No focal deficit present.     Mental Status: He is alert and oriented to person, place, and time.     Motor: No weakness.  Psychiatric:        Mood and Affect: Mood normal.        Behavior: Behavior normal.        Thought Content: Thought content normal.   Last CBC Lab Results  Component Value Date   WBC 7.4 01/06/2021   HGB 17.0 06/17/2022   HCT 51.6 (H) 06/17/2022   MCV 88 01/06/2021   MCH 30.2 01/06/2021   RDW 12.2 01/06/2021   PLT 253 01/06/2021   Last metabolic panel Lab Results  Component Value Date   GLUCOSE 56 (L) 01/05/2022   NA 139 01/05/2022   K 4.0 01/05/2022   CL 103 01/05/2022   CO2 28 01/05/2022   BUN 14 01/05/2022   CREATININE 1.15 01/05/2022   GFRNONAA >60 01/05/2022   CALCIUM 9.1 01/05/2022   PROT 7.2 05/15/2021   ALBUMIN 4.4 06/18/2021   LABGLOB 2.6 05/15/2021   AGRATIO 1.8 05/15/2021   BILITOT 1.1 05/15/2021   ALKPHOS 95 05/15/2021   AST 21 05/15/2021   ALT 26 05/15/2021   ANIONGAP 8 01/05/2022   Last thyroid functions Lab Results  Component Value Date   TSH 1.27 11/15/2018   T4TOTAL 8.4 11/15/2018   Last vitamin D Lab Results  Component Value Date   VD25OH 70 05/13/2020     Assessment & Plan:   Problem List Items Addressed This Visit       Thoracic ascending aortic aneurysm (HCC) - Primary     Stable on latest imaging from December 2023, 4.2 cm.  Followed by cardiology.  Currently prescribed amlodipine 10 mg daily.  No changes are indicated today.  Continue annual surveillance.      Chronic GERD    Symptoms are  adequately controlled with Protonix 40 mg daily.      Hypogonadism in male    Followed by urology (Dr. Annabell Howells).  Receives testosterone pellets.  No changes are indicated today.      Return in about 1 year (around 10/15/2023) for CPE.   Billie Lade, MD

## 2022-10-15 NOTE — Assessment & Plan Note (Signed)
Stable on latest imaging from December 2023, 4.2 cm.  Followed by cardiology.  Currently prescribed amlodipine 10 mg daily.  No changes are indicated today.  Continue annual surveillance.

## 2022-10-16 ENCOUNTER — Other Ambulatory Visit: Payer: Self-pay | Admitting: Cardiology

## 2022-10-16 LAB — CMP14+EGFR
ALT: 17 IU/L (ref 0–44)
AST: 23 IU/L (ref 0–40)
Albumin: 4.2 g/dL (ref 3.9–4.9)
Alkaline Phosphatase: 97 IU/L (ref 44–121)
BUN/Creatinine Ratio: 14 (ref 10–24)
BUN: 17 mg/dL (ref 8–27)
Bilirubin Total: 1 mg/dL (ref 0.0–1.2)
CO2: 21 mmol/L (ref 20–29)
Calcium: 9.3 mg/dL (ref 8.6–10.2)
Chloride: 100 mmol/L (ref 96–106)
Creatinine, Ser: 1.18 mg/dL (ref 0.76–1.27)
Globulin, Total: 2.5 g/dL (ref 1.5–4.5)
Glucose: 97 mg/dL (ref 70–99)
Potassium: 4.2 mmol/L (ref 3.5–5.2)
Sodium: 137 mmol/L (ref 134–144)
Total Protein: 6.7 g/dL (ref 6.0–8.5)
eGFR: 67 mL/min/{1.73_m2} (ref >59)

## 2022-10-16 LAB — CBC WITH DIFFERENTIAL/PLATELET
Basophils Absolute: 0.1 10*3/uL (ref 0.0–0.2)
Basos: 1 %
EOS (ABSOLUTE): 0.2 10*3/uL (ref 0.0–0.4)
Eos: 4 %
Hematocrit: 48.1 % (ref 37.5–51.0)
Hemoglobin: 16.7 g/dL (ref 13.0–17.7)
Immature Grans (Abs): 0 10*3/uL (ref 0.0–0.1)
Immature Granulocytes: 1 %
Lymphocytes Absolute: 1.8 10*3/uL (ref 0.7–3.1)
Lymphs: 31 %
MCH: 30.8 pg (ref 26.6–33.0)
MCHC: 34.7 g/dL (ref 31.5–35.7)
MCV: 89 fL (ref 79–97)
Monocytes Absolute: 0.5 10*3/uL (ref 0.1–0.9)
Monocytes: 8 %
Neutrophils Absolute: 3.3 10*3/uL (ref 1.4–7.0)
Neutrophils: 55 %
Platelets: 245 10*3/uL (ref 150–450)
RBC: 5.43 x10E6/uL (ref 4.14–5.80)
RDW: 13.1 % (ref 11.6–15.4)
WBC: 5.9 10*3/uL (ref 3.4–10.8)

## 2022-10-16 LAB — B12 AND FOLATE PANEL
Folate: >20 ng/mL (ref >3.0)
Vitamin B-12: 1049 pg/mL (ref 232–1245)

## 2022-10-16 LAB — HEMOGLOBIN A1C
Est. average glucose Bld gHb Est-mCnc: 100 mg/dL
Hgb A1c MFr Bld: 5.1 % (ref 4.8–5.6)

## 2022-10-16 LAB — LIPID PANEL
Chol/HDL Ratio: 4.3 ratio (ref 0.0–5.0)
Cholesterol, Total: 162 mg/dL (ref 100–199)
HDL: 38 mg/dL — ABNORMAL LOW (ref >39)
LDL Chol Calc (NIH): 110 mg/dL — ABNORMAL HIGH (ref 0–99)
Triglycerides: 74 mg/dL (ref 0–149)
VLDL Cholesterol Cal: 14 mg/dL (ref 5–40)

## 2022-10-16 LAB — TSH+FREE T4
Free T4: 1.57 ng/dL (ref 0.82–1.77)
TSH: 1.82 u[IU]/mL (ref 0.450–4.500)

## 2022-10-21 ENCOUNTER — Ambulatory Visit: Payer: Medicare Other | Admitting: Urology

## 2022-10-21 ENCOUNTER — Telehealth: Payer: Self-pay

## 2022-10-21 NOTE — Telephone Encounter (Signed)
Patient called and made aware appointment will need to be rescheduled due to medication not being in office. Patient request a call back from you. Patient made aware a message will be sent.

## 2022-10-28 ENCOUNTER — Ambulatory Visit: Payer: Medicare Other | Admitting: Urology

## 2022-11-11 ENCOUNTER — Telehealth: Payer: Self-pay

## 2022-11-11 ENCOUNTER — Ambulatory Visit (INDEPENDENT_AMBULATORY_CARE_PROVIDER_SITE_OTHER): Payer: Medicare Other | Admitting: Urology

## 2022-11-11 VITALS — BP 127/85 | HR 75

## 2022-11-11 DIAGNOSIS — D751 Secondary polycythemia: Secondary | ICD-10-CM

## 2022-11-11 DIAGNOSIS — E291 Testicular hypofunction: Secondary | ICD-10-CM

## 2022-11-11 MED ORDER — TESTOSTERONE 20 % CREA: TOPICAL_CREAM | 2 refills | Status: DC

## 2022-11-11 NOTE — Progress Notes (Signed)
Subjective: 1. Hypogonadism in male   2. Secondary polycythemia      11/08/22: Tim Rogers returns today in f/u.  His last T was 861 in 4/24.  His Hgb on 10/15/22 was 16.7.   He is for a testopel reimplant.  He had two appointments cancelled because of the lack of pellets.   03/18/22: Tim Rogers returns today for testopel replacement.  His T is 572 with a Hgb of 16.8 which is down some.  He can tell the pellets are depleting since his anxiety is recurring. He has no other complatins.  His PSA remains low at 0.8.   12/03/21: Tim Rogers returns today in f/u for his history noted below.  His T is 812 and his Hgb is 17.3.  He hasn't had to donate blood in 9 months.   His libido remains low but his energy is good.  He has good erectile function.  He is voiding well with no nocturia.    08/27/21: Tim Rogers returns today for testopel insertion for his history noted below.  His last labs were in 06/18/21 and his T was 765 and Hgb was 17.1.   05/14/21: Tim Rogers returns today in f/u for his history of hypogonadism and secondary polycythemia.   He had Testopel on 12/22 and his T is 628 with a Hgb of 17.7 and Hct 51.6%.  He is doing well on treatment but didn't get the "kick" that he got with the injections.  His PSA is 1.1.    02/19/21: Tim Rogers returns today in f/u for a testopel implant.  His Hgb is down to normal after two phlebotomies.    11/27/20: Tim Rogers returns today in f/u.  He reports he is doing well and the discomfort with ejaculation has improved and is minimal.  A renal stone study shows some hepatic cysts and prostate stones but no other significant findings.  His IPSS is 0 and his UA is clear.  He remains on TRT with topicals but was interested in testopel.  He does report a prior history of elevated Hemoglobins with TRT.    Gu Hx: Tim Rogers is a 67 yo male who is sent for evaluation of perineal pain.  He got an antibiotic for 5 days and his symptoms recurred.  He got another 7 days but the symptoms recurred.  He has now been on the bactrim  for 7 weeks.  He has no further pain with ejaculation but he still can have some suprapubic discomfort.  He has increased symptoms with tea or diet coke.  Beer will also impact it.  He is voiding well with an IPSS of 1.  He has minimal change in his bowel habits.   He has had no documented stones or GU surgery.  He thinks he may have passed a stone 4 weeks ago which he had a sharp burning at the tip of the penis x 1. He had a CT AP in 2006 and there were no stones.  He had UA's on 5/24 and 7/13 that had 0-2 RBC's on one.  He has 0-2 RBC's today.   HIs PSA was 1.1 on 05/14/20.  He is on TRT currently with a topical but he had been getting pellets from [facility].  He takes a lot of Vitamins.  His most recent T is about 1000.   ROS:  Review of Systems  Psychiatric/Behavioral:  The patient is nervous/anxious.   All other systems reviewed and are negative.   Allergies  Allergen Reactions   Lidocaine-Epinephrine Shortness  Of Breath, Anxiety and Palpitations    Past Medical History:  Diagnosis Date   Acid reflux    Anxiety    2003   Ascending aortic aneurysm (HCC)    GERD (gastroesophageal reflux disease)    Low testosterone    PVCs (premature ventricular contractions)     Past Surgical History:  Procedure Laterality Date   CHOLECYSTECTOMY     COLONOSCOPY WITH PROPOFOL N/A 10/01/2022   Procedure: COLONOSCOPY WITH PROPOFOL;  Surgeon: Dolores Frame, MD;  [facility];  Service: Gastroenterology;  Laterality: N/A;  8:15AM;ASA 1   ESOPHAGOGASTRODUODENOSCOPY N/A 09/21/2017   Procedure: ESOPHAGOGASTRODUODENOSCOPY (EGD);  Surgeon: Malissa Hippo, MD;  [facility];  Service: Endoscopy;  Laterality: N/A;  2:55   POLYPECTOMY  10/01/2022   Procedure: POLYPECTOMY INTESTINAL;  Surgeon: Dolores Frame, MD;  Location: [facility];  Service: Gastroenterology;;   TIF procedure      Social History   Socioeconomic History   Marital status: Single     Spouse name: Not on file   Number of children: 0   Years of education: 74   Highest education level: Master's degree (e.g., MA, MS, MEng, MEd, MSW, MBA)  Occupational History   Occupation: retired  Tobacco Use   Smoking status: Never   Smokeless tobacco: Former    Types: Snuff  Vaping Use   Vaping status: Never Used  Substance and Sexual Activity   Alcohol use: Yes    Alcohol/week: 5.0 standard drinks of alcohol    Types: 5 Cans of beer per week    Comment: couple of beer sometimes daily   Drug use: No   Sexual activity: Not Currently    Birth control/protection: None  Other Topics Concern   Not on file  Social History Narrative   Retired.Single.Lives alone.   Ex-[employer].   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   Social Determinants of Health   Financial Resource Strain: Low Risk  (10/11/2022)   Overall Financial Resource Strain (CARDIA)    Difficulty of Paying Living Expenses: Not hard at all  Food Insecurity: No Food Insecurity (10/11/2022)   Hunger Vital Sign    Worried About Running Out of Food in the Last Year: Never true    Ran Out of Food in the Last Year: Never true  Transportation Needs: No Transportation Needs (10/11/2022)   PRAPARE - Administrator, Civil Service (Medical): No    Lack of Transportation (Non-Medical): No  Physical Activity: Sufficiently Active (10/11/2022)   Exercise Vital Sign    Days of Exercise per Week: 5 days    Minutes of Exercise per Session: 80 min  Stress: No Stress Concern Present (10/11/2022)   [employer] of Occupational Health - Occupational Stress Questionnaire    Feeling of Stress : Only a little  Social Connections: Moderately Integrated (10/11/2022)   Social Connection and Isolation Panel [NHANES]    Frequency of Communication with Friends and Family: More than three times a week    Frequency of Social Gatherings with Friends and  Family: More than three times a week    Attends Religious Services: More than 4 times per year    Active Member of [employer] or Organizations: Yes    Attends Engineer, structural: More than 4 times per year    Marital Status: Never married  Intimate Partner Violence:  Not on file    Family History  Problem Relation Age of Onset   Alcohol abuse Mother    Cancer Mother        lung   COPD Mother    Heart disease Mother    Early death Mother        age 45 multiple factors   Cancer Sister        breast    Anti-infectives: Anti-infectives (From admission, onward)    None       Current Outpatient Medications  Medication Sig Dispense Refill   amLODipine (NORVASC) 10 MG tablet TAKE 1 TABLET(10 MG) BY MOUTH DAILY 90 tablet 1   azelastine (ASTELIN) 0.1 % nasal spray Place 1 spray into both nostrils 2 (two) times daily. 30 mL 3   Cholecalciferol (VITAMIN D-3) 125 MCG (5000 UT) TABS Take 10,000 Units by mouth daily at 12 noon.     hydrocortisone (ANUSOL-HC) 2.5 % rectal cream Place 1 application rectally 2 (two) times daily. 28 g 2   loratadine (CLARITIN) 10 MG tablet Take 10 mg by mouth daily.     OVER THE COUNTER MEDICATION AG1 - one daily     pantoprazole (PROTONIX) 40 MG tablet TAKE 1 TABLET BY MOUTH EVERY DAY AS NEEDED 90 tablet 1   Testosterone 20 % CREA Apply 3 clicks topically qam. 100 g 2   valACYclovir (VALTREX) 1000 MG tablet Take 1,000 mg by mouth 2 (two) times daily.     No current facility-administered medications for this visit.     Objective: Vital signs in last 24 hours: BP 127/85   Pulse 75   Intake/Output from previous day: No intake/output data recorded. Intake/Output this shift: @IOTHISSHIFT @   Physical Exam Vitals reviewed.  Constitutional:      Appearance: Normal appearance.  Neurological:     Mental Status: He is alert.     Lab Results:  Recent Results (from the past 2160 hour(s))  HM COLONOSCOPY     Status: None   Collection Time:  10/01/22 12:00 AM  Result Value Ref Range   HM Colonoscopy See Report (in chart) See Report (in chart), Patient Reported  Surgical pathology     Status: None   Collection Time: 10/01/22  8:11 AM  Result Value Ref Range   SURGICAL PATHOLOGY      SURGICAL PATHOLOGY CASE: [id number] PATIENT: Tim Rogers Surgical Pathology Report     Clinical History: Screening (crm)     FINAL MICROSCOPIC DIAGNOSIS:  A. COLON, DESCENDING, RECTUM, POLYPECTOMY:      Hyperplastic polyp. Negative for dysplasia.  B. COLON, CECUM, ASCENDING, TRANSVERSE, POLYPECTOMY:      Tubular adenoma.      Negative for high-grade dysplasia.  GROSS DESCRIPTION:  A: Received in formalin are tan, soft tissue fragments that are submitted in toto. Number: 4 size: 0.5-1.2 cm blocks: 1  B: Received in formalin are tan, soft tissue fragments that are submitted in toto. Number: 5 size: 0.3-0.6 cm blocks: 1 (GRP 10/01/2022)   Final Diagnosis performed by Lance Coon, MD.   Electronically signed 10/04/2022 Technical component performed at [facility], [address], [place], [place] [address].  Professional component performed at [employer]. [facility], [address], [place], [place] [address].  Immunohistoc Science writer component (if applicable) was performed at [facility]. [address], [address], [place], [place] [address].   IMMUNOHISTOCHEMISTRY DISCLAIMER (if applicable): Some of these immunohistochemical stains may have been developed and the performance  characteristics determine by [facility]. Some may not have been cleared or approved by the U.S. Food and Drug Administration. The FDA has determined that such clearance or approval is not necessary. This test is used for clinical purposes. It should not be regarded as investigational or for research. This laboratory is certified under the Clinical Laboratory Improvement Amendments of  1988 (CLIA-88) as qualified to perform high complexity clinical laboratory testing.  The controls stained appropriately.   IHC stains are performed on formalin fixed, paraffin embedded tissue using a 3,3"diaminobenzidine (DAB) chromogen and Leica Bond Autostainer System. The staining intensity of the  nucleus is score manually and is reported as the percentage of tumor cell nuclei demonstrating specific nuclear staining. The specimens are fixed in 10% Neutral Formalin for at least 6 hours and up to 72hrs. These tests are validated on decalcified tissue. Results should be interpreted with caution given the possibility of false negative results on decalcified specimens. Antibody Clones are as follows ER-clone 60F, PR-clone 16, Ki67- clone MM1. Some of these immunohistochemical stains may have been developed and the performance characteristics determined by [facility].   B12 and Folate Panel     Status: None   Collection Time: 10/15/22  8:44 AM  Result Value Ref Range   Vitamin B-12 1,049 232 - 1,245 pg/mL   Folate >20.0 >3.0 ng/mL    Comment: A serum folate concentration of less than 3.1 ng/mL is considered to represent clinical deficiency.   TSH + free T4     Status: None   Collection Time: 10/15/22  8:44 AM  Result Value Ref Range   TSH 1.820 0.450 - 4.500 uIU/mL   Free T4 1.57 0.82 - 1.77 ng/dL  Hemoglobin U2V     Status: None   Collection Time: 10/15/22  8:44 AM  Result Value Ref Range   Hgb A1c MFr Bld 5.1 4.8 - 5.6 %    Comment:          Prediabetes: 5.7 - 6.4          Diabetes: >6.4          Glycemic control for adults with diabetes: <7.0    Est. average glucose Bld gHb Est-mCnc 100 mg/dL  Lipid panel     Status: Abnormal   Collection Time: 10/15/22  8:44 AM  Result Value Ref Range   Cholesterol, Total 162 100 - 199 mg/dL   Triglycerides 74 0 - 149 mg/dL   HDL 38 (L) >25 mg/dL   VLDL Cholesterol Cal 14 5 - 40 mg/dL   LDL Chol Calc (NIH) 366 (H) 0 - 99 mg/dL    Chol/HDL Ratio 4.3 0.0 - 5.0 ratio    Comment:                                   T. Chol/HDL Ratio                                             Men  Women                               1/2 Avg.Risk  3.4    3.3  Avg.Risk  5.0    4.4                                2X Avg.Risk  9.6    7.1                                3X Avg.Risk 23.4   11.0   CMP14+EGFR     Status: None   Collection Time: 10/15/22  8:44 AM  Result Value Ref Range   Glucose 97 70 - 99 mg/dL   BUN 17 8 - 27 mg/dL   Creatinine, Ser 6.04 0.76 - 1.27 mg/dL   eGFR 67 >54 UJ/WJX/9.14   BUN/Creatinine Ratio 14 10 - 24   Sodium 137 134 - 144 mmol/L   Potassium 4.2 3.5 - 5.2 mmol/L   Chloride 100 96 - 106 mmol/L   CO2 21 20 - 29 mmol/L   Calcium 9.3 8.6 - 10.2 mg/dL   Total Protein 6.7 6.0 - 8.5 g/dL   Albumin 4.2 3.9 - 4.9 g/dL   Globulin, Total 2.5 1.5 - 4.5 g/dL   Bilirubin Total 1.0 0.0 - 1.2 mg/dL   Alkaline Phosphatase 97 44 - 121 IU/L   AST 23 0 - 40 IU/L   ALT 17 0 - 44 IU/L  CBC with Differential/Platelet     Status: None   Collection Time: 10/15/22  8:44 AM  Result Value Ref Range   WBC 5.9 3.4 - 10.8 x10E3/uL   RBC 5.43 4.14 - 5.80 x10E6/uL   Hemoglobin 16.7 13.0 - 17.7 g/dL   Hematocrit 78.2 95.6 - 51.0 %   MCV 89 79 - 97 fL   MCH 30.8 26.6 - 33.0 pg   MCHC 34.7 31.5 - 35.7 g/dL   RDW 21.3 08.6 - 57.8 %   Platelets 245 150 - 450 x10E3/uL   Neutrophils 55 Not Estab. %   Lymphs 31 Not Estab. %   Monocytes 8 Not Estab. %   Eos 4 Not Estab. %   Basos 1 Not Estab. %   Neutrophils Absolute 3.3 1.4 - 7.0 x10E3/uL   Lymphocytes Absolute 1.8 0.7 - 3.1 x10E3/uL   Monocytes Absolute 0.5 0.1 - 0.9 x10E3/uL   EOS (ABSOLUTE) 0.2 0.0 - 0.4 x10E3/uL   Basophils Absolute 0.1 0.0 - 0.2 x10E3/uL   Immature Granulocytes 1 Not Estab. %   Immature Grans (Abs) 0.0 0.0 - 0.1 x10E3/uL   .brief  BMET No results for input(s): "NA", "K", "CL", "CO2", "GLUCOSE", "BUN", "CREATININE",  "CALCIUM" in the last 72 hours. PT/INR No results for input(s): "LABPROT", "INR" in the last 72 hours. ABG No results for input(s): "PHART", "HCO3" in the last 72 hours.  Invalid input(s): "PCO2", "PO2"   Studies/Results: US AORTA  Result Date: 08/24/2022 CLINICAL DATA:  History of hypertension. EXAM: ULTRASOUND OF ABDOMINAL AORTA TECHNIQUE: Ultrasound examination of the abdominal aorta and proximal common iliac arteries was performed to evaluate for aneurysm. Additional color and Doppler images of the distal aorta were obtained to document patency. COMPARISON:  None Available. FINDINGS: Abdominal aortic measurements as follows: Proximal:  3.1 x 2.8 cm Mid:  2.6 x 2.7 cm Distal:  2.2 x 2.3 cm Patent: Yes, peak systolic velocity is 77 cm/s Right common iliac artery: 1.6 cm Left common iliac artery: 1.4 cm IMPRESSION: The proximal abdominal aorta is ectatic measuring up to 3.1  cm. Recommend follow-up ultrasound every 3 years. Electronically Signed   By: Gerome Sam III M.D.   On: 08/24/2022 18:14     Procedure:  Testopel implant.  The left upper buttock was prepped with betadine and draped with sterile towels.  The insertion site was chosen and then infiltrated with 12ml of 2% lidocaine without epinepherine.  A small stab wound was made with the 15 blade and the insertion trocar was the passed into the subcutaneous fat.  6 Testopel pellets were then deployed one at a time.   The trocar was removed and then the wound was cleaned with alcohol which was then was closed with overlapping 1/2 inch steristrips.  A dressing of 4x4's and hypofix was applied and he was then rolled supine where he lay for 5 minutes to apply pressure to the wound.   There was mild oozing but  no other complications.   Assessment/Plan:  Hypogonadism.  He was doing well on testopel but it remains out of stock.  I will give him testosterone cream for the time being and will try to get the pellets in.   History of acquired  polycythemia.   H&H is 16.7 .   Meds ordered this encounter  Medications   Testosterone 20 % CREA    Sig: Apply 3 clicks topically qam.    Dispense:  100 g    Refill:  2    He has been on this previously.     Orders Placed This Encounter  Procedures   Testosterone    Standing Status:   Future    Standing Expiration Date:   05/11/2023   Hemoglobin and hematocrit, blood    Standing Status:   Future    Standing Expiration Date:   05/11/2023     Return for He needs to return for testopel when it is available. Bjorn Pippin 11/11/2022  Patient ID: Madelin Headings, male   DOB: 02/17/1956, 66 y.o.   MRN: [id number]

## 2022-11-11 NOTE — Telephone Encounter (Addendum)
Patient requested a next day appointment Tuesday and Thursday pm and Monday and Friday mornings am when testopel is available.  He will take next available MD, does not have to be scheduled with Dr. Annabell Howells.

## 2022-11-12 NOTE — Telephone Encounter (Signed)
I spoke with Tim Rogers on the phone 11/11/2022 afternoon.  We discussed that we would call him to reschedule once the medication arrived into the office. Patient voiced understanding. I apologized to patient for the scheduling inconvenience and note having the medication available. Patient stated " I know situations arise" patient was understanding of the delay and will wait for our office to reach out to schedule once we received medication.,

## 2022-11-23 ENCOUNTER — Encounter (HOSPITAL_COMMUNITY): Payer: Self-pay | Admitting: Gastroenterology

## 2022-11-23 ENCOUNTER — Telehealth: Payer: Self-pay

## 2022-11-23 NOTE — Telephone Encounter (Signed)
I left a voicemail and requested a call back.  Patient will need to confirm upcoming appt with Dr. Retta Diones on 10/01 at 2:15pm for testopel.

## 2022-11-29 ENCOUNTER — Ambulatory Visit (INDEPENDENT_AMBULATORY_CARE_PROVIDER_SITE_OTHER): Payer: Medicare Other

## 2022-11-29 VITALS — Ht 71.0 in | Wt 180.0 lb

## 2022-11-29 DIAGNOSIS — Z0000 Encounter for general adult medical examination without abnormal findings: Secondary | ICD-10-CM

## 2022-11-29 NOTE — Patient Instructions (Addendum)
[name] , Thank you for taking time to come for your Medicare Wellness Visit. I appreciate your ongoing commitment to your health goals. Please review the following plan we discussed and let me know if I can assist you in the future.   Referrals/Orders/Follow-Ups/Clinician Recommendations:  Next Medicare Annual Wellness Visit: March 07, 2024 at 9:00am virtual visit  This is a list of the screening recommended for you and due dates:  Health Maintenance  Topic Date Due   Zoster (Shingles) Vaccine (2 of 2) 01/29/2017   Pneumonia Vaccine (3 of 3 - PPSV23 or PCV20) 12/04/2021   Flu Shot  09/30/2022   COVID-19 Vaccine (4 - 2023-24 season) 10/31/2022   Medicare Annual Wellness Visit  11/29/2023   Colon Cancer Screening  09/30/2025   DTaP/Tdap/Td vaccine (2 - Td or Tdap) 08/19/2029   Hepatitis C Screening  Completed   HPV Vaccine  Aged Out    Advanced directives: (ACP Link)Information on Advanced Care Planning can be found at [facility] [place] Advance Health Care Directives Advance Health Care Directives ([email or web address]   Next Medicare Annual Wellness Visit scheduled for next year: Yes  Preventive Care 65 Years and Older, Male Preventive care refers to lifestyle choices and visits with your health care provider that can promote health and wellness. Preventive care visits are also called wellness exams. What can I expect for my preventive care visit? Counseling During your preventive care visit, your health care provider may ask about your: Medical history, including: Past medical problems. Family medical history. History of falls. Current health, including: Emotional well-being. Home life and relationship well-being. Sexual activity. Memory and ability to understand (cognition). Lifestyle, including: Alcohol, nicotine or tobacco, and drug use. Access to firearms. Diet, exercise, and sleep habits. Work and work Astronomer. Sunscreen use. Safety issues such as  seatbelt and bike helmet use. Physical exam Your health care provider will check your: Height and weight. These may be used to calculate your BMI (body mass index). BMI is a measurement that tells if you are at a healthy weight. Waist circumference. This measures the distance around your waistline. This measurement also tells if you are at a healthy weight and may help predict your risk of certain diseases, such as type 2 diabetes and high blood pressure. Heart rate and blood pressure. Body temperature. Skin for abnormal spots. What immunizations do I need?  Vaccines are usually given at various ages, according to a schedule. Your health care provider will recommend vaccines for you based on your age, medical history, and lifestyle or other factors, such as travel or where you work. What tests do I need? Screening Your health care provider may recommend screening tests for certain conditions. This may include: Lipid and cholesterol levels. Diabetes screening. This is done by checking your blood sugar (glucose) after you have not eaten for a while (fasting). Hepatitis C test. Hepatitis B test. HIV (human immunodeficiency virus) test. STI (sexually transmitted infection) testing, if you are at risk. Lung cancer screening. Colorectal cancer screening. Prostate cancer screening. Abdominal aortic aneurysm (AAA) screening. You may need this if you are a current or former smoker. Talk with your health care provider about your test results, treatment options, and if necessary, the need for more tests. Follow these instructions at home: Eating and drinking  Eat a diet that includes fresh fruits and vegetables, whole grains, lean protein, and low-fat dairy products. Limit your intake of foods with high amounts of sugar, saturated fats, and salt. Take vitamin  and mineral supplements as recommended by your health care provider. Do not drink alcohol if your health care provider tells you not to  drink. If you drink alcohol: Limit how much you have to 0-2 drinks a day. Know how much alcohol is in your drink. In the [place] one drink equals one 12 oz bottle of beer (355 mL), one 5 oz glass of wine (148 mL), or one 1 oz glass of hard liquor (44 mL). Lifestyle Brush your teeth every morning and night with fluoride toothpaste. Floss one time each day. Exercise for at least 30 minutes 5 or more days each week. Do not use any products that contain nicotine or tobacco. These products include cigarettes, chewing tobacco, and vaping devices, such as e-cigarettes. If you need help quitting, ask your health care provider. Do not use drugs. If you are sexually active, practice safe sex. Use a condom or other form of protection to prevent STIs. Take aspirin only as told by your health care provider. Make sure that you understand how much to take and what form to take. Work with your health care provider to find out whether it is safe and beneficial for you to take aspirin daily. Ask your health care provider if you need to take a cholesterol-lowering medicine (statin). Find healthy ways to manage stress, such as: Meditation, yoga, or listening to music. Journaling. Talking to a trusted person. Spending time with friends and family. Safety Always wear your seat belt while driving or riding in a vehicle. Do not drive: If you have been drinking alcohol. Do not ride with someone who has been drinking. When you are tired or distracted. While texting. If you have been using any mind-altering substances or drugs. Wear a helmet and other protective equipment during sports activities. If you have firearms in your house, make sure you follow all gun safety procedures. Minimize exposure to UV radiation to reduce your risk of skin cancer. What's next? Visit your health care provider once a year for an annual wellness visit. Ask your health care provider how often you should have your eyes and teeth  checked. Stay up to date on all vaccines. This information is not intended to replace advice given to you by your health care provider. Make sure you discuss any questions you have with your health care provider. Document Revised: 08/13/2020 Document Reviewed: 08/13/2020 Elsevier Patient Education  2024 [employer]. Understanding Your Risk for Falls Millions of people have serious injuries from falls each year. It is important to understand your risk of falling. Talk with your health care provider about your risk and what you can do to lower it. If you do have a serious fall, make sure to tell your provider. Falling once raises your risk of falling again. How can falls affect me? Serious injuries from falls are common. These include: Broken bones, such as hip fractures. Head injuries, such as traumatic brain injuries (TBI) or concussions. A fear of falling can cause you to avoid activities and stay at home. This can make your muscles weaker and raise your risk for a fall. What can increase my risk? There are a number of risk factors that increase your risk for falling. The more risk factors you have, the higher your risk of falling. Serious injuries from a fall happen most often to people who are older than 68 years old. Teenagers and young adults ages 93-29 are also at higher risk. Common risk factors include: Weakness in the lower body. Being generally  weak or confused due to long-term (chronic) illness. Dizziness or balance problems. Poor vision. Medicines that cause dizziness or drowsiness. These may include: Medicines for your blood pressure, heart, anxiety, insomnia, or swelling (edema). Pain medicines. Muscle relaxants. Other risk factors include: Drinking alcohol. Having had a fall in the past. Having foot pain or wearing improper footwear. Working at a dangerous job. Having any of the following in your home: Tripping hazards, such as floor clutter or loose rugs. Poor  lighting. Pets. Having dementia or memory loss. What actions can I take to lower my risk of falling?     Physical activity Stay physically fit. Do strength and balance exercises. Consider taking a regular class to build strength and balance. Yoga and tai chi are good options. Vision Have your eyes checked every year and your prescription for glasses or contacts updated as needed. Shoes and walking aids Wear non-skid shoes. Wear shoes that have rubber soles and low heels. Do not wear high heels. Do not walk around the house in socks or slippers. Use a cane or walker as told by your provider. Home safety Attach secure railings on both sides of your stairs. Install grab bars for your bathtub, shower, and toilet. Use a non-skid mat in your bathtub or shower. Attach bath mats securely with double-sided, non-slip rug tape. Use good lighting in all rooms. Keep a flashlight near your bed. Make sure there is a clear path from your bed to the bathroom. Use night-lights. Do not use throw rugs. Make sure all carpeting is taped or tacked down securely. Remove all clutter from walkways and stairways, including extension cords. Repair uneven or broken steps and floors. Avoid walking on icy or slippery surfaces. Walk on the grass instead of on icy or slick sidewalks. Use ice melter to get rid of ice on walkways in the winter. Use a cordless phone. Questions to ask your health care provider Can you help me check my risk for a fall? Do any of my medicines make me more likely to fall? Should I take a vitamin D supplement? What exercises can I do to improve my strength and balance? Should I make an appointment to have my vision checked? Do I need a bone density test to check for weak bones (osteoporosis)? Would it help to use a cane or a walker? Where to find more information Centers for Disease Control and Prevention, STEADI: TonerPromos.no Community-Based Fall Prevention Programs: TonerPromos.no [employer] on Aging: BaseRingTones.pl Contact a health care provider if: You fall at home. You are afraid of falling at home. You feel weak, drowsy, or dizzy. This information is not intended to replace advice given to you by your health care provider. Make sure you discuss any questions you have with your health care provider. Document Revised: 10/19/2021 Document Reviewed: 10/19/2021 Elsevier Patient Education  2024 [employer].

## 2022-11-29 NOTE — Progress Notes (Signed)
History of Present Illness: 67 year old male comes in today for Testopel implant.  He is a patient of Dr. Bjorn Pippin.  Last implant was in January of this year.  Past Medical History:  Diagnosis Date   Acid reflux    Allergy February   Anxiety    2003   Ascending aortic aneurysm (HCC)    GERD (gastroesophageal reflux disease)    Low testosterone    PVCs (premature ventricular contractions)     Past Surgical History:  Procedure Laterality Date   CHOLECYSTECTOMY     COLONOSCOPY WITH PROPOFOL N/A 10/01/2022   Procedure: COLONOSCOPY WITH PROPOFOL;  Surgeon: Dolores Frame, MD;  [facility]: [facility];  Service: Gastroenterology;  Laterality: N/A;  8:15AM;ASA 1   ESOPHAGOGASTRODUODENOSCOPY N/A 09/21/2017   Procedure: ESOPHAGOGASTRODUODENOSCOPY (EGD);  Surgeon: Malissa Hippo, MD;  Location: [facility];  Service: Endoscopy;  Laterality: N/A;  2:55   POLYPECTOMY  10/01/2022   Procedure: POLYPECTOMY INTESTINAL;  Surgeon: Dolores Frame, MD;  Location: [facility];  Service: Gastroenterology;;   TIF procedure      Home Medications:  Allergies as of 11/30/2022       Reactions   Lidocaine-epinephrine Shortness Of Breath, Anxiety, Palpitations        Medication List        Accurate as of November 29, 2022 12:59 PM. If you have any questions, ask your nurse or doctor.          amLODipine 10 MG tablet Commonly known as: NORVASC TAKE 1 TABLET(10 MG) BY MOUTH DAILY   azelastine 0.1 % nasal spray Commonly known as: ASTELIN Place 1 spray into both nostrils 2 (two) times daily.   hydrocortisone 2.5 % rectal cream Commonly known as: Anusol-HC Place 1 application rectally 2 (two) times daily.   loratadine 10 MG tablet Commonly known as: CLARITIN Take 10 mg by mouth daily.   OVER THE COUNTER MEDICATION AG1 - one daily   pantoprazole 40 MG tablet Commonly known as: PROTONIX TAKE 1 TABLET BY MOUTH EVERY DAY AS NEEDED   Testosterone 20 %  Crea Apply 3 clicks topically qam.   valACYclovir 1000 MG tablet Commonly known as: VALTREX Take 1,000 mg by mouth 2 (two) times daily.   Vitamin D-3 125 MCG (5000 UT) Tabs Take 10,000 Units by mouth daily at 12 noon.        Allergies:  Allergies  Allergen Reactions   Lidocaine-Epinephrine Shortness Of Breath, Anxiety and Palpitations    Family History  Problem Relation Age of Onset   Alcohol abuse Mother    Cancer Mother        lung   COPD Mother    Heart disease Mother    Early death Mother        age 13 multiple factors   Cancer Sister        breast    Social History:  reports that he has never smoked. He has quit using smokeless tobacco.  His smokeless tobacco use included snuff. He reports current alcohol use of about 2.0 standard drinks of alcohol per week. He reports that he does not use drugs.  ROS: A complete review of systems was performed.  All systems are negative except for pertinent findings as noted.  Physical Exam:  Vital signs in last 24 hours: There were no vitals taken for this visit. Constitutional:  Alert and oriented, No acute distress Cardiovascular: Regular rate  Respiratory: Normal respiratory effort GI: Abdomen is soft, nontender, nondistended,  no abdominal masses. No CVAT.  Genitourinary: Normal male phallus, testes are descended bilaterally and non-tender and without masses, scrotum is normal in appearance without lesions or masses, perineum is normal on inspection. Lymphatic: No lymphadenopathy Neurologic: Grossly intact, no focal deficits Psychiatric: Normal mood and affect  I have reviewed prior pt notes  I have reviewed prior PSA results   Procedure note:  With the patient in the right decubitus position, left upper lateral buttock was prepped and infiltrated with 1% lidocaine with epinephrine.  Small stab wound created.  I then inserted the trocar.  6 pellets were then placed.  The trocar was removed.  Wound cleansed,  Steri-Stripped and compressive gauze dressing placed.  The patient tolerated the procedure well. Impression/Assessment:  Low testosterone, on repletion with Testopel pellets, 6 every 6 months.  He has been on cream the past 2 to 3 weeks, bridging until this time for his delayed pellet implant  Plan:  Labs in 3 months  Office visit with Dr. Ma Rings in 6 months for pellets

## 2022-11-29 NOTE — Progress Notes (Signed)
 Because this visit was a virtual/telehealth visit,  certain criteria was not obtained, such a blood pressure, CBG if applicable, and timed get up and go. Any medications not marked as "taking" were not mentioned during the medication reconciliation part of the visit. Any vitals not documented were not able to be obtained due to this being a telehealth visit or patient was unable to self-report a recent blood pressure reading due to a lack of equipment at home via telehealth. Vitals that have been documented are verbally provided by the patient.   Subjective:   Tim Rogers is a 67 y.o. male who presents for Medicare Annual/Subsequent preventive examination.  Visit Complete: Virtual  I connected with  Madelin Headings on 11/29/22 by a audio enabled telemedicine application and verified that I am speaking with the correct person using two identifiers.  Patient Location: Home  Provider Location: Home Office  I discussed the limitations of evaluation and management by telemedicine. The patient expressed understanding and agreed to proceed.  Patient Medicare AWV questionnaire was completed by the patient on 11/23/2022; I have confirmed that all information answered by patient is correct and no changes since this date.  Cardiac Risk Factors include: advanced age (>47men, >44 women);hypertension;male gender     Objective:    Today's Vitals   11/29/22 0858  Weight: 180 lb (81.6 kg)  Height: 5\' 11"  (1.803 m)   Body mass index is 25.1 kg/m.     11/29/2022    9:01 AM 10/19/2021    8:23 AM 08/09/2021    7:19 AM 08/21/2020    8:46 AM 08/20/2019    8:04 AM 09/21/2017    1:26 PM  Advanced Directives  Does Patient Have a Medical Advance Directive? No No No No;Yes Yes Yes  Type of Advance Directive    Living will Living will Living will  Does patient want to make changes to medical advance directive?    No - Patient declined No - Patient declined   Copy of Healthcare Power of Attorney in  Chart?      No - copy requested  Would patient like information on creating a medical advance directive? No - Patient declined Yes (ED - Information included in AVS) No - Patient declined No - Patient declined      Current Medications (verified) Outpatient Encounter Medications as of 11/29/2022  Medication Sig   amLODipine (NORVASC) 10 MG tablet TAKE 1 TABLET(10 MG) BY MOUTH DAILY   azelastine (ASTELIN) 0.1 % nasal spray Place 1 spray into both nostrils 2 (two) times daily.   Cholecalciferol (VITAMIN D-3) 125 MCG (5000 UT) TABS Take 10,000 Units by mouth daily at 12 noon.   hydrocortisone (ANUSOL-HC) 2.5 % rectal cream Place 1 application rectally 2 (two) times daily.   loratadine (CLARITIN) 10 MG tablet Take 10 mg by mouth daily.   OVER THE COUNTER MEDICATION AG1 - one daily   pantoprazole (PROTONIX) 40 MG tablet TAKE 1 TABLET BY MOUTH EVERY DAY AS NEEDED   Testosterone 20 % CREA Apply 3 clicks topically qam.   valACYclovir (VALTREX) 1000 MG tablet Take 1,000 mg by mouth 2 (two) times daily.   No facility-administered encounter medications on file as of 11/29/2022.    Allergies (verified) Lidocaine-epinephrine   History: Past Medical History:  Diagnosis Date   Acid reflux    Allergy February   Anxiety    2003   Ascending aortic aneurysm (HCC)    GERD (gastroesophageal reflux disease)    Low testosterone  PVCs (premature ventricular contractions)    Past Surgical History:  Procedure Laterality Date   CHOLECYSTECTOMY     COLONOSCOPY WITH PROPOFOL N/A 10/01/2022   Procedure: COLONOSCOPY WITH PROPOFOL;  Surgeon: Dolores Frame, MD;  [facility];  Service: Gastroenterology;  Laterality: N/A;  8:15AM;ASA 1   ESOPHAGOGASTRODUODENOSCOPY N/A 09/21/2017   Procedure: ESOPHAGOGASTRODUODENOSCOPY (EGD);  Surgeon: Malissa Hippo, MD;  [facility];  Service: Endoscopy;  Laterality: N/A;  2:55   POLYPECTOMY  10/01/2022   Procedure: POLYPECTOMY  INTESTINAL;  Surgeon: Dolores Frame, MD;  [facility];  Service: Gastroenterology;;   TIF procedure     Family History  Problem Relation Age of Onset   Alcohol abuse Mother    Cancer Mother        lung   COPD Mother    Heart disease Mother    Early death Mother        age 26 multiple factors   Cancer Sister        breast   Social History   Socioeconomic History   Marital status: Single    Spouse name: Not on file   Number of children: 0   Years of education: 66   Highest education level: Master's degree (e.g., MA, MS, MEng, MEd, MSW, MBA)  Occupational History   Occupation: retired  Tobacco Use   Smoking status: Never   Smokeless tobacco: Former    Types: Snuff  Vaping Use   Vaping status: Never Used  Substance and Sexual Activity   Alcohol use: Yes    Alcohol/week: 2.0 standard drinks of alcohol    Types: 2 Cans of beer per week    Comment: couple of beer sometimes daily   Drug use: No   Sexual activity: Not Currently    Birth control/protection: None  Other Topics Concern   Not on file  Social History Narrative   Retired.Single.Lives alone.   Ex-[employer].   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   Social Determinants of Health   Financial Resource Strain: Low Risk  (11/23/2022)   Overall Financial Resource Strain (CARDIA)    Difficulty of Paying Living Expenses: Not hard at all  Food Insecurity: No Food Insecurity (11/23/2022)   Hunger Vital Sign    Worried About Running Out of Food in the Last Year: Never true    Ran Out of Food in the Last Year: Never true  Transportation Needs: No Transportation Needs (11/23/2022)   PRAPARE - Administrator, Civil Service (Medical): No    Lack of Transportation (Non-Medical): No  Physical Activity: Sufficiently Active (11/23/2022)   Exercise Vital Sign    Days of Exercise per Week: 5 days    Minutes of  Exercise per Session: 90 min  Stress: No Stress Concern Present (11/23/2022)   [employer] of Occupational Health - Occupational Stress Questionnaire    Feeling of Stress : Not at all  Social Connections: Moderately Integrated (11/23/2022)   Social Connection and Isolation Panel [NHANES]    Frequency of Communication with Friends and Family: More than three times a week    Frequency of Social Gatherings with Friends and Family: More than three times a week    Attends Religious Services: More than 4 times per year    Active Member of [employer] or Organizations: Yes    Attends Banker  Meetings: More than 4 times per year    Marital Status: Never married    Tobacco Counseling Counseling given: Yes   Clinical Intake:  Pre-visit preparation completed: Yes  Pain : No/denies pain     Nutritional Risks: None Diabetes: No  How often do you need to have someone help you when you read instructions, pamphlets, or other written materials from your doctor or pharmacy?: 1 - Never  Interpreter Needed?: No  Information entered by ::  Averleigh Savary, CMA   Activities of Daily Living    11/23/2022    4:06 PM  In your present state of health, do you have any difficulty performing the following activities:  Hearing? 0  Vision? 0  Difficulty concentrating or making decisions? 0  Walking or climbing stairs? 0  Dressing or bathing? 0  Doing errands, shopping? 0  Preparing Food and eating ? N  Using the Toilet? N  In the past six months, have you accidently leaked urine? N  Do you have problems with loss of bowel control? N  Managing your Medications? N  Managing your Finances? N  Housekeeping or managing your Housekeeping? N    Patient Care Team: Billie Lade, MD as PCP - General (Internal Medicine) Jonelle Sidle, MD as PCP - Cardiology (Cardiology)  Indicate any recent Medical Services you may have received from other than [facility] providers in the past year (date  may be approximate).     Assessment:   This is a routine wellness examination for Tim Rogers.  Hearing/Vision screen Hearing Screening - Comments:: Patient denies any hearing difficulties.   Vision Screening - Comments:: Wears rx glasses - up to date with routine eye exams with [facility]   Goals Addressed             This Visit's Progress    Patient Stated   On track    Keep brain active and healthy  Stay as active as possible        Depression Screen    11/29/2022    9:02 AM 10/15/2022    8:01 AM 10/09/2021    9:06 AM 05/15/2021   10:07 AM 08/21/2020    8:32 AM 08/20/2019    8:09 AM 05/16/2019    8:47 AM  PHQ 2/9 Scores  PHQ - 2 Score 0 0 0 0 0 0 0  PHQ- 9 Score     0    Exception Documentation       Medical reason    Fall Risk    11/23/2022    4:06 PM 10/15/2022    8:01 AM 10/09/2021    9:06 AM 05/15/2021   10:07 AM 08/21/2020    8:33 AM  Fall Risk   Falls in the past year? 0 0 0 0 0  Number falls in past yr: 0 0 0 0   Injury with Fall? 0 0 0 0   Risk for fall due to : No Fall Risks  No Fall Risks No Fall Risks   Follow up Falls prevention discussed  Falls evaluation completed Falls evaluation completed     MEDICARE RISK AT HOME: Medicare Risk at Home Any stairs in or around the home?: Yes If so, are there any without handrails?: Yes Home free of loose throw rugs in walkways, pet beds, electrical cords, etc?: No Adequate lighting in your home to reduce risk of falls?: Yes Life alert?: No Use of a cane, walker or w/c?: No Grab bars in the bathroom?: No  Shower chair or bench in shower?: No Elevated toilet seat or a handicapped toilet?: Yes  TIMED UP AND GO:  Was the test performed?  No    Cognitive Function:        11/29/2022    9:02 AM 10/19/2021    8:27 AM 08/21/2020    8:48 AM 08/20/2019    8:13 AM  6CIT Screen  What Year? 0 points 0 points 0 points 0 points  What month? 0 points 0 points 0 points 0 points  What time? 0 points 0 points 0 points 0  points  Count back from 20 0 points 0 points 0 points 0 points  Months in reverse 0 points 0 points 0 points 0 points  Repeat phrase 0 points 0 points 2 points 2 points  Total Score 0 points 0 points 2 points 2 points    Immunizations Immunization History  Administered Date(s) Administered   Fluad Quad(high Dose 65+) 11/12/2019   Influenza Inj Mdck Quad Pf 12/12/2017   Influenza,inj,Quad PF,6+ Mos 12/04/2016, 10/07/2018   Moderna Sars-Covid-2 Vaccination 04/05/2019, 05/04/2019, 12/28/2019   Pneumococcal Conjugate-13 11/19/2014   Pneumococcal Polysaccharide-23 12/04/2016   Tdap 08/20/2019   Zoster Recombinant(Shingrix) 12/04/2016    TDAP status: Up to date  Flu Vaccine status: Due, Education has been provided regarding the importance of this vaccine. Advised may receive this vaccine at local pharmacy or Health Dept. Aware to provide a copy of the vaccination record if obtained from local pharmacy or Health Dept. Verbalized acceptance and understanding.  Pneumococcal vaccine status: Due, Education has been provided regarding the importance of this vaccine. Advised may receive this vaccine at local pharmacy or Health Dept. Aware to provide a copy of the vaccination record if obtained from local pharmacy or Health Dept. Verbalized acceptance and understanding.  Covid-19 vaccine status: Information provided on how to obtain vaccines.   Qualifies for Shingles Vaccine? Yes   Zostavax completed Yes   Shingrix Completed?: No.    Education has been provided regarding the importance of this vaccine. Patient has been advised to call insurance company to determine out of pocket expense if they have not yet received this vaccine. Advised may also receive vaccine at local pharmacy or Health Dept. Verbalized acceptance and understanding.  Screening Tests Health Maintenance  Topic Date Due   Zoster Vaccines- Shingrix (2 of 2) 01/29/2017   Pneumonia Vaccine 58+ Years old (3 of 3 - PPSV23 or PCV20)  12/04/2021   INFLUENZA VACCINE  09/30/2022   Medicare Annual Wellness (AWV)  10/20/2022   COVID-19 Vaccine (4 - 2023-24 season) 10/31/2022   Colonoscopy  09/30/2025   DTaP/Tdap/Td (2 - Td or Tdap) 08/19/2029   Hepatitis C Screening  Completed   HPV VACCINES  Aged Out    Health Maintenance  Health Maintenance Due  Topic Date Due   Zoster Vaccines- Shingrix (2 of 2) 01/29/2017   Pneumonia Vaccine 72+ Years old (3 of 3 - PPSV23 or PCV20) 12/04/2021   INFLUENZA VACCINE  09/30/2022   Medicare Annual Wellness (AWV)  10/20/2022   COVID-19 Vaccine (4 - 2023-24 season) 10/31/2022    Colorectal cancer screening: Type of screening: Colonoscopy. Completed 10/01/2022. Repeat every 3 years  Lung Cancer Screening: (Low Dose CT Chest recommended if Age 56-80 years, 20 pack-year currently smoking OR have quit w/in 15years.) does not qualify.    Additional Screening:  Hepatitis C Screening: does not qualify; Completed 11/15/2018  Vision Screening: Recommended annual ophthalmology exams for early detection of glaucoma and other  disorders of the eye. Is the patient up to date with their annual eye exam?  Yes  Who is the provider or what is the name of the office in which the patient attends annual eye exams? [facility] If pt is not established with a provider, would they like to be referred to a provider to establish care? No .   Dental Screening: Recommended annual dental exams for proper oral hygiene  Diabetic Foot Exam: n/a  Community Resource Referral / Chronic Care Management: CRR required this visit?  No   CCM required this visit?  No     Plan:     I have personally reviewed and noted the following in the patient's chart:   Medical and social history Use of alcohol, tobacco or illicit drugs  Current medications and supplements including opioid prescriptions. Patient is not currently taking opioid prescriptions. Functional ability and status Nutritional  status Physical activity Advanced directives List of other physicians Hospitalizations, surgeries, and ER visits in previous 12 months Vitals Screenings to include cognitive, depression, and falls Referrals and appointments  In addition, I have reviewed and discussed with patient certain preventive protocols, quality metrics, and best practice recommendations. A written personalized care plan for preventive services as well as general preventive health recommendations were provided to patient.     Jordan Hawks Keandrea Tapley, CMA   11/29/2022   After Visit Summary: (MyChart) Due to this being a telephonic visit, the after visit summary with patients personalized plan was offered to patient via MyChart

## 2022-11-30 ENCOUNTER — Ambulatory Visit (INDEPENDENT_AMBULATORY_CARE_PROVIDER_SITE_OTHER): Payer: Medicare Other | Admitting: Urology

## 2022-11-30 ENCOUNTER — Encounter: Payer: Self-pay | Admitting: Urology

## 2022-11-30 VITALS — BP 106/73 | HR 96

## 2022-11-30 DIAGNOSIS — E291 Testicular hypofunction: Secondary | ICD-10-CM

## 2022-11-30 MED ORDER — TESTOSTERONE 75 MG IL PLLT
450.0000 mg | PELLET | Freq: Once | Status: AC
Start: 2022-11-30 — End: 2022-11-30
  Administered 2022-11-30: 450 mg

## 2022-11-30 MED ORDER — TESTOSTERONE 75 MG IL PLLT
75.0000 mg | PELLET | Freq: Once | Status: DC
Start: 2022-11-30 — End: 2022-11-30

## 2022-11-30 NOTE — Addendum Note (Signed)
Addended by: Christoper Fabian R on: 11/30/2022 04:01 PM   Modules accepted: Orders

## 2022-12-13 ENCOUNTER — Ambulatory Visit
Admission: EM | Admit: 2022-12-13 | Discharge: 2022-12-13 | Disposition: A | Discharge status: Home / Self Care | Payer: Medicare Other | Attending: Family Medicine | Admitting: Family Medicine

## 2022-12-13 DIAGNOSIS — R112 Nausea with vomiting, unspecified: Secondary | ICD-10-CM

## 2022-12-13 DIAGNOSIS — R197 Diarrhea, unspecified: Secondary | ICD-10-CM | POA: Diagnosis not present

## 2022-12-13 MED ORDER — ONDANSETRON 4 MG PO TBDP
4.0000 mg | ORAL_TABLET | Freq: Once | ORAL | Status: AC
  Administered 2022-12-13: 4 mg via ORAL

## 2022-12-13 MED ORDER — ONDANSETRON 4 MG PO TBDP: 4.0000 mg | ORAL_TABLET | Freq: Three times a day (TID) | ORAL | 0 refills | Status: DC | PRN

## 2022-12-13 MED ORDER — LOPERAMIDE HCL 2 MG PO CAPS: 2.0000 mg | ORAL_CAPSULE | Freq: Four times a day (QID) | ORAL | 0 refills | Status: DC | PRN

## 2022-12-13 NOTE — ED Provider Notes (Signed)
[facility]    CSN: [id number] Arrival date & time: 12/13/22  4098      History   Chief Complaint Chief Complaint  Patient presents with   Emesis   Diarrhea    HPI Tim Rogers is a 67 y.o. male.   Presenting today with new onset nausea vomiting and diarrhea that started this morning upon waking.  States he was not tolerating anything by mouth prior to arrival this morning.  Initially had some lower abdominal pain and cramping but this has all but resolved at this time.  Denies fever, chills, body aches, cough, congestion, chest pain, shortness of breath.  No new medications or foods tried recently and no known history of pertinent chronic GI issues.  Does have GERD for which she takes PPIs daily.    Past Medical History:  Diagnosis Date   Acid reflux    Allergy February   Anxiety    2003   Ascending aortic aneurysm (HCC)    GERD (gastroesophageal reflux disease)    Low testosterone    PVCs (premature ventricular contractions)     Patient Active Problem List   Diagnosis Date Noted   Screening for thyroid disorder 10/09/2021   Special screening for malignant neoplasms, colon 10/09/2021   Coronary artery calcification 05/15/2021   Allergies 05/15/2021   Hemorrhoids 11/20/2020   Anal burning 11/20/2020   Anal itching 11/20/2020   Gastroesophageal reflux disease without esophagitis 08/15/2017   Positive PPD 10/08/2016   Polycythemia 10/08/2016   Hypogonadism in male 10/01/2016   Environmental allergies 10/01/2016   Chronic GERD 10/01/2016   Thoracic ascending aortic aneurysm (HCC) 10/01/2016   Recurrent herpes simplex virus (HSV) infection of buttock 10/01/2016    Past Surgical History:  Procedure Laterality Date   CHOLECYSTECTOMY     COLONOSCOPY WITH PROPOFOL N/A 10/01/2022   Procedure: COLONOSCOPY WITH PROPOFOL;  Surgeon: Dolores Frame, MD;  [facility];  Service: Gastroenterology;  Laterality: N/A;  8:15AM;ASA 1    ESOPHAGOGASTRODUODENOSCOPY N/A 09/21/2017   Procedure: ESOPHAGOGASTRODUODENOSCOPY (EGD);  Surgeon: Malissa Hippo, MD;  [facility];  Service: Endoscopy;  Laterality: N/A;  2:55   POLYPECTOMY  10/01/2022   Procedure: POLYPECTOMY INTESTINAL;  Surgeon: Dolores Frame, MD;  [facility];  Service: Gastroenterology;;   TIF procedure         Home Medications    Prior to Admission medications   Medication Sig Start Date End Date Taking? Authorizing Provider  amLODipine (NORVASC) 10 MG tablet TAKE 1 TABLET(10 MG) BY MOUTH DAILY 10/18/22  Yes Jonelle Sidle, MD  Cholecalciferol (VITAMIN D-3) 125 MCG (5000 UT) TABS Take 10,000 Units by mouth daily at 12 noon.   Yes [provider]  loperamide (IMODIUM) 2 MG capsule Take 1 capsule (2 mg total) by mouth 4 (four) times daily as needed for diarrhea or loose stools. 12/13/22  Yes Particia Nearing, PA-C  ondansetron (ZOFRAN-ODT) 4 MG disintegrating tablet Take 1 tablet (4 mg total) by mouth every 8 (eight) hours as needed for nausea or vomiting. 12/13/22  Yes Particia Nearing, PA-C  pantoprazole (PROTONIX) 40 MG tablet TAKE 1 TABLET BY MOUTH EVERY DAY AS NEEDED 10/15/22  Yes Billie Lade, MD  azelastine (ASTELIN) 0.1 % nasal spray Place 1 spray into both nostrils 2 (two) times daily. 08/06/21   Particia Nearing, PA-C  hydrocortisone (ANUSOL-HC) 2.5 % rectal cream Place 1 application rectally 2 (two) times daily. 11/24/20   Carlan,  Chelsea L, NP  loratadine (CLARITIN) 10 MG tablet Take 10 mg by mouth daily.    [provider]  OVER THE COUNTER MEDICATION AG1 - one daily    [provider]  Testosterone 20 % CREA Apply 3 clicks topically qam. 11/11/22   Bjorn Pippin, MD  valACYclovir (VALTREX) 1000 MG tablet Take 1,000 mg by mouth 2 (two) times daily. 08/28/20   [provider]    Family History Family History  Problem Relation Age of Onset   Alcohol abuse Mother     Cancer Mother        lung   COPD Mother    Heart disease Mother    Early death Mother        age 72 multiple factors   Cancer Sister        breast    Social History Social History   Tobacco Use   Smoking status: Never   Smokeless tobacco: Former    Types: Snuff  Vaping Use   Vaping status: Never Used  Substance Use Topics   Alcohol use: Yes    Alcohol/week: 2.0 standard drinks of alcohol    Types: 2 Cans of beer per week    Comment: couple of beer sometimes daily   Drug use: No     Allergies   Lidocaine-epinephrine   Review of Systems Review of Systems Per HPI  Physical Exam Triage Vital Signs ED Triage Vitals  Encounter Vitals Group     BP 12/13/22 1011 (!) 93/59     Systolic BP Percentile --      Diastolic BP Percentile --      Pulse Rate 12/13/22 1011 85     Resp 12/13/22 1011 16     Temp 12/13/22 1011 98.2 F (36.8 C)     Temp Source 12/13/22 1011 Oral     SpO2 12/13/22 1011 95 %     Weight --      Height --      Head Circumference --      Peak Flow --      Pain Score 12/13/22 1014 3     Pain Loc --      Pain Education --      Exclude from Growth Chart --    No data found.  Updated Vital Signs BP (!) 93/59 (BP Location: Right Arm)   Pulse 85   Temp 98.2 F (36.8 C) (Oral)   Resp 16   SpO2 95%   Visual Acuity Right Eye Distance:   Left Eye Distance:   Bilateral Distance:    Right Eye Near:   Left Eye Near:    Bilateral Near:     Physical Exam Vitals and nursing note reviewed.  Constitutional:      Appearance: Normal appearance.  HENT:     Head: Atraumatic.     Mouth/Throat:     Mouth: Mucous membranes are moist.  Eyes:     Extraocular Movements: Extraocular movements intact.     Conjunctiva/sclera: Conjunctivae normal.  Cardiovascular:     Rate and Rhythm: Normal rate and regular rhythm.  Pulmonary:     Effort: Pulmonary effort is normal.     Breath sounds: Normal breath sounds.  Abdominal:     General: Bowel sounds  are normal. There is no distension.     Palpations: Abdomen is soft.     Tenderness: There is no abdominal tenderness. There is no right CVA tenderness, left CVA tenderness or guarding.  Musculoskeletal:  General: Normal range of motion.     Cervical back: Normal range of motion and neck supple.  Skin:    General: Skin is warm and dry.  Neurological:     General: No focal deficit present.     Mental Status: He is oriented to person, place, and time.  Psychiatric:        Mood and Affect: Mood normal.        Thought Content: Thought content normal.        Judgment: Judgment normal.      UC Treatments / Results  Labs (all labs ordered are listed, but only abnormal results are displayed) Labs Reviewed - No data to display  EKG   Radiology No results found.  Procedures Procedures (including critical care time)  Medications Ordered in UC Medications  ondansetron (ZOFRAN-ODT) disintegrating tablet 4 mg (4 mg Oral Given 12/13/22 1021)    Initial Impression / Assessment and Plan / UC Course  I have reviewed the triage vital signs and the nursing notes.  Pertinent labs & imaging results that were available during my care of the patient were reviewed by me and considered in my medical decision making (see chart for details).     Mildly hypotensive in triage, otherwise vital signs within normal limits.  He is alert, oriented with no lightheadedness or syncopal episodes.  Significant improvement after dose of Zofran in triage.  Has tolerated an entire glass of ice water since arrival with no vomiting.  Suspect viral GI illness.  Treat with Zofran, Imodium, brat diet, fluids and return precautions reviewed.  Final Clinical Impressions(s) / UC Diagnoses   Final diagnoses:  Nausea vomiting and diarrhea   Discharge Instructions   None    ED Prescriptions     Medication Sig Dispense Auth. Provider   ondansetron (ZOFRAN-ODT) 4 MG disintegrating tablet Take 1 tablet (4 mg  total) by mouth every 8 (eight) hours as needed for nausea or vomiting. 20 tablet Particia Nearing, [place]   loperamide (IMODIUM) 2 MG capsule Take 1 capsule (2 mg total) by mouth 4 (four) times daily as needed for diarrhea or loose stools. 12 capsule Particia Nearing, [place]      PDMP not reviewed this encounter.   Particia Nearing, [place] 12/13/22 1203

## 2022-12-13 NOTE — ED Triage Notes (Signed)
Pt presents with nausea, vomiting and diarrhea that started this morning. Pt states he was having pain in his stomach that has resolved now, just feels tender to the touch. Took Pepto bismol with little relief.

## 2023-01-06 ENCOUNTER — Ambulatory Visit (INDEPENDENT_AMBULATORY_CARE_PROVIDER_SITE_OTHER): Payer: Medicare Other | Admitting: Gastroenterology

## 2023-01-06 ENCOUNTER — Encounter (INDEPENDENT_AMBULATORY_CARE_PROVIDER_SITE_OTHER): Payer: Self-pay | Admitting: Gastroenterology

## 2023-01-06 VITALS — BP 134/87 | HR 80 | Temp 98.4°F | Ht 71.0 in | Wt 182.0 lb

## 2023-01-06 DIAGNOSIS — K219 Gastro-esophageal reflux disease without esophagitis: Secondary | ICD-10-CM | POA: Diagnosis not present

## 2023-01-06 MED ORDER — HYDROCORTISONE (PERIANAL) 2.5 % EX CREA: 1.0000 | TOPICAL_CREAM | Freq: Two times a day (BID) | CUTANEOUS | 2 refills | Status: AC

## 2023-01-06 NOTE — Patient Instructions (Signed)
BP is slightly low today but given you are asymptomatic and have been on antihistamines, this is not concerning. Please continue to stay well hydrated with plenty of fluids especially while taking cold/allergy medications and follow up with PCP if cough persists past 14 days.  Continue protonix 40mg  daily Be mindful of greasy, spicy, fried, citrus foods,  caffeine, carbonated drinks, chocolate and alcohol can increase reflux symptoms Stay upright 2-3 hours after eating, prior to lying down and avoid eating late in the evenings.  I have sent a refill of your hemorrhoid cream to use as needed  Follow up 1 year  It was a pleasure to see you today. I want to create trusting relationships with patients and provide genuine, compassionate, and quality care. I truly value your feedback! please be on the lookout for a survey regarding your visit with me today. I appreciate your input about our visit and your time in completing this!    Cidney Kirkwood L. Jeanmarie Hubert, MSN, APRN, AGNP-C Adult-Gerontology Nurse Practitioner [facility]

## 2023-01-06 NOTE — Progress Notes (Addendum)
Referring Provider: Billie Lade, MD Primary Care Physician:  Billie Lade, MD Primary GI Physician: Dr. Levon Hedger   Chief Complaint  Patient presents with   Gastroesophageal Reflux    Follow up on GERD. Takes protonix and states doing well.    HPI:   Tim Rogers is a 67 y.o. male with past medical history of  acid reflux, anxiety, ascending aortic aneurysm, low testosterone and PVCs.    Patient presenting today for follow up of GERD  Last seen virtually November 2023, at that time GERD well-managed on Protonix.  Reported he has decreased alcohol intake to only a couple times per month.  Felt that Protonix may be making his stomach feel somewhat unsettled.  Reported history of TIF procedure in the past that failed.  Using chiropractic adjustments that help with his GERD.  Patient recommended to decrease Protonix to 20 mg daily, good reflux precautions, colorectal cancer screening via colonoscopy due in July 2024  Present: Notes he is concerned about BP being slightly low today though denies any lightheadedness or dizziness. States he is taking some antihistamines for allergy/respiratory symptoms.   Having a BM daily without issue. No rectal bleeding or melena.  GERD is well managed with protonix. States he cut down to 20mg  or protonix but did not keep symptoms well controlled so he went back to 40mg  daily and feels this is working well for him. No dysphagia or odynophagia, would like to continue to follow with [place] for refills of this vs. His PCP.   Requesting refill of hemorrhoid cream which he uses only very occasionally.   He is going to the gym 3 days a week and playing golf 2 days a week. Trying to stay active.   Last Colonoscopy:09/2022 - Three 2 to 6 mm polyps in the ascending colon and                            in the cecum, removed with a cold snare. Resected                            and retrieved.                           - One 1 mm polyp in the ascending  colon, removed                            with a jumbo cold forceps. Resected and retrieved.                           - One 4 mm polyp in the transverse colon, removed                            with a cold snare. Resected and retrieved.                           - Three 3 to 5 mm polyps in the rectum and in the                            descending colon, removed with a cold snare.  Resected and retrieved.                           - Non-bleeding internal hemorrhoids. 5 polyps were tubular adenomas and 3 were hyperplastic polyps   Last Endoscopy:(2019) normal esophagus, z line irregular 39 cm from incisors, 2cm HH, suture at fundus from previous surgery, normal duodenal bulb and second portion of duodenum, no specimens collected.  Repeat Colonoscopy 3 years  Past Medical History:  Diagnosis Date   Acid reflux    Allergy February   Anxiety    2003   Ascending aortic aneurysm (HCC)    GERD (gastroesophageal reflux disease)    Low testosterone    PVCs (premature ventricular contractions)     Past Surgical History:  Procedure Laterality Date   CHOLECYSTECTOMY     COLONOSCOPY WITH PROPOFOL N/A 10/01/2022   Procedure: COLONOSCOPY WITH PROPOFOL;  Surgeon: Dolores Frame, MD;  [facility];  Service: Gastroenterology;  Laterality: N/A;  8:15AM;ASA 1   ESOPHAGOGASTRODUODENOSCOPY N/A 09/21/2017   Procedure: ESOPHAGOGASTRODUODENOSCOPY (EGD);  Surgeon: Malissa Hippo, MD;  Location: [facility];  Service: Endoscopy;  Laterality: N/A;  2:55   POLYPECTOMY  10/01/2022   Procedure: POLYPECTOMY INTESTINAL;  Surgeon: Marguerita Merles, Reuel Boom, MD;  Location: [facility];  Service: Gastroenterology;;   TIF procedure      Current Outpatient Medications  Medication Sig Dispense Refill   amLODipine (NORVASC) 10 MG tablet TAKE 1 TABLET(10 MG) BY MOUTH DAILY 90 tablet 1   azelastine (ASTELIN) 0.1 % nasal spray Place 1 spray into both nostrils 2  (two) times daily. 30 mL 3   Cholecalciferol (VITAMIN D-3) 125 MCG (5000 UT) TABS Take 10,000 Units by mouth daily at 12 noon.     hydrocortisone (ANUSOL-HC) 2.5 % rectal cream Place 1 application rectally 2 (two) times daily. 28 g 2   loratadine (CLARITIN) 10 MG tablet Take 10 mg by mouth daily.     OVER THE COUNTER MEDICATION AG1 - one daily     pantoprazole (PROTONIX) 40 MG tablet TAKE 1 TABLET BY MOUTH EVERY DAY AS NEEDED 90 tablet 1   Testosterone 20 % CREA Apply 3 clicks topically qam. 100 g 2   valACYclovir (VALTREX) 1000 MG tablet Take 1,000 mg by mouth 2 (two) times daily.     No current facility-administered medications for this visit.    Allergies as of 01/06/2023 - Review Complete 01/06/2023  Allergen Reaction Noted   Lidocaine-epinephrine (pf) Shortness Of Breath, Anxiety, and Palpitations 03/18/2022    Family History  Problem Relation Age of Onset   Alcohol abuse Mother    Cancer Mother        lung   COPD Mother    Heart disease Mother    Early death Mother        age 72 multiple factors   Cancer Sister        breast    Social History   Socioeconomic History   Marital status: Single    Spouse name: Not on file   Number of children: 0   Years of education: 29   Highest education level: Master's degree (e.g., MA, MS, MEng, MEd, MSW, MBA)  Occupational History   Occupation: retired  Tobacco Use   Smoking status: Never   Smokeless tobacco: Former    Types: Snuff  Vaping Use   Vaping status: Never Used  Substance and Sexual Activity   Alcohol use: Yes  Alcohol/week: 2.0 standard drinks of alcohol    Types: 2 Cans of beer per week    Comment: couple of beer sometimes daily   Drug use: No   Sexual activity: Not Currently    Birth control/protection: None  Other Topics Concern   Not on file  Social History Narrative   Retired.Single.Lives alone.   Ex-[employer].   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   Social Determinants of Health   Financial Resource Strain: Low Risk  (11/23/2022)   Overall Financial Resource Strain (CARDIA)    Difficulty of Paying Living Expenses: Not hard at all  Food Insecurity: No Food Insecurity (11/23/2022)   Hunger Vital Sign    Worried About Running Out of Food in the Last Year: Never true    Ran Out of Food in the Last Year: Never true  Transportation Needs: No Transportation Needs (11/23/2022)   PRAPARE - Administrator, Civil Service (Medical): No    Lack of Transportation (Non-Medical): No  Physical Activity: Sufficiently Active (11/23/2022)   Exercise Vital Sign    Days of Exercise per Week: 5 days    Minutes of Exercise per Session: 90 min  Stress: No Stress Concern Present (11/23/2022)   [employer] of Occupational Health - Occupational Stress Questionnaire    Feeling of Stress : Not at all  Social Connections: Moderately Integrated (11/23/2022)   Social Connection and Isolation Panel [NHANES]    Frequency of Communication with Friends and Family: More than three times a week    Frequency of Social Gatherings with Friends and Family: More than three times a week    Attends Religious Services: More than 4 times per year    Active Member of [employer] or Organizations: Yes    Attends Engineer, structural: More than 4 times per year    Marital Status: Never married    Review of systems General: negative for malaise, night sweats, fever, chills, weight loss Neck: Negative for lumps, goiter, pain and significant neck swelling Resp: +cough  CV: Negative for chest pain, leg swelling, palpitations, orthopnea GI: denies melena, hematochezia, nausea, vomiting, diarrhea, constipation, dysphagia, odyonophagia, early satiety or unintentional weight loss.  MSK: Negative for joint pain or swelling, back pain, and muscle pain. The remainder of the review of systems is noncontributory.  Physical Exam: BP  95/62 (BP Location: Left Arm, Patient Position: Sitting, Cuff Size: Normal)   Pulse 83   Temp 98.4 F (36.9 C) (Oral)   Ht 5\' 11"  (1.803 m)   Wt 182 lb (82.6 kg)   BMI 25.38 kg/m  General:   Alert and oriented. No distress noted. Pleasant and cooperative.  Head:  Normocephalic and atraumatic. Eyes:  Conjuctiva clear without scleral icterus. Mouth:  Oral mucosa pink and moist. Good dentition. No lesions. Heart: Normal rate and rhythm, s1 and s2 heart sounds present.  Lungs: very minimal wheezing in bilateral lung bases  Abdomen:  +BS, soft, non-tender and non-distended. No rebound or guarding. No HSM or masses noted. Neurologic:  Alert and  oriented x4 Psych:  Alert and cooperative. Normal mood and affect.  Invalid input(s): "6 MONTHS"   ASSESSMENT: Tim Rogers is a 67 y.o. male presenting today for follow up of GERD  GERD well-managed on Protonix 40 mg once daily, did not tolerate stepping down to 20 mg daily in the past.  Will  continue with current PPI regimen and good reflux precautions.  He uses Anusol on occasion for hemorrhoid flareup and is requesting refill of this which I will send today.  BP mildly soft on initial check today though he has been treating some allergies/respiratory symptoms with antihistamines.  He denies any lightheadedness or dizziness.  He was given some water and blood pressure recheck thereafter was improved to his baseline.  Patient encouraged to continue to stay well-hydrated especially when using antihistamines, should follow-up with PCP if cough persist more than 14 days.   PLAN:  Continue with protonix 40mg  daily 2. Good reflux precautions  3. Refill of anusol 4. Stay well hydrated, follow with PCP regarding respiratory symptoms if not improving by 14 day mark  All questions were answered, patient verbalized understanding and is in agreement with plan as outlined above.   Follow Up: 1 year   Kerrin Markman L. Jeanmarie Hubert, MSN, APRN,  AGNP-C Adult-Gerontology Nurse Practitioner [facility]  I have reviewed the note and agree with the APP's assessment as described in this progress note  Katrinka Blazing, MD Gastroenterology and Hepatology [facility]

## 2023-01-14 ENCOUNTER — Other Ambulatory Visit: Payer: Self-pay

## 2023-01-14 DIAGNOSIS — I7121 Aneurysm of the ascending aorta, without rupture: Secondary | ICD-10-CM

## 2023-01-16 ENCOUNTER — Other Ambulatory Visit: Payer: Self-pay | Admitting: Internal Medicine

## 2023-01-16 DIAGNOSIS — K219 Gastro-esophageal reflux disease without esophagitis: Secondary | ICD-10-CM

## 2023-01-20 ENCOUNTER — Ambulatory Visit (HOSPITAL_COMMUNITY)
Admission: RE | Admit: 2023-01-20 | Discharge: 2023-01-20 | Disposition: A | Discharge status: Home / Self Care | Payer: Medicare Other | Source: Ambulatory Visit | Attending: Cardiology | Admitting: Cardiology

## 2023-01-20 DIAGNOSIS — I7121 Aneurysm of the ascending aorta, without rupture: Secondary | ICD-10-CM | POA: Diagnosis present

## 2023-01-20 MED ORDER — IOHEXOL 350 MG/ML SOLN
75.0000 mL | Freq: Once | INTRAVENOUS | Status: AC | PRN
  Administered 2023-01-20: 75 mL via INTRAVENOUS

## 2023-01-26 ENCOUNTER — Encounter: Payer: Self-pay | Admitting: Cardiology

## 2023-01-26 ENCOUNTER — Ambulatory Visit: Payer: Medicare Other | Attending: Cardiology | Admitting: Cardiology

## 2023-01-26 VITALS — BP 138/86 | HR 84 | Ht 71.0 in | Wt 183.0 lb

## 2023-01-26 DIAGNOSIS — I77819 Aortic ectasia, unspecified site: Secondary | ICD-10-CM | POA: Diagnosis present

## 2023-01-26 DIAGNOSIS — I10 Essential (primary) hypertension: Secondary | ICD-10-CM | POA: Diagnosis present

## 2023-01-26 DIAGNOSIS — I2510 Atherosclerotic heart disease of native coronary artery without angina pectoris: Secondary | ICD-10-CM | POA: Insufficient documentation

## 2023-01-26 NOTE — Progress Notes (Signed)
    Cardiology Office Note  Date: 01/26/2023   ID: Tim Rogers, DOB [birth year], MRN [id number]  History of Present Illness: Tim Rogers is a 67 y.o. male last seen in November 2023.  He is here for a follow-up visit.  States that he continues to exercise regularly, usually goes to the gym 3 days a week and plays golf twice a week.  No exertional chest pain, NYHA class I dyspnea, no palpitations or syncope.  States that he had did have a few episodes of hypotension related to dehydration following a stomach virus.  I reviewed his medications.  Current regimen includes Norvasc.  He checks blood pressure periodically at home with systolics generally under 120.  ECG today shows sinus rhythm with left anterior fascicular block and right bundle branch block.  He did have a recent chest CTA on November 21, study has not yet been formally read.  We discussed this today.  Physical Exam: VS:  BP 138/86   Pulse 84   Ht 5\' 11"  (1.803 m)   Wt 183 lb (83 kg)   SpO2 97%   BMI 25.52 kg/m , BMI Body mass index is 25.52 kg/m.  Wt Readings from Last 3 Encounters:  01/26/23 183 lb (83 kg)  01/06/23 182 lb (82.6 kg)  11/29/22 180 lb (81.6 kg)    General: Patient appears comfortable at rest. HEENT: Conjunctiva and lids normal. Neck: Supple, no elevated JVP or carotid bruits. Lungs: Clear to auscultation, nonlabored breathing at rest. Cardiac: Regular rate and rhythm, no S3 or significant systolic murmur, no pericardial rub.  ECG:  An ECG dated 01/05/2022 was personally reviewed today and demonstrated:  Sinus rhythm with low voltage.  Labwork: 10/15/2022: ALT 17; AST 23; BUN 17; Creatinine, Ser 1.18; Hemoglobin 16.7; Platelets 245; Potassium 4.2; Sodium 137; TSH 1.820     Component Value Date/Time   CHOL 162 10/15/2022 0844   TRIG 74 10/15/2022 0844   HDL 38 (L) 10/15/2022 0844   CHOLHDL 4.3 10/15/2022 0844   LDLCALC 110 (H) 10/15/2022 0844   Other Studies Reviewed  Today:  Abdominal ultrasound 08/24/2022: IMPRESSION: The proximal abdominal aorta is ectatic measuring up to 3.1 cm. Recommend follow-up ultrasound every 3 years.  Assessment and Plan:  1.  Asymptomatic ascending aortic dilatation, 4.2 cm ascending segment by chest CTA in December 2023.  Follow-up study obtained recently with results pending.  He had an abdominal ultrasound in June showing mildly ectatic proximal abdominal aorta, not frankly aneurysmal.  Continue to focus on blood pressure control and surveillance.   2.  Mild aortic regurgitation by echocardiogram in 2021, no significant murmur on examination.  Aortic valve trileaflet.   3.  Primary hypertension.  Continue to track blood pressure at home.  No change to Norvasc dosing at this time.  Disposition:  Follow up  1 year.  Signed, Jonelle Sidle, M.D., F.A.C.C. [facility]

## 2023-01-26 NOTE — Patient Instructions (Addendum)

## 2023-03-03 ENCOUNTER — Other Ambulatory Visit: Payer: Medicare Other

## 2023-03-03 DIAGNOSIS — E291 Testicular hypofunction: Secondary | ICD-10-CM

## 2023-03-04 ENCOUNTER — Telehealth: Payer: Self-pay

## 2023-03-04 LAB — TESTOSTERONE: Testosterone: 638 ng/dL (ref 264–916)

## 2023-03-04 LAB — HEMOGLOBIN AND HEMATOCRIT, BLOOD
Hematocrit: 49.6 % (ref 37.5–51.0)
Hemoglobin: 17.4 g/dL (ref 13.0–17.7)

## 2023-03-04 NOTE — Telephone Encounter (Signed)
-----   Message from Norleen Seltzer sent at 03/04/2023  9:52 AM EST ----- His Hgb is 17.4 which is normal but on the high side.  If he has been donating blood, he should probably go ahead and do that. ----- Message ----- From: Sammie Exie HERO, CMA Sent: 03/04/2023   7:56 AM EST To: Norleen Seltzer, MD  Please review.

## 2023-03-04 NOTE — Telephone Encounter (Signed)
 Called to relay message from MD. He said he isn't donating blood at the moment.

## 2023-03-13 IMAGING — CT CT ANGIO CHEST
2 of 7 series · 16 of 36 positions shown · IV contrast (omnipaque)
Comparison: None available.

CLINICAL DATA: 65-year-old male with history of ascending thoracic
aortic aneurysm. Follow-up study.

EXAM:
CT ANGIOGRAPHY CHEST WITH CONTRAST
TECHNIQUE: Multidetector CT imaging of the chest was performed using the
standard protocol during bolus administration of intravenous
contrast. Multiplanar CT image reconstructions and MIPs were
obtained to evaluate the vascular anatomy.
CONTRAST:  Seventy-five mL Omnipaque 350, intravenous

[Series 7: lungs · axial · 0.72mm/px · z∈[+1081,+1381]mm · 15 of 172 slices shown]
[im 11/172  lung]
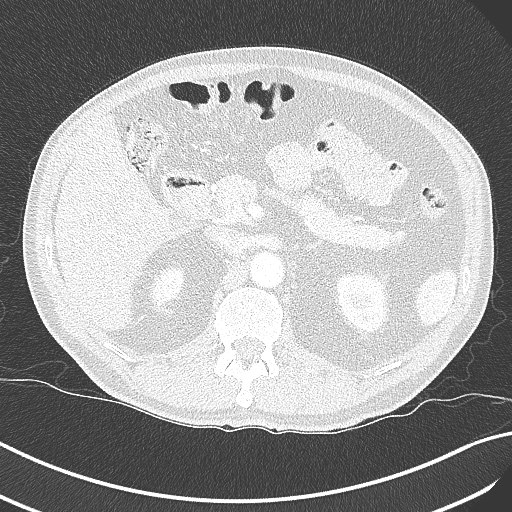
[im 22/172  mediastinal]
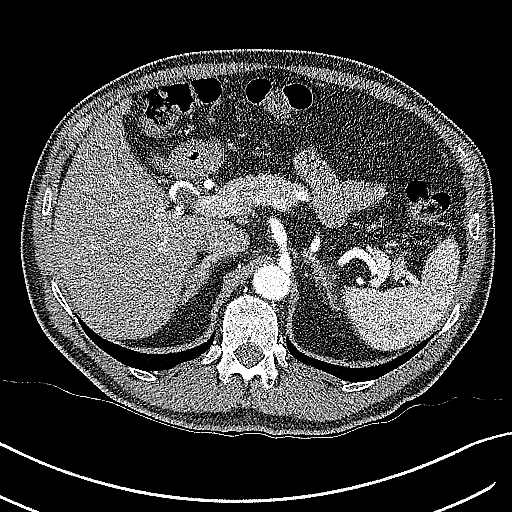
[im 33/172  lung]
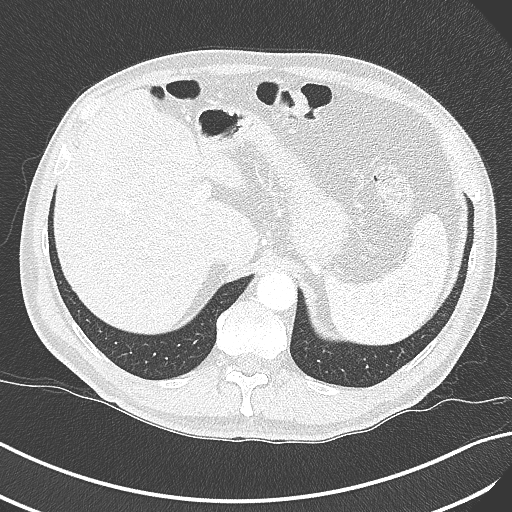
[im 43/172  mediastinal]
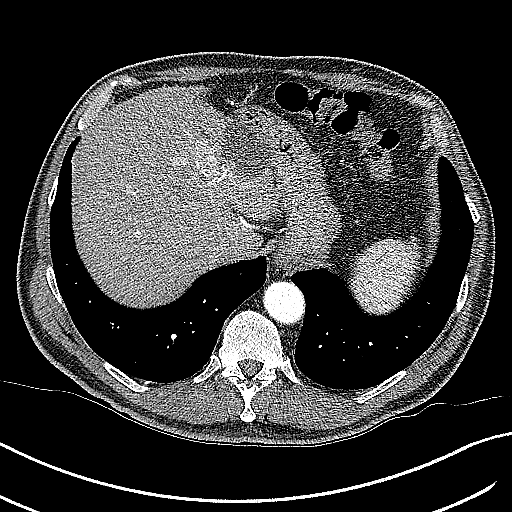
[im 54/172  lung]
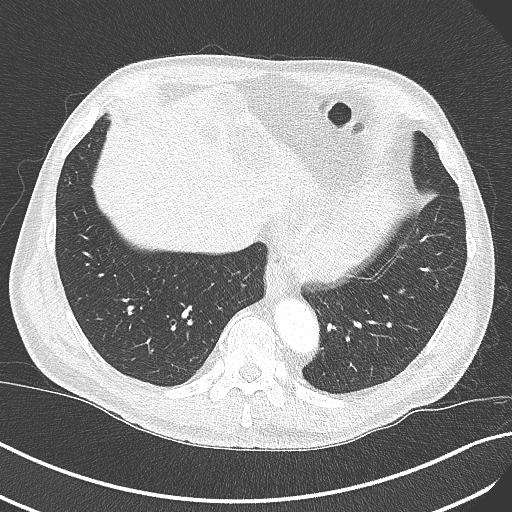
[im 65/172  mediastinal]
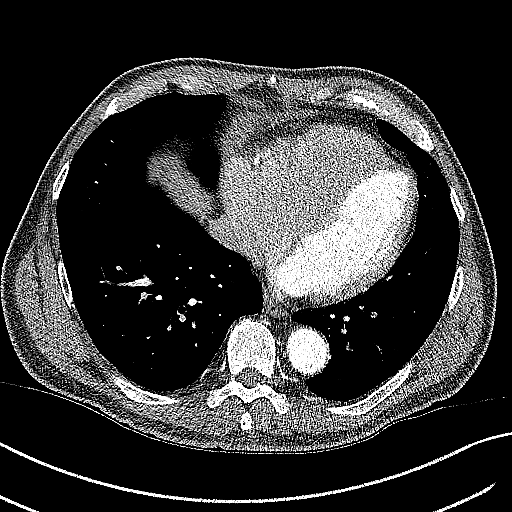
[im 75/172  lung]
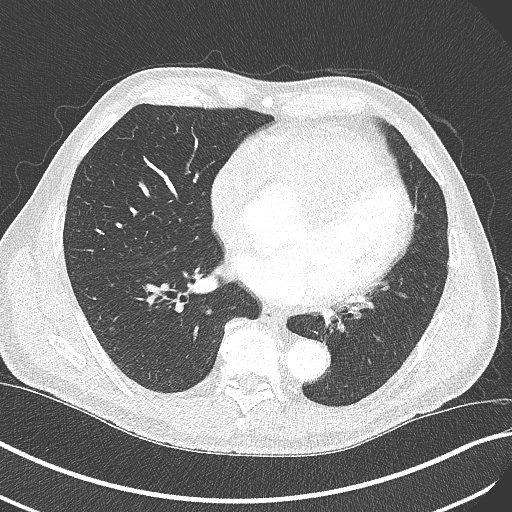
[im 86/172  mediastinal]
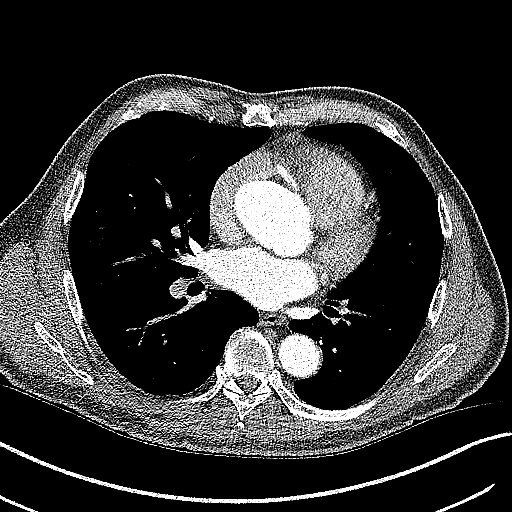
[im 97/172  lung]
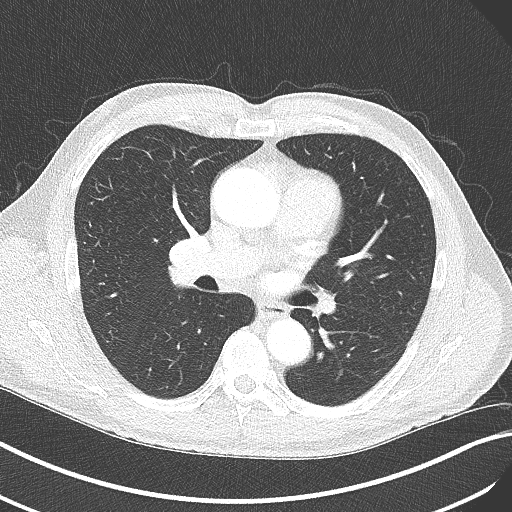
[im 107/172  mediastinal]
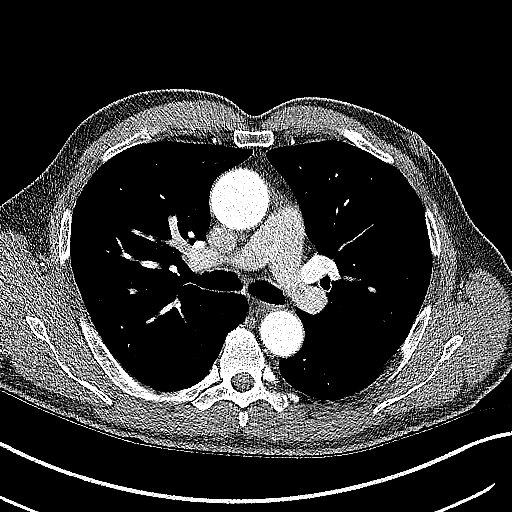
[im 118/172  lung]
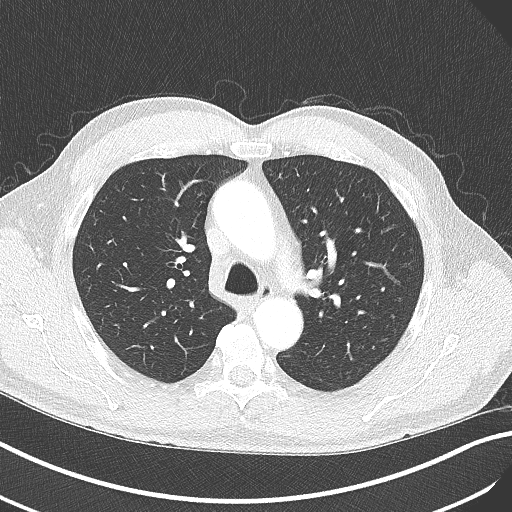
[im 129/172  mediastinal]
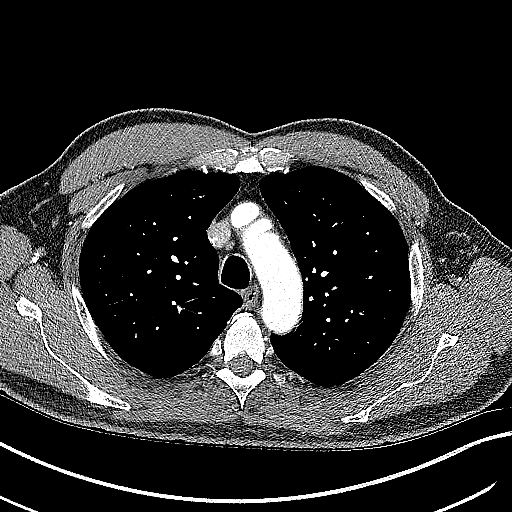
[im 139/172  lung]
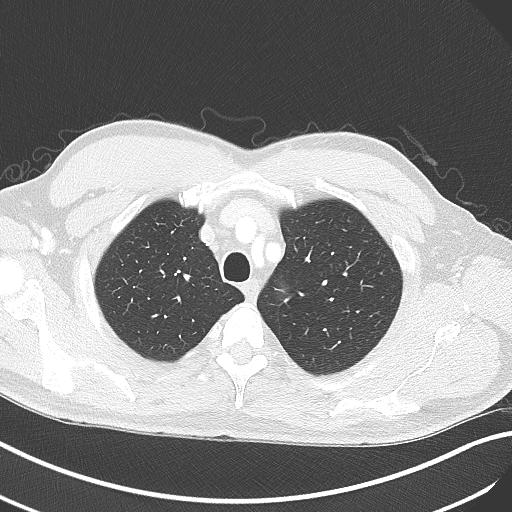
[im 150/172  mediastinal]
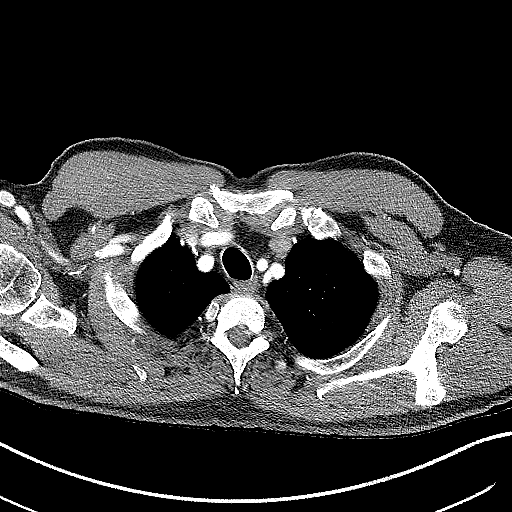
[im 161/172  lung]
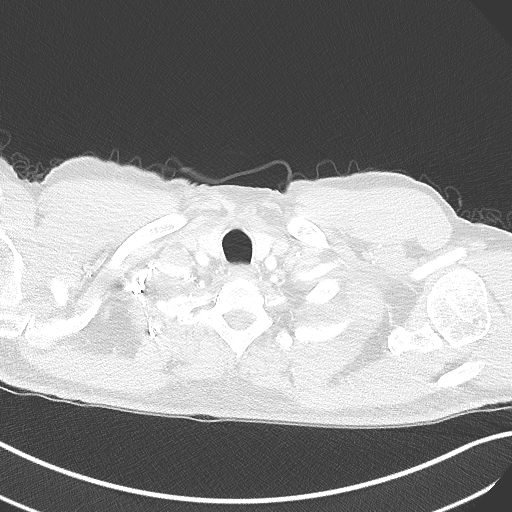

[Series 8: cor soft · coronal · 0.70mm/px · 1 of 157 slices shown]
[im 79/157  mediastinal]
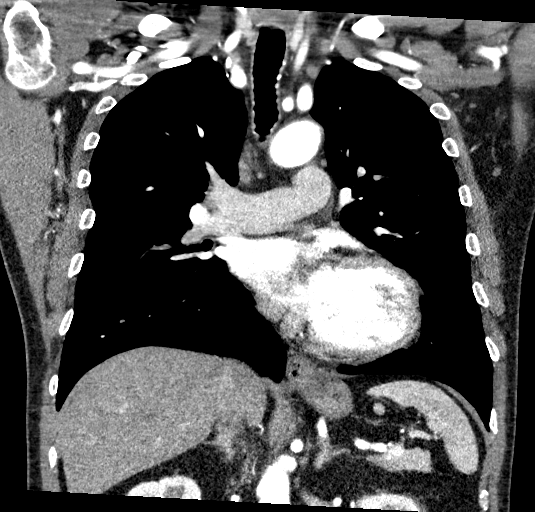

[16 of 36 positions shown; findings below may reference images not displayed]

FINDINGS: Cardiovascular: Preferential opacification of the thoracic aorta. No
evidence of thoracic aortic aneurysm or dissection. Normal heart
size. No pericardial effusion.

Sinues of Valsalva: 46 mm 44 x 39 mm

Sinotubular Junction: 38 mm

Ascending Aorta: 44 mm

Aortic Arch: 34 mm

Descending aorta: 31 mm at the level of the carina

Branch vessels: Conventional branching pattern. No significant
atherosclerotic changes.

Coronary arteries: Normal origins and courses. Mild atherosclerotic
calcifications.

Main pulmonary artery: 28 mm. No evidence of central pulmonary
embolism.

Pulmonary veins: No anomalous pulmonary venous return. No evidence
of left atrial appendage thrombus.

Upper abdominal vasculature: Within normal limits.

Mediastinum/Nodes: No enlarged mediastinal, hilar, or axillary lymph
nodes. Thyroid gland, trachea, and esophagus demonstrate no
significant findings.

Lungs/Pleura: No focal consolidations. No suspicious pulmonary
nodules. No pleural effusion or pneumothorax.

Upper Abdomen: Multiple scattered well-circumscribed fluid density
lesions throughout the hepatic parenchyma, the largest in the left
lobe measures 2 4.3 cm. The liver is otherwise normal in appearance
and attenuation without enhancing lesion. The gallbladder is
surgically absent. The remaining visualized upper abdomen is within
normal limits.

Musculoskeletal: No chest wall abnormality. No acute or significant
osseous findings.

Review of the MIP images confirms the above findings.
IMPRESSION: Vascular:

Fusiform aneurysm of the ascending thoracic aorta measuring up to
4.4 cm. Aneurysmal change of the sinuses of Valsalva, measuring up
to 4.6 cm. Recommend annual imaging followup by CTA or MRA. This
recommendation follows 3232
ACCF/AHA/AATS/ACR/ASA/SCA/JUMPER/ROSY/CHAVIANO/FELNER Guidelines for the
Diagnosis and Management of Patients with Thoracic Aortic Disease.
Circulation. 3232; 121: [id number]. Aortic aneurysm NOS ([id number])

Non-Vascular:

1. No acute intrathoracic abnormality.
2. Multifocal simple hepatic cysts.

## 2023-06-02 ENCOUNTER — Ambulatory Visit: Payer: Medicare Other | Admitting: Urology

## 2023-06-09 ENCOUNTER — Ambulatory Visit (INDEPENDENT_AMBULATORY_CARE_PROVIDER_SITE_OTHER): Payer: Medicare Other | Admitting: Urology

## 2023-06-09 VITALS — BP 106/73 | HR 90

## 2023-06-09 DIAGNOSIS — D751 Secondary polycythemia: Secondary | ICD-10-CM | POA: Diagnosis not present

## 2023-06-09 DIAGNOSIS — Z125 Encounter for screening for malignant neoplasm of prostate: Secondary | ICD-10-CM

## 2023-06-09 DIAGNOSIS — E291 Testicular hypofunction: Secondary | ICD-10-CM

## 2023-06-09 DIAGNOSIS — N400 Enlarged prostate without lower urinary tract symptoms: Secondary | ICD-10-CM | POA: Diagnosis not present

## 2023-06-09 MED ORDER — TESTOSTERONE 75 MG IL PLLT
450.0000 mg | PELLET | Freq: Once | Status: AC
Start: 2023-06-09 — End: 2023-06-09
  Administered 2023-06-09: 450 mg

## 2023-06-09 NOTE — Progress Notes (Signed)
 Subjective: 1. Hypogonadism in male   2. Secondary polycythemia   3. Encounter for screening for malignant neoplasm of prostate   4. Benign prostatic hyperplasia without lower urinary tract symptoms       4/10//25: Tim Rogers returns today for a 6 month testopel implant with the last on 11/30/22.  He has had to use some topical testosterone for the last week or so. His last labs were in January with a Hgb of 17.4 and a normal T.    11/08/22: Tim Rogers returns today in f/u.  His last T was 861 in 4/24.  His Hgb on 10/15/22 was 16.7.   [name] for a testopel reimplant.  He had two appointments cancelled because of the lack of pellets.   03/18/22: Tim Rogers returns today for testopel replacement.  His T Rogers 572 with a Hgb of 16.8 which Rogers down some.  He can tell the pellets are depleting since his anxiety Rogers recurring. He has no other complatins.  His PSA remains low at 0.8.   12/03/21: Tim Rogers returns today in f/u for his history noted below.  His T Rogers 812 and his Hgb Rogers 17.3.  He hasn't had to donate blood in 9 months.   His libido remains low but his energy Rogers good.  He has good erectile function.  [name] voiding well with no nocturia.    08/27/21: Tim Rogers returns today for testopel insertion for his history noted below.  His last labs were in 06/18/21 and his T was 765 and Hgb was 17.1.   05/14/21: Tim Rogers returns today in f/u for his history of hypogonadism and secondary polycythemia.   He had Testopel on 12/22 and his T Rogers 628 with a Hgb of 17.7 and Hct 51.6%.  [name] doing well on treatment but didn't get the "kick" that he got with the injections.  His PSA Rogers 1.1.    02/19/21: Tim Rogers returns today in f/u for a testopel implant.  His Hgb Rogers down to normal after two phlebotomies.    11/27/20: Tim Rogers returns today in f/u.  He reports [name] doing well and the discomfort with ejaculation has improved and Rogers minimal.  A renal stone study shows some hepatic cysts and prostate stones but no other significant findings.  His IPSS Rogers 0 and his  UA Rogers clear.  He remains on TRT with topicals but was interested in testopel.  He does report a prior history of elevated Hemoglobins with TRT.    Tim Rogers Hx: Tim Rogers a 68 yo male who Rogers sent for evaluation of perineal pain.  He got an antibiotic for 5 days and his symptoms recurred.  He got another 7 days but the symptoms recurred.  He has now been on the bactrim for 7 weeks.  He has no further pain with ejaculation but he still can have some suprapubic discomfort.  He has increased symptoms with tea or diet coke.  Beer will also impact it.  [name] voiding well with an IPSS of 1.  He has minimal change in his bowel habits.   He has had no documented stones or Tim Rogers surgery.  He thinks he may have passed a stone 4 weeks ago which he had a sharp burning at the tip of the penis x 1. He had a CT AP in 2006 and there were no stones.  He had UA's on 5/24 and 7/13 that had 0-2 RBC's on one.  He has 0-2 RBC's today.   HIs PSA  was 1.1 on 05/14/20.  [name] on TRT currently with a topical but he had been getting pellets from [facility].  He takes a lot of Vitamins.  His most recent T Rogers about 1000.   ROS:  Review of Systems  Psychiatric/Behavioral:  The patient Rogers nervous/anxious.   All other systems reviewed and are negative.   Allergies  Allergen Reactions   Lidocaine-Epinephrine (Pf) Shortness Of Breath, Anxiety and Palpitations    Past Medical History:  Diagnosis Date   Acid reflux    Allergy February   Anxiety    2003   Ascending aortic aneurysm (HCC)    GERD (gastroesophageal reflux disease)    Low testosterone    PVCs (premature ventricular contractions)     Past Surgical History:  Procedure Laterality Date   CHOLECYSTECTOMY     COLONOSCOPY WITH PROPOFOL N/A 10/01/2022   Procedure: COLONOSCOPY WITH PROPOFOL;  Surgeon: Tim Garden, MD;  [facility];  Service: Gastroenterology;  Laterality: N/A;  8:15AM;ASA 1   ESOPHAGOGASTRODUODENOSCOPY N/A 09/21/2017   Procedure:  ESOPHAGOGASTRODUODENOSCOPY (EGD);  Surgeon: Tim Corporal, MD;  [facility];  Service: Endoscopy;  Laterality: N/A;  2:55   POLYPECTOMY  10/01/2022   Procedure: POLYPECTOMY INTESTINAL;  Surgeon: Tim Garden, MD;  [facility];  Service: Gastroenterology;;   TIF procedure      Social History   Socioeconomic History   Marital status: Single    Spouse name: Not on file   Number of children: 0   Years of education: 83   Highest education level: Master's degree (e.g., MA, MS, MEng, MEd, MSW, MBA)  Occupational History   Occupation: retired  Tobacco Use   Smoking status: Never   Smokeless tobacco: Former    Types: Snuff  Vaping Use   Vaping status: Never Used  Substance and Sexual Activity   Alcohol use: Yes    Alcohol/week: 2.0 standard drinks of alcohol    Types: 2 Cans of beer per week    Comment: couple of beer sometimes daily   Drug use: No   Sexual activity: Not Currently    Birth control/protection: None  Other Topics Concern   Not on file  Social History Narrative   Retired.Single.Lives alone.   Ex-[employer].   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   Social Drivers of Corporate investment banker Strain: Low Risk  (11/23/2022)   Overall Financial Resource Strain (CARDIA)    Difficulty of Paying Living Expenses: Not hard at all  Food Insecurity: No Food Insecurity (11/23/2022)   Hunger Vital Sign    Worried About Running Out of Food in the Last Year: Never true    Ran Out of Food in the Last Year: Never true  Transportation Needs: No Transportation Needs (11/23/2022)   PRAPARE - Administrator, Civil Service (Medical): No    Lack of Transportation (Non-Medical): No  Physical Activity: Sufficiently Active (11/23/2022)   Exercise Vital Sign    Days of Exercise per Week: 5 days    Minutes of Exercise per Session: 90 min  Stress: No Stress  Concern Present (11/23/2022)   [employer] of Occupational Health - Occupational Stress Questionnaire    Feeling of Stress : Not at all  Social Connections: Moderately Integrated (11/23/2022)   Social Connection and Isolation Panel [NHANES]    Frequency  of Communication with Friends and Family: More than three times a week    Frequency of Social Gatherings with Friends and Family: More than three times a week    Attends Religious Services: More than 4 times per year    Active Member of [employer] or Organizations: Yes    Attends Engineer, structural: More than 4 times per year    Marital Status: Never married  Intimate Partner Violence: Not At Risk (11/29/2022)   Humiliation, Afraid, Rape, and Kick questionnaire    Fear of Current or Ex-Partner: No    Emotionally Abused: No    Physically Abused: No    Sexually Abused: No    Family History  Problem Relation Age of Onset   Alcohol abuse Mother    Cancer Mother        lung   COPD Mother    Heart disease Mother    Early death Mother        age 64 multiple factors   Cancer Sister        breast    Anti-infectives: Anti-infectives (From admission, onward)    None       Current Outpatient Medications  Medication Sig Dispense Refill   amLODipine (NORVASC) 10 MG tablet TAKE 1 TABLET(10 MG) BY MOUTH DAILY 90 tablet 1   azelastine (ASTELIN) 0.1 % nasal spray Place 1 spray into both nostrils 2 (two) times daily. 30 mL 3   Cholecalciferol (VITAMIN D-3) 125 MCG (5000 UT) TABS Take 10,000 Units by mouth daily at 12 noon.     hydrocortisone (ANUSOL-HC) 2.5 % rectal cream Place 1 Application rectally 2 (two) times daily. 56 g 2   loratadine (CLARITIN) 10 MG tablet Take 10 mg by mouth daily.     OVER THE COUNTER MEDICATION AG1 - one daily     pantoprazole (PROTONIX) 40 MG tablet TAKE 1 TABLET BY MOUTH EVERY DAY AS NEEDED 90 tablet 1   valACYclovir (VALTREX) 1000 MG tablet Take 1,000 mg by mouth 2 (two) times daily.      Testosterone 20 % CREA Apply 3 clicks topically qam. (Patient not taking: Reported on 06/09/2023) 100 g 2   No current facility-administered medications for this visit.     Objective: Vital signs in last 24 hours: BP 106/73   Pulse 90   Intake/Output from previous day: No intake/output data recorded. Intake/Output this shift: @IOTHISSHIFT @   Physical Exam Vitals reviewed.  Constitutional:      Appearance: Normal appearance.  Neurological:     Mental Status: [name] alert.     Lab Results:  Recent Results (from the past 2160 hours)  PSA     Status: None   Collection Time: 06/09/23  4:31 PM  Result Value Ref Range   Prostate Specific Ag, Serum 1.2 0.0 - 4.0 ng/mL    Comment: Roche ECLIA methodology. According to the American Urological Association, Serum PSA should decrease and remain at undetectable levels after radical prostatectomy. The AUA defines biochemical recurrence as an initial PSA value 0.2 ng/mL or greater followed by a subsequent confirmatory PSA value 0.2 ng/mL or greater. Values obtained with different assay methods or kits cannot be used interchangeably. Results cannot be interpreted as absolute evidence of the presence or absence of malignant disease.   Testosterone     Status: None   Collection Time: 06/09/23  4:31 PM  Result Value Ref Range   Testosterone 853 264 - 916 ng/dL    Comment: Adult male reference interval  Rogers based on a population of healthy nonobese males (BMI <30) between 18 and 27 years old. Travison, et.al. JCEM [phone]. PMID: [id number].   Hemoglobin and hematocrit, blood     Status: Abnormal   Collection Time: 06/09/23  4:31 PM  Result Value Ref Range   Hemoglobin 19.1 (H) 13.0 - 17.7 g/dL   Hematocrit 84.6 (H) 96.2 - 51.0 %    .brief  BMET No results for input(s): "NA", "K", "CL", "CO2", "GLUCOSE", "BUN", "CREATININE", "CALCIUM" in the last 72 hours. PT/INR No results for input(s): "LABPROT", "INR" in the last 72  hours. ABG No results for input(s): "PHART", "HCO3" in the last 72 hours.  Invalid input(s): "PCO2", "PO2"   Studies/Results: No results found.   Procedure:  Testopel implant.  The right upper buttock was prepped with betadine and draped with sterile towels.  The insertion site was chosen and then infiltrated with 12ml of 2% lidocaine without epinepherine.  A small stab wound was made with the 15 blade and the insertion trocar was the passed into the subcutaneous fat.  6 Testopel pellets were then deployed one at a time.   The trocar was removed and then the wound was cleaned with alcohol which was then was closed with overlapping 1/2 inch steristrips.  A dressing of 4x4's and hypofix was applied and he was then rolled supine where he lay for 5 minutes to apply pressure to the wound.   There was mild oozing but  no other complications.   Assessment/Plan:  Hypogonadism.  He was doing well on testopel.  Labs today, in 3 months and in 6 months at f/u.  He has testosterone cream to augment the pellets and the end of his treatment interval.   History of acquired polycythemia.   H&H Rogers 17.4.  Repeat today.   BPH without symptoms.  PSA today.    Meds ordered this encounter  Medications   Testosterone PLLT 450 mg     Orders Placed This Encounter  Procedures   PSA   Testosterone   Hemoglobin and hematocrit, blood   Testosterone    Standing Status:   Future    Expected Date:   09/08/2023    Expiration Date:   06/08/2024   Hemoglobin and hematocrit, blood    Standing Status:   Future    Expected Date:   09/08/2023    Expiration Date:   06/08/2024   Testosterone    Standing Status:   Future    Expected Date:   12/09/2023    Expiration Date:   06/08/2024   Hemoglobin and hematocrit, blood    Standing Status:   Future    Expected Date:   12/09/2023    Expiration Date:   06/08/2024     Return in about 6 months (around 12/09/2023) for for testopel.  any available MD. .        Tim Rogers 06/11/2023  Patient ID: Melony Squibb, male   DOB: 1955/09/13, 68 y.o.   MRN: [id number]

## 2023-06-10 LAB — HEMOGLOBIN AND HEMATOCRIT, BLOOD
Hematocrit: 55.3 % — ABNORMAL HIGH (ref 37.5–51.0)
Hemoglobin: 19.1 g/dL — ABNORMAL HIGH (ref 13.0–17.7)

## 2023-06-10 LAB — PSA: Prostate Specific Ag, Serum: 1.2 ng/mL (ref 0.0–4.0)

## 2023-06-10 LAB — TESTOSTERONE: Testosterone: 853 ng/dL (ref 264–916)

## 2023-06-11 ENCOUNTER — Encounter: Payer: Self-pay | Admitting: Urology

## 2023-06-13 ENCOUNTER — Telehealth: Payer: Self-pay

## 2023-06-13 NOTE — Telephone Encounter (Signed)
 What is the frequency of phlebotomy ? Do not perform phlebotomy if donor's hemoglobin is less than ? Do you want one unit of blood ? If not how many mL ?

## 2023-06-14 ENCOUNTER — Telehealth: Payer: Self-pay

## 2023-06-14 NOTE — Telephone Encounter (Signed)
 Attempted to schedule appt w/ [facility] unable to do so, pt closest [facility]  location stated the pt is permanently deferred  from giving blood at that location and unable to tell me why attempted to call pt to let him know and have him schedule the appointment himself pt did not answer lvm to call back

## 2023-06-14 NOTE — Telephone Encounter (Signed)
 Pt was called b/c I was unable to schedule pt w/ [facility] due to pt being permanently deferred per representative. 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 now longer does therapeutic blood draws

## 2023-06-14 NOTE — Telephone Encounter (Signed)
 [facility] no longer does it. Do you have any other places you'd recommend ?

## 2023-06-16 NOTE — Telephone Encounter (Signed)
 Called and lvm that a mychart message will be sent regarding his blood draw

## 2023-07-17 ENCOUNTER — Other Ambulatory Visit: Payer: Self-pay | Admitting: Cardiology

## 2023-07-20 ENCOUNTER — Other Ambulatory Visit: Payer: Self-pay | Admitting: Internal Medicine

## 2023-07-20 DIAGNOSIS — K219 Gastro-esophageal reflux disease without esophagitis: Secondary | ICD-10-CM

## 2023-08-15 ENCOUNTER — Ambulatory Visit
Admission: EM | Admit: 2023-08-15 | Discharge: 2023-08-15 | Disposition: A | Discharge status: Home / Self Care | Attending: Nurse Practitioner | Admitting: Nurse Practitioner

## 2023-08-15 DIAGNOSIS — L720 Epidermal cyst: Secondary | ICD-10-CM

## 2023-08-15 MED ORDER — DOXYCYCLINE HYCLATE 100 MG PO TABS: 100.0000 mg | ORAL_TABLET | Freq: Two times a day (BID) | ORAL | 0 refills | Status: AC

## 2023-08-15 NOTE — Discharge Instructions (Signed)
 Take medication as prescribed. You may take over-the-counter Tylenol as needed for pain, fever, or general discomfort. Apply warm compresses to the area at least 3-4 times daily while symptoms persist. If the area begins to drain, keep the area covered. Monitor the area for worsening to include increased redness, foul-smelling drainage from the site, or increased pain.  If you have any of the symptoms, seek care. As discussed, please follow-up with your dermatologist to discuss removal of the cyst. Follow-up as needed.

## 2023-08-15 NOTE — ED Provider Notes (Signed)
 [facility]    CSN: [id number] Arrival date & time: 08/15/23  0851      History   Chief Complaint Chief Complaint  Patient presents with   Abscess    HPI Tim Rogers is a 68 y.o. male.   The history is provided by the patient.   Patient presents for complaints of a cyst on his upper left buttock that has been there for several years.  Patient states he has noticed that the area has become more enlarged and red over the past week.  He denies fever, chills, drainage from the site, chest pain, abdominal pain, nausea, vomiting, diarrhea, or rash.  States that he was planning on speaking to his dermatologist about the cyst in the past, but states I never got around to asking him about it.  States he has been applying RICE to the affected area. Past Medical History:  Diagnosis Date   Acid reflux    Allergy February   Anxiety    2003   Ascending aortic aneurysm (HCC)    GERD (gastroesophageal reflux disease)    Low testosterone     PVCs (premature ventricular contractions)     Patient Active Problem List   Diagnosis Date Noted   Screening for thyroid disorder 10/09/2021   Special screening for malignant neoplasms, colon 10/09/2021   Coronary artery calcification 05/15/2021   Allergies 05/15/2021   Hemorrhoids 11/20/2020   Anal burning 11/20/2020   Anal itching 11/20/2020   Gastroesophageal reflux disease without esophagitis 08/15/2017   Positive PPD 10/08/2016   Polycythemia 10/08/2016   Hypogonadism in male 10/01/2016   Environmental allergies 10/01/2016   Chronic GERD 10/01/2016   Thoracic ascending aortic aneurysm (HCC) 10/01/2016   Recurrent herpes simplex virus (HSV) infection of buttock 10/01/2016    Past Surgical History:  Procedure Laterality Date   CHOLECYSTECTOMY     COLONOSCOPY WITH PROPOFOL  N/A 10/01/2022   Procedure: COLONOSCOPY WITH PROPOFOL ;  Surgeon: Urban Garden, MD;  [facility];  Service:  Gastroenterology;  Laterality: N/A;  8:15AM;ASA 1   ESOPHAGOGASTRODUODENOSCOPY N/A 09/21/2017   Procedure: ESOPHAGOGASTRODUODENOSCOPY (EGD);  Surgeon: Ruby Corporal, MD;  [facility];  Service: Endoscopy;  Laterality: N/A;  2:55   POLYPECTOMY  10/01/2022   Procedure: POLYPECTOMY INTESTINAL;  Surgeon: Urban Garden, MD;  [facility];  Service: Gastroenterology;;   TIF procedure         Home Medications    Prior to Admission medications   Medication Sig Start Date End Date Taking? Authorizing Provider  doxycycline (VIBRA-TABS) 100 MG tablet Take 1 tablet (100 mg total) by mouth 2 (two) times daily for 7 days. 08/15/23 08/22/23 Yes Leath-Warren, Belen Bowers, NP  amLODipine  (NORVASC ) 10 MG tablet TAKE 1 TABLET(10 MG) BY MOUTH DAILY 07/18/23   Gerard Knight, MD  azelastine  (ASTELIN ) 0.1 % nasal spray Place 1 spray into both nostrils 2 (two) times daily. 08/06/21   Corbin Dess, PA-C  Cholecalciferol (VITAMIN D -3) 125 MCG (5000 UT) TABS Take 10,000 Units by mouth daily at 12 noon.    [provider]  hydrocortisone  (ANUSOL -HC) 2.5 % rectal cream Place 1 Application rectally 2 (two) times daily. 01/06/23   Carlan, Chelsea L, NP  loratadine (CLARITIN) 10 MG tablet Take 10 mg by mouth daily.    [provider]  OVER THE COUNTER MEDICATION AG1 - one daily    [provider]  pantoprazole  (PROTONIX ) 40 MG tablet TAKE 1 TABLET BY MOUTH  EVERY DAY AS NEEDED 07/20/23   Tobi Fortes, MD  Testosterone  20 % CREA Apply 3 clicks topically qam. Patient not taking: Reported on 06/09/2023 11/11/22   Wrenn, John, MD  valACYclovir (VALTREX) 1000 MG tablet Take 1,000 mg by mouth 2 (two) times daily. 08/28/20   [provider]    Family History Family History  Problem Relation Age of Onset   Alcohol abuse Mother    Cancer Mother        lung   COPD Mother    Heart disease Mother    Early death Mother        age 73 multiple  factors   Cancer Sister        breast    Social History Social History   Tobacco Use   Smoking status: Never   Smokeless tobacco: Former    Types: Snuff  Vaping Use   Vaping status: Never Used  Substance Use Topics   Alcohol use: Yes    Alcohol/week: 2.0 standard drinks of alcohol    Types: 2 Cans of beer per week    Comment: couple of beer sometimes daily   Drug use: No     Allergies   Lidocaine -epinephrine (pf)   Review of Systems Review of Systems Per HPI  Physical Exam Triage Vital Signs ED Triage Vitals [08/15/23 0904]  Encounter Vitals Group     BP 125/78     Girls Systolic BP Percentile      Girls Diastolic BP Percentile      Boys Systolic BP Percentile      Boys Diastolic BP Percentile      Pulse Rate 72     Resp 18     Temp 98 F (36.7 C)     Temp Source Oral     SpO2 97 %     Weight      Height      Head Circumference      Peak Flow      Pain Score 0     Pain Loc      Pain Education      Exclude from Growth Chart    No data found.  Updated Vital Signs BP 125/78 (BP Location: Right Arm)   Pulse 72   Temp 98 F (36.7 C) (Oral)   Resp 18   SpO2 97%   Visual Acuity Right Eye Distance:   Left Eye Distance:   Bilateral Distance:    Right Eye Near:   Left Eye Near:    Bilateral Near:     Physical Exam Vitals and nursing note reviewed.  Constitutional:      General: He is not in acute distress.    Appearance: Normal appearance.  HENT:     Head: Normocephalic.   Eyes:     Extraocular Movements: Extraocular movements intact.     Pupils: Pupils are equal, round, and reactive to light.   Pulmonary:     Effort: Pulmonary effort is normal.   Musculoskeletal:     Cervical back: Normal range of motion.   Skin:    General: Skin is warm and dry.       Neurological:     General: No focal deficit present.     Mental Status: He is alert and oriented to person, place, and time.   Psychiatric:        Mood and Affect: Mood normal.         Behavior: Behavior normal.  UC Treatments / Results  Labs (all labs ordered are listed, but only abnormal results are displayed) Labs Reviewed - No data to display  EKG   Radiology No results found.  Procedures Procedures (including critical care time)  Medications Ordered in UC Medications - No data to display  Initial Impression / Assessment and Plan / UC Course  I have reviewed the triage vital signs and the nursing notes.  Pertinent labs & imaging results that were available during my care of the patient were reviewed by me and considered in my medical decision making (see chart for details).  Patient with epidermoid cyst noted to the left buttock.  Area is not amenable to I&D at this time.  Patient was advised of same.  Will start patient on doxycycline 100 mg twice daily for the next 7 days.  Supportive care recommendations were provided and discussed with the patient to include over-the-counter analgesics, apply warm compresses, and cleansing the area with an antibacterial soap.  Patient was advised he will need to follow-up with dermatology to discuss removal of the cyst.  Patient was in agreement with this plan of care and verbalizes understanding.  All questions were answered.  Patient stable for discharge.  Final Clinical Impressions(s) / UC Diagnoses   Final diagnoses:  Epidermoid cyst     Discharge Instructions      Take medication as prescribed. You may take over-the-counter Tylenol as needed for pain, fever, or general discomfort. Apply warm compresses to the area at least 3-4 times daily while symptoms persist. If the area begins to drain, keep the area covered. Monitor the area for worsening to include increased redness, foul-smelling drainage from the site, or increased pain.  If you have any of the symptoms, seek care. As discussed, please follow-up with your dermatologist to discuss removal of the cyst. Follow-up as needed.     ED  Prescriptions     Medication Sig Dispense Auth. Provider   doxycycline (VIBRA-TABS) 100 MG tablet Take 1 tablet (100 mg total) by mouth 2 (two) times daily for 7 days. 14 tablet Leath-Warren, Belen Bowers, NP      PDMP not reviewed this encounter.   Hardy Lia, NP 08/15/23 1241

## 2023-08-15 NOTE — ED Triage Notes (Signed)
 Pt reports he has a boil on his left buttock x 1 week. States is has been there for years but has enlarged since a week ago and red

## 2023-09-12 ENCOUNTER — Other Ambulatory Visit

## 2023-09-12 DIAGNOSIS — E291 Testicular hypofunction: Secondary | ICD-10-CM

## 2023-09-12 DIAGNOSIS — D751 Secondary polycythemia: Secondary | ICD-10-CM

## 2023-09-13 ENCOUNTER — Ambulatory Visit: Payer: Self-pay

## 2023-09-13 LAB — HEMOGLOBIN AND HEMATOCRIT, BLOOD
Hematocrit: 50.7 % (ref 37.5–51.0)
Hemoglobin: 17.3 g/dL (ref 13.0–17.7)

## 2023-09-13 LAB — TESTOSTERONE: Testosterone: 786 ng/dL (ref 264–916)

## 2023-10-17 ENCOUNTER — Encounter: Payer: Medicare Other | Admitting: Internal Medicine

## 2023-11-10 ENCOUNTER — Other Ambulatory Visit: Payer: Self-pay | Admitting: *Deleted

## 2023-11-10 DIAGNOSIS — I10 Essential (primary) hypertension: Secondary | ICD-10-CM

## 2023-11-10 DIAGNOSIS — I77819 Aortic ectasia, unspecified site: Secondary | ICD-10-CM

## 2023-11-10 DIAGNOSIS — I7121 Aneurysm of the ascending aorta, without rupture: Secondary | ICD-10-CM

## 2023-11-15 ENCOUNTER — Telehealth: Payer: Self-pay | Admitting: Cardiology

## 2023-11-15 NOTE — Telephone Encounter (Signed)
 Checking percert on the following patient for testing scheduled at [facility].   CT ANGIO CHEST AORTA W/CM &/O     01-20-24

## 2023-12-08 ENCOUNTER — Other Ambulatory Visit

## 2023-12-12 ENCOUNTER — Other Ambulatory Visit

## 2023-12-12 DIAGNOSIS — E291 Testicular hypofunction: Secondary | ICD-10-CM

## 2023-12-12 DIAGNOSIS — D751 Secondary polycythemia: Secondary | ICD-10-CM

## 2023-12-13 LAB — HEMOGLOBIN AND HEMATOCRIT, BLOOD
Hematocrit: 49.4 % (ref 37.5–51.0)
Hemoglobin: 17.1 g/dL (ref 13.0–17.7)

## 2023-12-13 LAB — TESTOSTERONE: Testosterone: 448 ng/dL (ref 264–916)

## 2023-12-14 ENCOUNTER — Encounter (INDEPENDENT_AMBULATORY_CARE_PROVIDER_SITE_OTHER): Payer: Self-pay | Admitting: Gastroenterology

## 2023-12-19 ENCOUNTER — Ambulatory Visit: Admitting: Urology

## 2023-12-19 VITALS — BP 100/67 | HR 77

## 2023-12-19 DIAGNOSIS — E291 Testicular hypofunction: Secondary | ICD-10-CM | POA: Diagnosis not present

## 2023-12-19 DIAGNOSIS — N400 Enlarged prostate without lower urinary tract symptoms: Secondary | ICD-10-CM

## 2023-12-19 MED ORDER — LIDOCAINE HCL 2 % IJ SOLN
10.0000 mL | Freq: Once | INTRAMUSCULAR | Status: AC
Start: 2023-12-19 — End: 2023-12-19
  Administered 2023-12-19: 200 mg

## 2023-12-19 MED ORDER — TESTOSTERONE 75 MG IL PLLT
PELLET | Freq: Once | Status: AC
Start: 2023-12-19 — End: 2023-12-19
  Administered 2023-12-19: 450 mg

## 2023-12-19 NOTE — Progress Notes (Unsigned)
 12/19/2023 3:19 PM   Tim Rogers [birth year] [id number]  Referring provider: Melvenia Rogers BRAVO, MD [address] [place],  [place] [address]  No chief complaint on file.   HPI:  New patient for me-  1) low testosterone -he has had polycythemia with a January 2025 hemoglobin of 17.4 and a normal testosterone .  He will donate blood.  Testosterone  has improved his energy, anxiety and overall sexual function.  Prior injection and topical therapy.  He also takes a lot of vitamins.  Treated with Testopel .  Usually every 6 months.  Last implant on 06/09/2023.  2) BPH without LUTS-IPSS 0-1.  2022 PSA was 1.1.  PSA was 0.8 in January 2024 and 1.2 in April 2025.  3) pelvic pain-noted with ejaculation in 2022.  Improved.  A renal stone study shows some hepatic cysts and prostate stones but no other significant findings.  Treated with antibiotics.  Today, seen for the above.  Chart reviewed and evaluation for testosterone  done.  We went over the nature risk benefits and alternatives to testosterone  including blood clots, polycythemia and MACE.  Discussed relation of testosterone  and prostate cancer.  All questions answered. Discussed [place] study and data. His Oct 2025 T 448 - place 6. Hct 49.  Discussed this is probably the earliest I would put them in.  We could also wait a month. He said hell yeah - he needs it today and needs T around 800. He can tell it's low - feels like shit.    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     PMH: Past Medical History:  Diagnosis Date   Acid reflux    Allergy February   Anxiety    2003   Ascending aortic aneurysm    GERD (gastroesophageal reflux disease)    Low testosterone     PVCs (premature ventricular contractions)     Surgical History: Past Surgical History:  Procedure Laterality Date   CHOLECYSTECTOMY     COLONOSCOPY WITH PROPOFOL  N/A 10/01/2022   Procedure:  COLONOSCOPY WITH PROPOFOL ;  Surgeon: Tim Angelia Sieving, MD;  [facility];  Service: Gastroenterology;  Laterality: N/A;  8:15AM;ASA 1   ESOPHAGOGASTRODUODENOSCOPY N/A 09/21/2017   Procedure: ESOPHAGOGASTRODUODENOSCOPY (EGD);  Surgeon: Tim Claudis PENNER, MD;  [facility];  Service: Endoscopy;  Laterality: N/A;  2:55   POLYPECTOMY  10/01/2022   Procedure: POLYPECTOMY INTESTINAL;  Surgeon: Tim Angelia Sieving, MD;  [facility];  Service: Gastroenterology;;   TIF procedure      Home Medications:  Allergies as of 12/19/2023       Reactions   Lidocaine -epinephrine (pf) Shortness Of Breath, Anxiety, Palpitations        Medication List        Accurate as of December 19, 2023  3:19 PM. If you have any questions, ask your nurse or doctor.          amLODipine  10 MG tablet Commonly known as: NORVASC  TAKE 1 TABLET(10 MG) BY MOUTH DAILY   azelastine  0.1 % nasal spray Commonly known as: ASTELIN  Place 1 spray into both nostrils 2 (two) times daily.   hydrocortisone  2.5 % rectal cream Commonly known as: Anusol -HC Place 1 Application rectally 2 (two) times daily.   loratadine 10 MG tablet Commonly known as: CLARITIN Take 10 mg by mouth daily.   OVER THE COUNTER MEDICATION AG1 - one daily   pantoprazole  40 MG  tablet Commonly known as: PROTONIX  TAKE 1 TABLET BY MOUTH EVERY DAY AS NEEDED   Testosterone  20 % Crea Apply 3 clicks topically qam.   valACYclovir 1000 MG tablet Commonly known as: VALTREX Take 1,000 mg by mouth 2 (two) times daily.   Vitamin D -3 125 MCG (5000 UT) Tabs Take 10,000 Units by mouth daily at 12 noon.        Allergies:  Allergies  Allergen Reactions   Lidocaine -Epinephrine (Pf) Shortness Of Breath, Anxiety and Palpitations    Family History: Family History  Problem Relation Age of Onset   Alcohol abuse Mother    Cancer Mother        lung   COPD Mother    Heart disease Mother    Early death  Mother        age 57 multiple factors   Cancer Sister        breast    Social History:  reports that he has never smoked. He has quit using smokeless tobacco.  His smokeless tobacco use included snuff. He reports current alcohol use of about 2.0 standard drinks of alcohol per week. He reports that he does not use drugs.   Physical Exam: There were no vitals taken for this visit.  Constitutional:  Alert and oriented, No acute distress. HEENT: Tim Rogers AT, moist mucus membranes.  Trachea midline, no masses. Cardiovascular: No clubbing, cyanosis, or edema. Respiratory: Normal respiratory effort, no increased work of breathing. GI: Abdomen is soft, nontender, nondistended, no abdominal masses GU: No CVA tenderness Skin: No rashes, bruises or suspicious lesions. Neurologic: Grossly intact, no focal deficits, moving all 4 extremities. Psychiatric: Normal mood and affect.  PROCEDURE: The left buttock was prepped and draped in the usual sterile fashion.  Lidocaine  plain was instilled and a small incision made.  6 Testopel  pellets were tunneled and delivered in typical V shaped fashion.  Tegaderm and bandage was placed.   Laboratory Data: Lab Results  Component Value Date   WBC 5.9 10/15/2022   HGB 17.1 12/12/2023   HCT 49.4 12/12/2023   MCV 89 10/15/2022   PLT 245 10/15/2022    Lab Results  Component Value Date   CREATININE 1.18 10/15/2022    No results found for: PSA  Lab Results  Component Value Date   TESTOSTERONE  448 12/12/2023    Lab Results  Component Value Date   HGBA1C 5.1 10/15/2022    Urinalysis    Component Value Date/Time   COLORURINE DARK YELLOW 09/10/2020 1047   APPEARANCEUR Clear 12/03/2021 1104   LABSPEC 1.017 09/10/2020 1047   PHURINE 6.0 09/10/2020 1047   GLUCOSEU Negative 12/03/2021 1104   HGBUR NEGATIVE 09/10/2020 1047   BILIRUBINUR Negative 12/03/2021 1104   KETONESUR 1+ (A) 09/10/2020 1047   PROTEINUR Negative 12/03/2021 1104   PROTEINUR TRACE  (A) 09/10/2020 1047   NITRITE Negative 12/03/2021 1104   LEUKOCYTESUR Negative 12/03/2021 1104    Lab Results  Component Value Date   LABMICR See below: 05/14/2021   WBCUA None seen 05/14/2021   LABEPIT None seen 05/14/2021   MUCUS Present 10/02/2020   BACTERIA None seen 05/14/2021    Pertinent Imaging:   Assessment & Plan:    Low testosterone -status post Testopel .  Will check a T, hematocrit in 3 mo and same with PSA in 6 months.  No follow-ups on file.  Donnice Brooks, MD  [facility]  [address] [place], [place] [address] [phone]

## 2024-01-08 ENCOUNTER — Other Ambulatory Visit: Payer: Self-pay | Admitting: Cardiology

## 2024-01-09 ENCOUNTER — Encounter (INDEPENDENT_AMBULATORY_CARE_PROVIDER_SITE_OTHER): Payer: Self-pay | Admitting: Gastroenterology

## 2024-01-09 ENCOUNTER — Ambulatory Visit (INDEPENDENT_AMBULATORY_CARE_PROVIDER_SITE_OTHER): Payer: Medicare Other | Admitting: Gastroenterology

## 2024-01-09 VITALS — BP 115/74 | HR 67 | Temp 97.5°F | Ht 71.0 in | Wt 187.8 lb

## 2024-01-09 DIAGNOSIS — K219 Gastro-esophageal reflux disease without esophagitis: Secondary | ICD-10-CM | POA: Diagnosis not present

## 2024-01-09 DIAGNOSIS — R198 Other specified symptoms and signs involving the digestive system and abdomen: Secondary | ICD-10-CM | POA: Insufficient documentation

## 2024-01-09 MED ORDER — PANTOPRAZOLE SODIUM 40 MG PO TBEC: 40.0000 mg | DELAYED_RELEASE_TABLET | Freq: Every day | ORAL | 3 refills | Status: AC

## 2024-01-09 NOTE — Patient Instructions (Signed)
 We will continue with protonix  40mg  daily for now Be mindful of greasy, spicy, fried, citrus foods, caffeine, carbonated drinks, chocolate and alcohol as these can increase reflux symptoms Stay upright 2-3 hours after eating, prior to lying down and avoid eating late in the evenings.  You can continue metamucil as this is great for constipation Increase water  intake, aim for atleast 64 oz per day Increase fruits, veggies and whole grains, kiwi and prunes are especially good for constipation  Follow up 1 year

## 2024-01-09 NOTE — Progress Notes (Addendum)
 Referring Provider: Melvenia Manus BRAVO, MD Primary Care Physician:  Debera Jayson MATSU, MD Primary GI Physician: Dr. Eartha   Chief Complaint  Patient presents with   Follow-up    Patient here today for a follow up on GERD. Patient says he still has times the GERD gives him problems and feels that the medication is not working. Patient is taking pantoprazole  40 mg daily. Patient says he has a history of a hiatal hernia, that causes pain at times, with spasms. He says he sees chiropractor and they manipulate him and this gets better. Patient has a decreased appetite at times.   HPI:   Tim Rogers is a 68 y.o. male with past medical history of acid reflux, anxiety, ascending aortic aneurysm, low testosterone  and PVCs.    Patient presenting today for:  Follow up of GERD Constipation   Last seen November 2024, at that time GERD well managed with protonix  40mg  daily, had cut down to 20mg  daily but had breakthrough. Using hemorrhoid cream occasionally.  Recommended continue with protonix  40mg  daily, good reflux precautions, refill of anusol   Last labs in October with hgb 17.1   Present:  States he is doing well. He reports he had TIF in the past and the procedure failed. He notes that he sometimes has  epigastric discomfort at times that he thinks is from his hiatal hernia but states he sees his chiropractor for adjustments which seems to take care of this. He feels he is having some constipation since his last colonoscopy, nothing he has some issues getting started when he has a BM. Once he starts to go, he does not have any issues. He does put his feet up on a stool when going to the restroom. He drinks a lot of iced tea but also endorses decent water  intake but feels it could be better. Takes align probiotic and metamucil usually 1 scoop per day or chia seends. Feels that fruits and veggie intake is decent. Does endorse eating a lot of meat. He is still working out 3 days a week. No  red flag symptoms. Patient denies melena, hematochezia, nausea, vomiting, diarrhea, dysphagia, odyonophagia, early satiety or weight loss.   Last Colonoscopy:09/2022 - Three 2 to 6 mm polyps in the ascending colon and                            in the cecum, removed with a cold snare. Resected                            and retrieved.                           - One 1 mm polyp in the ascending colon, removed                            with a jumbo cold forceps. Resected and retrieved.                           - One 4 mm polyp in the transverse colon, removed                            with a cold snare. Resected and retrieved.                           -  Three 3 to 5 mm polyps in the rectum and in the                            descending colon, removed with a cold snare.                            Resected and retrieved.                           - Non-bleeding internal hemorrhoids. 5 polyps were tubular adenomas and 3 were hyperplastic polyps    Last Endoscopy:(2019) normal esophagus, z line irregular 39 cm from incisors, 2cm HH, suture at fundus from previous surgery, normal duodenal bulb and second portion of duodenum, no specimens collected. 2cm hh    Repeat Colonoscopy 3 years  Filed Weights   01/09/24 1138  Weight: 187 lb 12.8 oz (85.2 kg)     Past Medical History:  Diagnosis Date   Acid reflux    Allergy February   Anxiety    2003   Ascending aortic aneurysm    GERD (gastroesophageal reflux disease)    Low testosterone     PVCs (premature ventricular contractions)     Past Surgical History:  Procedure Laterality Date   CHOLECYSTECTOMY     COLONOSCOPY WITH PROPOFOL  N/A 10/01/2022   Procedure: COLONOSCOPY WITH PROPOFOL ;  Surgeon: Eartha Angelia Sieving, MD;  [facility];  Service: Gastroenterology;  Laterality: N/A;  8:15AM;ASA 1   ESOPHAGOGASTRODUODENOSCOPY N/A 09/21/2017   Procedure: ESOPHAGOGASTRODUODENOSCOPY (EGD);  Surgeon: Golda Claudis PENNER, MD;   [facility];  Service: Endoscopy;  Laterality: N/A;  2:55   POLYPECTOMY  10/01/2022   Procedure: POLYPECTOMY INTESTINAL;  Surgeon: Eartha Angelia, Sieving, MD;  [facility];  Service: Gastroenterology;;   TIF procedure      Current Outpatient Medications  Medication Sig Dispense Refill   amLODipine  (NORVASC ) 10 MG tablet TAKE 1 TABLET(10 MG) BY MOUTH DAILY 90 tablet 1   azelastine  (ASTELIN ) 0.1 % nasal spray Place 1 spray into both nostrils 2 (two) times daily. 30 mL 3   Cholecalciferol (VITAMIN D -3) 125 MCG (5000 UT) TABS Take 10,000 Units by mouth daily at 12 noon.     hydrocortisone  (ANUSOL -HC) 2.5 % rectal cream Place 1 Application rectally 2 (two) times daily. 56 g 2   loratadine (CLARITIN) 10 MG tablet Take 10 mg by mouth daily.     OVER THE COUNTER MEDICATION AG1 - one daily     pantoprazole  (PROTONIX ) 40 MG tablet TAKE 1 TABLET BY MOUTH EVERY DAY AS NEEDED 90 tablet 1   Testosterone  20 % CREA Apply 3 clicks topically qam. (Patient not taking: Reported on 12/19/2023) 100 g 2   valACYclovir (VALTREX) 1000 MG tablet Take 1,000 mg by mouth 2 (two) times daily.     No current facility-administered medications for this visit.    Allergies as of 01/09/2024 - Review Complete 01/09/2024  Allergen Reaction Noted   Lidocaine -epinephrine (pf) Shortness Of Breath, Anxiety, and Palpitations 03/18/2022    Social History   Socioeconomic History   Marital status: Single    Spouse name: Not on file   Number of children: 0   Years of education: 18   Highest education level: Master's degree (e.g., MA, MS, MEng, MEd, MSW, MBA)  Occupational History   Occupation: retired  Tobacco Use  Smoking status: Never   Smokeless tobacco: Former    Types: Snuff  Vaping Use   Vaping status: Never Used  Substance and Sexual Activity   Alcohol use: Yes    Alcohol/week: 2.0 standard drinks of alcohol    Types: 2 Cans of beer per week    Comment: couple of beer sometimes  daily   Drug use: No   Sexual activity: Not Currently    Birth control/protection: None  Other Topics Concern   Not on file  Social History Narrative   Retired.Single.Lives alone.   Ex-[employer].   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   Social Drivers of Corporate Investment Banker Strain: Low Risk  (11/23/2022)   Overall Financial Resource Strain (CARDIA)    Difficulty of Paying Living Expenses: Not hard at all  Food Insecurity: No Food Insecurity (11/23/2022)   Hunger Vital Sign    Worried About Running Out of Food in the Last Year: Never true    Ran Out of Food in the Last Year: Never true  Transportation Needs: No Transportation Needs (11/23/2022)   PRAPARE - Administrator, Civil Service (Medical): No    Lack of Transportation (Non-Medical): No  Physical Activity: Sufficiently Active (11/23/2022)   Exercise Vital Sign    Days of Exercise per Week: 5 days    Minutes of Exercise per Session: 90 min  Stress: No Stress Concern Present (11/23/2022)   [employer] of Occupational Health - Occupational Stress Questionnaire    Feeling of Stress : Not at all  Social Connections: Moderately Integrated (11/23/2022)   Social Connection and Isolation Panel    Frequency of Communication with Friends and Family: More than three times a week    Frequency of Social Gatherings with Friends and Family: More than three times a week    Attends Religious Services: More than 4 times per year    Active Member of [employer] or Organizations: Yes    Attends Engineer, Structural: More than 4 times per year    Marital Status: Never married    Review of systems General: negative for malaise, night sweats, fever, chills, weight loss Neck: Negative for lumps, goiter, pain and significant neck swelling Resp: Negative for cough, wheezing, dyspnea at rest CV: Negative for chest pain, leg swelling,  palpitations, orthopnea GI: denies melena, hematochezia, nausea, vomiting, diarrhea, dysphagia, odyonophagia, early satiety or unintentional weight loss. +constipation +epigastric discomfort MSK: Negative for joint pain or swelling, back pain, and muscle pain. Derm: Negative for itching or rash Psych: Denies depression, anxiety, memory loss, confusion. No homicidal or suicidal ideation.  Heme: Negative for prolonged bleeding, bruising easily, and swollen nodes. Endocrine: Negative for cold or heat intolerance, polyuria, polydipsia and goiter. Neuro: negative for tremor, gait imbalance, syncope and seizures. The remainder of the review of systems is noncontributory.  Physical Exam: BP (!) 147/81 (BP Location: Left Arm, Patient Position: Sitting, Cuff Size: Normal)   Pulse 72   Temp (!) 97.5 F (36.4 C) (Temporal)   Ht 5' 11 (1.803 m)   Wt 187 lb 12.8 oz (85.2 kg)   BMI 26.19 kg/m  General:   Alert and oriented. No distress noted. Pleasant and cooperative.  Head:  Normocephalic and atraumatic. Eyes:  Conjuctiva clear without scleral icterus. Mouth:  Oral mucosa pink and moist. Good dentition. No lesions. Heart: Normal rate and rhythm, s1  and s2 heart sounds present.  Lungs: Clear lung sounds in all lobes. Respirations equal and unlabored. Abdomen:  +BS, soft, non-tender and non-distended. No rebound or guarding. No HSM or masses noted. Derm: No palmar erythema or jaundice Msk:  Symmetrical without gross deformities. Normal posture. Extremities:  Without edema. Neurologic:  Alert and  oriented x4 Psych:  Alert and cooperative. Normal mood and affect.  Invalid input(s): 6 MONTHS   ASSESSMENT: Tim Rogers is a 68 y.o. male presenting today for follow up of GERD and Constipation  GERD: well managed with protonix  40mg  daily. Tried to decrease to 20mg  in the past and had breakthrough. Endorses some occasional epigastric discomfort but states he gets adjustments with his  chiropractor that seem to resolve this. No changes in appetite, weight loss, early satiety, rectal bleeding or melena. He inquires about safer alternatives to PPI therapy. Patient was re-assured regarding PPI use and safety of when appropriate indications in question. Most recent studies on PPI therapy that association of symptoms is not equivalent to causation and overall association with for example osteoporosis is weak and based on observational studies. When PPI use is indicated, it is safe to proceed with therapy and titrate dosing/use based on symptom response. I did offer to trial him on famotidine  though we discussed as this is a milder medication It may not control his symptoms. As he has no issues on PPI, he would like to remain on this for now.   Constipation: some harder stools at time of initiating a BM. Feels water  intake could be better. He takes metamucil or chia seeds usually daily and align probiotic. Having 1 Bm per day without rectal bleeding, melena, abdominal pain. Encouraged to increase water  intake, aim for 64 oz per day   PLAN:  -continue protonix  40mg  daily, good reflux precautions  -Increase water  intake, aim for atleast 64 oz per day -Increase fruits, veggies and whole grains, kiwi and prunes are especially good for constipation -continue metamucil/high fiber intake -consider H2B if patient decides he wants to try and get off of PPI therapy  All questions were answered, patient verbalized understanding and is in agreement with plan as outlined above.   Follow Up: 1 year   Tim Langenbach L. Mariette, MSN, APRN, AGNP-C Adult-Gerontology Nurse Practitioner [facility]  I have reviewed the note and agree with the APP's assessment as described in this progress note  Toribio Fortune, MD Gastroenterology and Hepatology [facility]

## 2024-01-20 ENCOUNTER — Ambulatory Visit (HOSPITAL_COMMUNITY)
Admission: RE | Admit: 2024-01-20 | Discharge: 2024-01-20 | Disposition: A | Discharge status: Home / Self Care | Source: Ambulatory Visit | Attending: Cardiology | Admitting: Cardiology

## 2024-01-20 ENCOUNTER — Encounter (HOSPITAL_COMMUNITY): Payer: Self-pay

## 2024-01-20 DIAGNOSIS — I10 Essential (primary) hypertension: Secondary | ICD-10-CM | POA: Diagnosis present

## 2024-01-20 DIAGNOSIS — I77819 Aortic ectasia, unspecified site: Secondary | ICD-10-CM | POA: Insufficient documentation

## 2024-01-20 DIAGNOSIS — I7121 Aneurysm of the ascending aorta, without rupture: Secondary | ICD-10-CM | POA: Insufficient documentation

## 2024-01-20 MED ORDER — IOHEXOL 350 MG/ML SOLN
100.0000 mL | Freq: Once | INTRAVENOUS | Status: AC | PRN
  Administered 2024-01-20: 75 mL via INTRAVENOUS

## 2024-01-29 ENCOUNTER — Ambulatory Visit: Payer: Self-pay | Admitting: Cardiology

## 2024-02-06 ENCOUNTER — Encounter: Payer: Self-pay | Admitting: Cardiology

## 2024-02-06 ENCOUNTER — Ambulatory Visit

## 2024-02-06 ENCOUNTER — Ambulatory Visit: Attending: Cardiology | Admitting: Cardiology

## 2024-02-06 VITALS — BP 115/68 | HR 80 | Ht 71.0 in | Wt 185.8 lb

## 2024-02-06 VITALS — BP 115/68 | Ht 71.0 in | Wt 185.0 lb

## 2024-02-06 DIAGNOSIS — Z0000 Encounter for general adult medical examination without abnormal findings: Secondary | ICD-10-CM

## 2024-02-06 DIAGNOSIS — I351 Nonrheumatic aortic (valve) insufficiency: Secondary | ICD-10-CM

## 2024-02-06 DIAGNOSIS — I77819 Aortic ectasia, unspecified site: Secondary | ICD-10-CM | POA: Diagnosis not present

## 2024-02-06 DIAGNOSIS — I10 Essential (primary) hypertension: Secondary | ICD-10-CM

## 2024-02-06 NOTE — Progress Notes (Signed)
 Chief Complaint  Patient presents with   Medicare Wellness      Subjective:   Tim Rogers is a 68 y.o. male who presents for a Medicare Annual Wellness Visit.  Visit info / Clinical Intake: Medicare Wellness Visit Type:: Subsequent Annual Wellness Visit Persons participating in visit and providing information:: patient Medicare Wellness Visit Mode:: Telephone If telephone:: video declined Since this visit was completed virtually, some vitals may be partially provided or unavailable. Missing vitals are due to the limitations of the virtual format.: Documented vitals are patient reported If Telephone or Video please confirm:: I connected with patient using audio/video enable telemedicine. I verified patient identity with two identifiers, discussed telehealth limitations, and patient agreed to proceed. Patient Location:: home Provider Location:: office Interpreter Needed?: No Pre-visit prep was completed: yes AWV questionnaire completed by patient prior to visit?: yes Date:: 01/30/24 Living arrangements:: (!) lives alone Patient's Overall Health Status Rating: excellent Typical amount of pain: none Does pain affect daily life?: no Are you currently prescribed opioids?: no  Dietary Habits and Nutritional Risks How many meals a day?: 3 Eats fruit and vegetables daily?: yes Most meals are obtained by: preparing own meals In the last 2 weeks, have you had any of the following?: none Diabetic:: no  Functional Status Activities of Daily Living (to include ambulation/medication): (Patient-Rptd) Independent Ambulation: (Patient-Rptd) Independent Medication Administration: Independent Home Management (perform basic housework or laundry): (Patient-Rptd) Independent Manage your own finances?: yes Primary transportation is: driving Concerns about vision?: no *vision screening is required for WTM* Concerns about hearing?: no  Fall Screening Falls in the past year?:  (Patient-Rptd) 0 Number of falls in past year: (Patient-Rptd) 0 Was there an injury with Fall?: (Patient-Rptd) 0 Fall Risk Category Calculator: (Patient-Rptd) 0 Patient Fall Risk Level: (Patient-Rptd) Low Fall Risk  Fall Risk Patient at Risk for Falls Due to: No Fall Risks Fall risk Follow up: Falls evaluation completed; Education provided; Falls prevention discussed  Home and Transportation Safety: All rugs have non-skid backing?: yes All stairs or steps have railings?: yes Grab bars in the bathtub or shower?: (!) no Have non-skid surface in bathtub or shower?: yes Good home lighting?: yes Regular seat belt use?: yes Hospital stays in the last year:: no  Cognitive Assessment Difficulty concentrating, remembering, or making decisions? : no Will 6CIT or Mini Cog be Completed: no 6CIT or Mini Cog Declined: patient alert, oriented, able to answer questions appropriately and recall recent events  Advance Directives (For Healthcare) Does Patient Have a Medical Advance Directive?: No Would patient like information on creating a medical advance directive?: No - Patient declined  Reviewed/Updated  Reviewed/Updated: Reviewed All (Medical, Surgical, Family, Medications, Allergies, Care Teams, Patient Goals)    Allergies (verified) Lidocaine -epinephrine (pf)   Current Medications (verified) Outpatient Encounter Medications as of 02/06/2024  Medication Sig   amLODipine  (NORVASC ) 10 MG tablet TAKE 1 TABLET(10 MG) BY MOUTH DAILY   azelastine  (ASTELIN ) 0.1 % nasal spray Place 1 spray into both nostrils 2 (two) times daily.   Cholecalciferol (VITAMIN D -3) 125 MCG (5000 UT) TABS Take 10,000 Units by mouth daily at 12 noon.   hydrocortisone  (ANUSOL -HC) 2.5 % rectal cream Place 1 Application rectally 2 (two) times daily. (Patient taking differently: Place 1 Application rectally as needed.)   loratadine (CLARITIN) 10 MG tablet Take 10 mg by mouth daily.   OVER THE COUNTER MEDICATION AG1 - one  daily   pantoprazole  (PROTONIX ) 40 MG tablet Take 1 tablet (40 mg total) by mouth  daily.   valACYclovir (VALTREX) 1000 MG tablet Take 1,000 mg by mouth 2 (two) times daily.   No facility-administered encounter medications on file as of 02/06/2024.    History: Past Medical History:  Diagnosis Date   Acid reflux    Allergy February   Anxiety    2003   Ascending aortic aneurysm    GERD (gastroesophageal reflux disease)    Low testosterone     PVCs (premature ventricular contractions)    Past Surgical History:  Procedure Laterality Date   CHOLECYSTECTOMY     COLONOSCOPY WITH PROPOFOL  N/A 10/01/2022   Procedure: COLONOSCOPY WITH PROPOFOL ;  Surgeon: Eartha Angelia Sieving, MD;  [facility];  Service: Gastroenterology;  Laterality: N/A;  8:15AM;ASA 1   ESOPHAGOGASTRODUODENOSCOPY N/A 09/21/2017   Procedure: ESOPHAGOGASTRODUODENOSCOPY (EGD);  Surgeon: Golda Claudis PENNER, MD;  Location: [facility];  Service: Endoscopy;  Laterality: N/A;  2:55   POLYPECTOMY  10/01/2022   Procedure: POLYPECTOMY INTESTINAL;  Surgeon: Eartha Angelia Sieving, MD;  Location: [facility];  Service: Gastroenterology;;   TIF procedure     Family History  Problem Relation Age of Onset   Alcohol abuse Mother    Cancer Mother        lung   COPD Mother    Heart disease Mother    Early death Mother        age 64 multiple factors   Cancer Sister        breast   Social History   Occupational History   Occupation: retired  Tobacco Use   Smoking status: Never   Smokeless tobacco: Former    Types: Snuff  Vaping Use   Vaping status: Never Used  Substance and Sexual Activity   Alcohol use: Yes    Alcohol/week: 2.0 standard drinks of alcohol    Comment: couple of beer sometimes daily   Drug use: No   Sexual activity: Not Currently    Birth control/protection: None   Tobacco Counseling Counseling given: Yes  SDOH Screenings   Food Insecurity: No Food Insecurity (01/30/2024)   Housing: Low Risk  (01/30/2024)  Transportation Needs: No Transportation Needs (01/30/2024)  Utilities: Not At Risk (02/06/2024)  Alcohol Screen: Low Risk  (11/23/2022)  Depression (PHQ2-9): Low Risk  (02/06/2024)  Financial Resource Strain: Low Risk  (01/30/2024)  Physical Activity: Sufficiently Active (02/06/2024)  Social Connections: Moderately Integrated (01/30/2024)  Stress: No Stress Concern Present (01/30/2024)  Tobacco Use: Medium Risk (02/06/2024)  Health Literacy: Adequate Health Literacy (02/06/2024)   See flowsheets for full screening details  Depression Screen PHQ 2 & 9 Depression Scale- Over the past 2 weeks, how often have you been bothered by any of the following problems? Little interest or pleasure in doing things: 0 Feeling down, depressed, or hopeless (PHQ Adolescent also includes...irritable): 0 PHQ-2 Total Score: 0     Goals Addressed               This Visit's Progress     Remain active and healthy (pt-stated)               Objective:    Today's Vitals   02/06/24 1157  BP: 115/68  Weight: 185 lb (83.9 kg)  Height: 5' 11 (1.803 m)   Body mass index is 25.8 kg/m.  Hearing/Vision screen Hearing Screening - Comments:: Patient denies any hearing difficulties.   Vision Screening - Comments:: Wears rx glasses - up to date with routine eye exams with  Oneil Kawasaki at [facility]  [place] location Immunizations and Health Maintenance Health Maintenance  Topic Date Due   Zoster Vaccines- Shingrix (2 of 2) 01/29/2017   Pneumococcal Vaccine: 50+ Years (3 of 3 - PCV20 or PCV21) 12/04/2021   Influenza Vaccine  09/30/2023   COVID-19 Vaccine (4 - 2025-26 season) 10/31/2023   Medicare Annual Wellness (AWV)  11/29/2023   Colonoscopy  09/30/2025   DTaP/Tdap/Td (2 - Td or Tdap) 08/19/2029   Hepatitis C Screening  Completed   Meningococcal B Vaccine  Aged Out        Assessment/Plan:  This is a routine wellness examination for Beacher.  Patient Care  Team: System, Provider Not In as PCP - General Debera Jayson MATSU, MD as PCP - Cardiology (Cardiology) Darroll Anes, DO [facility])  I have personally reviewed and noted the following in the patient's chart:   Medical and social history Use of alcohol, tobacco or illicit drugs  Current medications and supplements including opioid prescriptions. Functional ability and status Nutritional status Physical activity Advanced directives List of other physicians Hospitalizations, surgeries, and ER visits in previous 12 months Vitals Screenings to include cognitive, depression, and falls Referrals and appointments  No orders of the defined types were placed in this encounter.  In addition, I have reviewed and discussed with patient certain preventive protocols, quality metrics, and best practice recommendations. A written personalized care plan for preventive services as well as general preventive health recommendations were provided to patient.   Kelsei Defino, CMA   02/06/2024   Return February 05, 2025 at 8:00 am, for your yearly Medicare Wellness Visit in person.  After Visit Summary: (MyChart) Due to this being a telephonic visit, the after visit summary with patients personalized plan was offered to patient via MyChart   Nurse Notes: patient scheduled for a transfer of care appt with Leita Longs NP in March 2026

## 2024-02-06 NOTE — Patient Instructions (Addendum)
 [name],  Thank you for taking the time for your Medicare Wellness Visit. I appreciate your continued commitment to your health goals. Please review the care plan we discussed, and feel free to reach out if I can assist you further.  Please note that Annual Wellness Visits do not include a physical exam. Some assessments may be limited, especially if the visit was conducted virtually. If needed, we may recommend an in-person follow-up with your provider.  Ongoing Care Seeing your primary care provider every 3 to 6 months helps us  monitor your health and provide consistent, personalized care.   Next office visit with primary care provider: Friday May 18, 2024 at 9:20am am to establish with Leita Longs, NP  1 year follow up for Medicare well visit: February 05, 2025 at 8:00am with medicare wellness nurse in office  Referrals If a referral was made during today's visit and you haven't received any updates within two weeks, please contact the referred provider directly to check on the status.  Recommended Screenings:  Health Maintenance  Topic Date Due   Zoster (Shingles) Vaccine (2 of 2) 01/29/2017   Pneumococcal Vaccine for age over 34 (3 of 3 - PCV20 or PCV21) 12/04/2021   Flu Shot  09/30/2023   COVID-19 Vaccine (4 - 2025-26 season) 10/31/2023   Medicare Annual Wellness Visit  11/29/2023   Colon Cancer Screening  09/30/2025   DTaP/Tdap/Td vaccine (2 - Td or Tdap) 08/19/2029   Hepatitis C Screening  Completed   Meningitis B Vaccine  Aged Out       01/30/2024   10:59 AM  Advanced Directives  Does Patient Have a Medical Advance Directive? No  Would patient like information on creating a medical advance directive? No - Patient declined    Vision: Annual vision screenings are recommended for early detection of glaucoma, cataracts, and diabetic retinopathy. These exams can also reveal signs of chronic conditions such as diabetes and high blood pressure.  Dental: Annual  dental screenings help detect early signs of oral cancer, gum disease, and other conditions linked to overall health, including heart disease and diabetes.  Please see the attached documents for additional preventive care recommendations.

## 2024-02-06 NOTE — Patient Instructions (Addendum)
 Medication Instructions:  Your physician recommends that you continue on your current medications as directed. Please refer to the Current Medication list given to you today.  Labwork: none  Testing/Procedures: Your physician has requested that you have an echocardiogram in November 2026. Echocardiography is a painless test that uses sound waves to create images of your heart. It provides your doctor with information about the size and shape of your heart and how well your heart's chambers and valves are working. This procedure takes approximately one hour. There are no restrictions for this procedure. Please do NOT wear cologne, perfume, aftershave, or lotions (deodorant is allowed). Please arrive 15 minutes prior to your appointment time.  Please note: We ask at that you not bring children with you during ultrasound (echo/ vascular) testing. Due to room size and safety concerns, children are not allowed in the ultrasound rooms during exams. Our front office staff cannot provide observation of children in our lobby area while testing is being conducted. An adult accompanying a patient to their appointment will only be allowed in the ultrasound room at the discretion of the ultrasound technician under special circumstances. We apologize for any inconvenience. Chest CTA due in November 2026  Follow-Up: Your physician recommends that you schedule a follow-up appointment in: 1 year. You will receive a reminder call in about 8-10 months reminding you to schedule your appointment. If you don't receive this call, please contact our office.  Any Other Special Instructions Will Be Listed Below (If Applicable).  If you need a refill on your cardiac medications before your next appointment, please call your pharmacy.

## 2024-02-06 NOTE — Progress Notes (Signed)
    Cardiology Office Note  Date: 02/06/2024   ID: Tim Rogers, DOB 1956/01/25, MRN [id number]  History of Present Illness: Tim Rogers is a 68 y.o. male last seen in November 2024.  He is here for a routine visit.  Continues to exercise at the gym on Mondays, Wednesdays, and Fridays.  Typically plays golf on Tuesdays and Thursdays.  He does not report any specific change in stamina, no new exertional limitations.  We discussed his follow-up chest CTA from November, stable ascending aortic dilatation measuring 4.3 cm.  Anticipate annual imaging and we will also update his echocardiogram next year.  I reviewed his ECG today which shows normal sinus rhythm with right bundle branch block and leftward axis.  Blood pressure is well-controlled today on Norvasc  10 mg daily.  Physical Exam: VS:  BP 115/68   Pulse 80   Ht 5' 11 (1.803 m)   Wt 185 lb 12.8 oz (84.3 kg)   SpO2 98%   BMI 25.91 kg/m , BMI Body mass index is 25.91 kg/m.  Wt Readings from Last 3 Encounters:  02/06/24 185 lb 12.8 oz (84.3 kg)  01/09/24 187 lb 12.8 oz (85.2 kg)  01/26/23 183 lb (83 kg)    General: Patient appears comfortable at rest. HEENT: Conjunctiva and lids normal. Neck: Supple, no elevated JVP or carotid bruits. Lungs: Clear to auscultation, nonlabored breathing at rest. Cardiac: Regular rate and rhythm, no S3 or significant systolic murmur, no pericardial rub. Abdomen: Soft, bowel sounds present.  ECG:  An ECG dated 01/26/2023 was personally reviewed today and demonstrated:  Sinus rhythm with left anterior fascicular block and right bundle branch block.  Labwork: August 2025: BUN 17, creatinine 1.18, potassium 4.2, AST 23, ALT 17, TSH 1.82, hemoglobin 16.7, platelets 245, cholesterol 162, triglycerides 74, HDL 38, LDL 110 12/12/2023: Hemoglobin 17.1     Component Value Date/Time   CHOL 162 10/15/2022 0844   TRIG 74 10/15/2022 0844   HDL 38 (L) 10/15/2022 0844   CHOLHDL 4.3 10/15/2022  0844   LDLCALC 110 (H) 10/15/2022 0844   Other Studies Reviewed Today:  Chest CT 01/20/2024: IMPRESSION: 1. Stable dilation of the ascending thoracic aorta when compared to the most recent prior exam as detailed above. Maximum diameter of the ascending portion measures 4.3 cm. Recommend annual imaging followup by CTA or MRA. This recommendation follows 2010 ACCF/AHA/AATS/ACR/ASA/SCA/SCAI/SIR/STS/SVM Guidelines for the Diagnosis and Management of Patients with Thoracic Aortic Disease. Circulation. 2010; 121: Z733-z630. Aortic aneurysm NOS (ICD10-I71.9) 2. No acute findings.  Assessment and Plan:  1.  Asymptomatic ascending aortic dilatation, 4.3 cm by follow-up chest CTA this November.  Plan to reimage in 1 year.  Blood pressure well-controlled today on current regimen.   2.  Mild aortic regurgitation by echocardiogram in 2021, no significant murmur on examination.  Aortic valve trileaflet.  Will update echocardiogram next year for surveillance.   3.  Primary hypertension.  Blood pressure well-controlled today on Norvasc  10 mg daily.  Disposition:  Follow up 1 year.  Signed, Jayson JUDITHANN Sierras, M.D., F.A.C.C. [facility]

## 2024-03-26 ENCOUNTER — Other Ambulatory Visit

## 2024-03-29 ENCOUNTER — Other Ambulatory Visit

## 2024-03-29 DIAGNOSIS — E291 Testicular hypofunction: Secondary | ICD-10-CM

## 2024-03-29 DIAGNOSIS — N400 Enlarged prostate without lower urinary tract symptoms: Secondary | ICD-10-CM

## 2024-03-29 DIAGNOSIS — I77819 Aortic ectasia, unspecified site: Secondary | ICD-10-CM

## 2024-03-30 ENCOUNTER — Ambulatory Visit: Payer: Self-pay

## 2024-03-30 LAB — PSA: Prostate Specific Ag, Serum: 1 ng/mL (ref 0.0–4.0)

## 2024-03-30 LAB — TESTOSTERONE: Testosterone: 856 ng/dL (ref 264–916)

## 2024-03-30 LAB — HEMOGLOBIN AND HEMATOCRIT, BLOOD
Hematocrit: 50.9 % (ref 37.5–51.0)
Hemoglobin: 17 g/dL (ref 13.0–17.7)

## 2024-04-06 ENCOUNTER — Other Ambulatory Visit: Payer: Self-pay | Admitting: Cardiology

## 2024-05-18 ENCOUNTER — Encounter

## 2025-01-07 ENCOUNTER — Ambulatory Visit

## 2025-02-05 ENCOUNTER — Ambulatory Visit

## 2025-02-12 ENCOUNTER — Ambulatory Visit
